# Patient Record
Sex: Male | Born: 1970 | Race: White | Hispanic: No | Marital: Married | Smoking: Never smoker
Health system: Southern US, Community
[De-identification: ages and names within clinical notes are randomized; demographics above are authoritative.]

## PROBLEM LIST (undated history)

## (undated) DIAGNOSIS — M5126 Other intervertebral disc displacement, lumbar region: Secondary | ICD-10-CM

## (undated) DIAGNOSIS — H9190 Unspecified hearing loss, unspecified ear: Secondary | ICD-10-CM

## (undated) DIAGNOSIS — I10 Essential (primary) hypertension: Secondary | ICD-10-CM

## (undated) DIAGNOSIS — I509 Heart failure, unspecified: Secondary | ICD-10-CM

## (undated) DIAGNOSIS — K529 Noninfective gastroenteritis and colitis, unspecified: Secondary | ICD-10-CM

## (undated) DIAGNOSIS — I2510 Atherosclerotic heart disease of native coronary artery without angina pectoris: Secondary | ICD-10-CM

## (undated) DIAGNOSIS — E119 Type 2 diabetes mellitus without complications: Secondary | ICD-10-CM

## (undated) DIAGNOSIS — I219 Acute myocardial infarction, unspecified: Secondary | ICD-10-CM

## (undated) DIAGNOSIS — Z8781 Personal history of (healed) traumatic fracture: Secondary | ICD-10-CM

## (undated) DIAGNOSIS — E785 Hyperlipidemia, unspecified: Secondary | ICD-10-CM

## (undated) DIAGNOSIS — Z860100 Personal history of colon polyps, unspecified: Secondary | ICD-10-CM

## (undated) HISTORY — DX: Hyperlipidemia, unspecified: E78.5

## (undated) HISTORY — PX: CARDIAC CATHETERIZATION: SHX172

## (undated) HISTORY — DX: Personal history of (healed) traumatic fracture: Z87.81

## (undated) HISTORY — PX: NO PAST SURGERIES: SHX2092

## (undated) HISTORY — DX: Personal history of colonic polyps: Z86.010

## (undated) HISTORY — DX: Acute myocardial infarction, unspecified: I21.9

## (undated) HISTORY — DX: Personal history of colon polyps, unspecified: Z86.0100

## (undated) HISTORY — PX: COLONOSCOPY: SHX174

## (undated) HISTORY — DX: Unspecified hearing loss, unspecified ear: H91.90

## (undated) HISTORY — DX: Other intervertebral disc displacement, lumbar region: M51.26

## (undated) HISTORY — DX: Noninfective gastroenteritis and colitis, unspecified: K52.9

---

## 2013-08-04 ENCOUNTER — Other Ambulatory Visit: Payer: Self-pay | Admitting: Family Medicine

## 2013-08-04 ENCOUNTER — Encounter: Payer: Self-pay | Admitting: Family Medicine

## 2013-08-04 ENCOUNTER — Ambulatory Visit (INDEPENDENT_AMBULATORY_CARE_PROVIDER_SITE_OTHER): Payer: BC Managed Care – PPO | Admitting: Family Medicine

## 2013-08-04 VITALS — BP 122/78 | HR 78 | Temp 97.8°F | Resp 18 | Ht 72.0 in | Wt 240.0 lb

## 2013-08-04 DIAGNOSIS — L989 Disorder of the skin and subcutaneous tissue, unspecified: Secondary | ICD-10-CM

## 2013-08-04 DIAGNOSIS — E669 Obesity, unspecified: Secondary | ICD-10-CM

## 2013-08-04 DIAGNOSIS — E785 Hyperlipidemia, unspecified: Secondary | ICD-10-CM

## 2013-08-04 LAB — LIPID PANEL
Cholesterol: 160 mg/dL (ref 0–200)
Total CHOL/HDL Ratio: 4 Ratio
Triglycerides: 155 mg/dL — ABNORMAL HIGH (ref <150)

## 2013-08-04 LAB — COMPREHENSIVE METABOLIC PANEL
AST: 30 U/L (ref 0–37)
Albumin: 4.4 g/dL (ref 3.5–5.2)
Alkaline Phosphatase: 52 U/L (ref 39–117)
BUN: 16 mg/dL (ref 6–23)
CO2: 28 mEq/L (ref 19–32)
Calcium: 9.6 mg/dL (ref 8.4–10.5)
Chloride: 106 mEq/L (ref 96–112)
Creat: 1.25 mg/dL (ref 0.50–1.35)
Glucose, Bld: 103 mg/dL — ABNORMAL HIGH (ref 70–99)
Potassium: 4.5 mEq/L (ref 3.5–5.3)
Sodium: 142 mEq/L (ref 135–145)
Total Protein: 6.7 g/dL (ref 6.0–8.3)

## 2013-08-04 LAB — CBC WITH DIFFERENTIAL/PLATELET
Basophils Relative: 1 % (ref 0–1)
Eosinophils Absolute: 0.1 10*3/uL (ref 0.0–0.7)
Hemoglobin: 14.4 g/dL (ref 13.0–17.0)
Lymphs Abs: 3.2 10*3/uL (ref 0.7–4.0)
MCH: 27.7 pg (ref 26.0–34.0)
MCHC: 34.3 g/dL (ref 30.0–36.0)
MCV: 80.9 fL (ref 78.0–100.0)
Monocytes Relative: 7 % (ref 3–12)
Neutro Abs: 2.2 10*3/uL (ref 1.7–7.7)
Neutrophils Relative %: 37 % — ABNORMAL LOW (ref 43–77)
RBC: 5.19 MIL/uL (ref 4.22–5.81)
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
## 2013-08-04 NOTE — Assessment & Plan Note (Signed)
He has current work out routine , continue to encourage healthy living  Return for CPE, would obtain EKG with risk factors of family, cholesterol and weight

## 2013-08-04 NOTE — Patient Instructions (Addendum)
Release of records previous PCP  We will cal with lab results Continue current medications F/U 6 months for Physical

## 2013-08-04 NOTE — Assessment & Plan Note (Signed)
Benign appearing, scabbed lesion, will monitor has been present many years if any growth or change would biopsy

## 2013-08-04 NOTE — Assessment & Plan Note (Signed)
Continue current meds Check FLP and CMET today 90 day supply to be sent pending results

## 2013-08-05 ENCOUNTER — Encounter: Payer: Self-pay | Admitting: Family Medicine

## 2013-08-05 DIAGNOSIS — R7301 Impaired fasting glucose: Secondary | ICD-10-CM | POA: Insufficient documentation

## 2013-08-05 LAB — HEMOGLOBIN A1C
Hgb A1c MFr Bld: 6.5 % — ABNORMAL HIGH (ref <5.7)
Mean Plasma Glucose: 140 mg/dL — ABNORMAL HIGH (ref <117)

## 2013-08-06 MED ORDER — SIMVASTATIN 10 MG PO TABS: 10.0000 mg | ORAL_TABLET | Freq: Every day | ORAL | Status: DC

## 2013-08-06 MED ORDER — FENOFIBRATE 48 MG PO TABS: 48.0000 mg | ORAL_TABLET | Freq: Every day | ORAL | Status: DC

## 2013-08-06 NOTE — Addendum Note (Signed)
Addended by: Milinda Antis F on: 08/06/2013 01:55 PM   Modules accepted: Orders

## 2013-10-13 ENCOUNTER — Encounter: Payer: Self-pay | Admitting: Family Medicine

## 2013-10-13 ENCOUNTER — Ambulatory Visit (INDEPENDENT_AMBULATORY_CARE_PROVIDER_SITE_OTHER): Payer: BC Managed Care – PPO | Admitting: Family Medicine

## 2013-10-13 VITALS — BP 120/88 | HR 78 | Temp 98.1°F | Resp 18 | Ht 71.0 in | Wt 239.0 lb

## 2013-10-13 DIAGNOSIS — E119 Type 2 diabetes mellitus without complications: Secondary | ICD-10-CM | POA: Insufficient documentation

## 2013-10-13 DIAGNOSIS — R7309 Other abnormal glucose: Secondary | ICD-10-CM

## 2013-10-13 DIAGNOSIS — E785 Hyperlipidemia, unspecified: Secondary | ICD-10-CM

## 2013-10-13 DIAGNOSIS — R7303 Prediabetes: Secondary | ICD-10-CM

## 2013-10-13 LAB — HEMOGLOBIN A1C, FINGERSTICK: Hgb A1C (fingerstick): 5.9 % — ABNORMAL HIGH (ref <5.7)

## 2013-10-13 NOTE — Assessment & Plan Note (Signed)
Triglycerides are much improved. He did have minimal elevation in his ALT are recheck his liver function test today

## 2013-10-13 NOTE — Assessment & Plan Note (Signed)
Repeat A1c was down to 5.9%. He will continue to work on his diet. No medications are needed at this time.

## 2013-10-13 NOTE — Progress Notes (Signed)
   Subjective:    Patient ID: James Francis, male    DOB: 27-May-1971, 42 y.o.   MRN: [id number]  HPI  Patient here to followup blood sugar. He was found to have elevated fasting glucose at her last her last A1c was done which showed 6.5% value. He's been watching his diet and cutting back on carbs and sugar intake. He has no specific concerns today. His triglycerides are much improved at 155. He had minimal elevation in his ALT  Review of Systems - per above  GEN- denies fatigue, fever, weight loss,weakness, recent illness HEENT- denies eye drainage, change in vision, nasal discharge, CVS- denies chest pain, palpitations RESP- denies SOB, cough, wheezery Neuro- denies headache, dizziness, syncope, seizure activity       Objective:   Physical Exam GEN-NAD,alert and oriented x 3        Assessment & Plan:

## 2013-10-13 NOTE — Patient Instructions (Signed)
Continue to watch your diet, low sugar, low carb Your A1C today improved from 6.5% to 5.9% We will send a letter with the liver labs if normal F/U as previous

## 2013-10-14 LAB — COMPREHENSIVE METABOLIC PANEL
ALT: 54 U/L — ABNORMAL HIGH (ref 0–53)
Albumin: 4.4 g/dL (ref 3.5–5.2)
CO2: 27 mEq/L (ref 19–32)
Glucose, Bld: 67 mg/dL — ABNORMAL LOW (ref 70–99)
Potassium: 4.3 mEq/L (ref 3.5–5.3)
Sodium: 139 mEq/L (ref 135–145)
Total Bilirubin: 0.7 mg/dL (ref 0.3–1.2)
Total Protein: 6.5 g/dL (ref 6.0–8.3)

## 2013-10-16 ENCOUNTER — Encounter: Payer: Self-pay | Admitting: *Deleted

## 2014-02-02 ENCOUNTER — Ambulatory Visit (INDEPENDENT_AMBULATORY_CARE_PROVIDER_SITE_OTHER): Payer: 59 | Admitting: Family Medicine

## 2014-02-02 ENCOUNTER — Encounter: Payer: Self-pay | Admitting: Family Medicine

## 2014-02-02 VITALS — BP 136/72 | HR 78 | Temp 97.8°F | Resp 14 | Ht 71.0 in | Wt 239.0 lb

## 2014-02-02 DIAGNOSIS — E669 Obesity, unspecified: Secondary | ICD-10-CM

## 2014-02-02 DIAGNOSIS — Z0000 Encounter for general adult medical examination without abnormal findings: Secondary | ICD-10-CM

## 2014-02-02 DIAGNOSIS — M25562 Pain in left knee: Secondary | ICD-10-CM | POA: Insufficient documentation

## 2014-02-02 DIAGNOSIS — E785 Hyperlipidemia, unspecified: Secondary | ICD-10-CM

## 2014-02-02 DIAGNOSIS — M25569 Pain in unspecified knee: Secondary | ICD-10-CM

## 2014-02-02 DIAGNOSIS — Z23 Encounter for immunization: Secondary | ICD-10-CM

## 2014-02-02 MED ORDER — FENOFIBRATE 48 MG PO TABS: 48.0000 mg | ORAL_TABLET | Freq: Every day | ORAL | Status: DC

## 2014-02-02 MED ORDER — SIMVASTATIN 10 MG PO TABS: 10.0000 mg | ORAL_TABLET | Freq: Every day | ORAL | Status: DC

## 2014-02-02 NOTE — Patient Instructions (Signed)
I recommend eye visit once a year I recommend dental visit every 6 months Goal is to  Exercise 30 minutes 5 days a week We will send a letter with lab results  Fax me a copy of labs [phone]  TDAP given today  F/U 6 months

## 2014-02-02 NOTE — Assessment & Plan Note (Signed)
No red flags, have him take OTC NSAIDS, hold on imaging unless no improvement

## 2014-02-02 NOTE — Assessment & Plan Note (Signed)
Obtain labs from his biometric screening

## 2014-02-02 NOTE — Assessment & Plan Note (Signed)
Continue to work on diet and exercise
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
## 2014-08-05 ENCOUNTER — Ambulatory Visit: Payer: 59 | Admitting: Family Medicine

## 2014-08-12 ENCOUNTER — Ambulatory Visit (INDEPENDENT_AMBULATORY_CARE_PROVIDER_SITE_OTHER): Payer: 59 | Admitting: Family Medicine

## 2014-08-12 ENCOUNTER — Encounter: Payer: Self-pay | Admitting: Family Medicine

## 2014-08-12 VITALS — BP 126/72 | HR 64 | Temp 98.0°F | Resp 12 | Ht 72.0 in | Wt 238.0 lb

## 2014-08-12 DIAGNOSIS — E785 Hyperlipidemia, unspecified: Secondary | ICD-10-CM

## 2014-08-12 DIAGNOSIS — M5126 Other intervertebral disc displacement, lumbar region: Secondary | ICD-10-CM

## 2014-08-12 DIAGNOSIS — M5136 Other intervertebral disc degeneration, lumbar region: Secondary | ICD-10-CM | POA: Insufficient documentation

## 2014-08-12 DIAGNOSIS — B351 Tinea unguium: Secondary | ICD-10-CM | POA: Insufficient documentation

## 2014-08-12 DIAGNOSIS — R7303 Prediabetes: Secondary | ICD-10-CM

## 2014-08-12 DIAGNOSIS — R7309 Other abnormal glucose: Secondary | ICD-10-CM

## 2014-08-12 LAB — LIPID PANEL
CHOLESTEROL: 184 mg/dL (ref 0–200)
HDL: 47 mg/dL (ref >39)
LDL Cholesterol: 76 mg/dL (ref 0–99)
Total CHOL/HDL Ratio: 3.9 Ratio
Triglycerides: 303 mg/dL — ABNORMAL HIGH (ref <150)
VLDL: 61 mg/dL — ABNORMAL HIGH (ref 0–40)

## 2014-08-12 LAB — CBC WITH DIFFERENTIAL/PLATELET
BASOS ABS: 0 10*3/uL (ref 0.0–0.1)
Basophils Relative: 0 % (ref 0–1)
Eosinophils Absolute: 0.1 10*3/uL (ref 0.0–0.7)
Eosinophils Relative: 2 % (ref 0–5)
HEMATOCRIT: 42.6 % (ref 39.0–52.0)
Hemoglobin: 14.4 g/dL (ref 13.0–17.0)
LYMPHS PCT: 51 % — AB (ref 12–46)
Lymphs Abs: 3.5 10*3/uL (ref 0.7–4.0)
MCH: 28 pg (ref 26.0–34.0)
MCHC: 33.8 g/dL (ref 30.0–36.0)
MCV: 82.7 fL (ref 78.0–100.0)
MONO ABS: 0.5 10*3/uL (ref 0.1–1.0)
Monocytes Relative: 7 % (ref 3–12)
Neutro Abs: 2.7 10*3/uL (ref 1.7–7.7)
Neutrophils Relative %: 40 % — ABNORMAL LOW (ref 43–77)
PLATELETS: 202 10*3/uL (ref 150–400)
RBC: 5.15 MIL/uL (ref 4.22–5.81)
RDW: 14.3 % (ref 11.5–15.5)
WBC: 6.8 10*3/uL (ref 4.0–10.5)

## 2014-08-12 LAB — COMPREHENSIVE METABOLIC PANEL
ALT: 28 U/L (ref 0–53)
AST: 21 U/L (ref 0–37)
Albumin: 4.3 g/dL (ref 3.5–5.2)
Alkaline Phosphatase: 47 U/L (ref 39–117)
BUN: 18 mg/dL (ref 6–23)
CALCIUM: 9.8 mg/dL (ref 8.4–10.5)
CO2: 27 mEq/L (ref 19–32)
CREATININE: 1.24 mg/dL (ref 0.50–1.35)
Chloride: 103 mEq/L (ref 96–112)
Glucose, Bld: 98 mg/dL (ref 70–99)
POTASSIUM: 4.6 meq/L (ref 3.5–5.3)
Sodium: 140 mEq/L (ref 135–145)
Total Bilirubin: 0.7 mg/dL (ref 0.2–1.2)
Total Protein: 6.5 g/dL (ref 6.0–8.3)

## 2014-08-12 LAB — HEMOGLOBIN A1C
HEMOGLOBIN A1C: 6.4 % — AB (ref <5.7)
MEAN PLASMA GLUCOSE: 137 mg/dL — AB (ref <117)

## 2014-08-12 MED ORDER — TERBINAFINE HCL 250 MG PO TABS: 250.0000 mg | ORAL_TABLET | Freq: Every day | ORAL | Status: DC

## 2014-08-12 MED ORDER — HYDROCODONE-ACETAMINOPHEN 5-325 MG PO TABS: 1.0000 | ORAL_TABLET | Freq: Four times a day (QID) | ORAL | Status: DC | PRN

## 2014-08-12 NOTE — Assessment & Plan Note (Signed)
We'll treat with terbinafine 250 mg once a day he will followup in 6 weeks as he is also on a statin drug so this does not  Azole his liver needs to be followed very closely

## 2014-08-12 NOTE — Assessment & Plan Note (Signed)
Recheck A1c with fasting lab

## 2014-08-12 NOTE — Progress Notes (Signed)
Patient ID: James Francis, male   DOB: 04/10/1971, 43 y.o.   MRN: [id number]   Subjective:    Patient ID: James HailLeonard Muchow, male    DOB: 06/20/1971, 43 y.o.   MRN: [id number]  Patient presents for 6 Month F/U, Thickening Toenails and Back Injury  Patient here to follow chronic medical problems. He is concerned about thickening and pleural great toenails he's had this for a few years but never had it treated and would like to try treatment for her. He denies any pain in his toe nails or his feet.  Chronic back pain he is history of bulging disc at L3-L4 which is chronic. This is been [place] He's been taking ibuprofen as he tweaked his back 2 weeks ago he is also using heat and ice. He's tried muscle relaxants a past they do not help he does use pain medication on a rare occasion when his back flares up. He denies any change in bowel bladder and no paresthesia in  in the lower ext  Flu shot at his work Review Of Systems:  GEN- denies fatigue, fever, weight loss,weakness, recent illness HEENT- denies eye drainage, change in vision, nasal discharge, CVS- denies chest pain, palpitations RESP- denies SOB, cough, wheeze ABD- denies N/V, change in stools, abd pain GU- denies dysuria, hematuria, dribbling, incontinence MSK- + joint pain, muscle aches, injury Neuro- denies headache, dizziness, syncope, seizure activity       Objective:    BP 126/72  Pulse 64  Temp(Src) 98 F (36.7 C) (Oral)  Resp 12  Ht 6' (1.829 m)  Wt 238 lb (107.956 kg)  BMI 32.27 kg/m2 GEN- NAD, alert and oriented x3 HEENT- PERRL, EOMI, non injected sclera, pink conjunctiva, MMM, oropharynx clear CVS- RRR, no murmur RESP-CTAB EXT- No edema Nails- Bilat great toes- yellow brittle nails approx half of nail with creasing into nail, also thickened discolored 5th nail on right foot Pulses- Radial 2+        Assessment & Plan:      Problem List Items Addressed This Visit   Onychomycosis due to dermatophyte     We'll treat with terbinafine 250 mg once a day he will followup in 6 weeks as he is also on a statin drug so this does not  Azole his liver needs to be followed very closely    Relevant Medications      terbinafine (LAMISIL) 250 MG tablet   Hyperlipidemia     Continue simvastatin and TriCor we check his liver function as well as lipid panel    Relevant Orders      Lipid panel   Glucose intolerance (pre-diabetes) - Primary     Recheck A1c with fasting lab    Relevant Orders      Comprehensive metabolic panel      CBC with Differential      Hemoglobin A1c   Bulging lumbar disc     Chronic back pain, known injury Continue NSAIDS Add norco prn       Note: This dictation was prepared with Dragon dictation along with smaller phrase technology. Any transcriptional errors that result from this process are unintentional.

## 2014-08-12 NOTE — Assessment & Plan Note (Signed)
Chronic back pain, known injury Continue NSAIDS Add norco prn

## 2014-08-12 NOTE — Patient Instructions (Signed)
Pain medicine prescribed We will call with lab results Return in 6 weeks for labs for liver  F/U 6 months for PHYSICAL

## 2014-08-12 NOTE — Assessment & Plan Note (Signed)
Continue simvastatin and TriCor we check his liver function as well as lipid panel

## 2014-09-09 ENCOUNTER — Other Ambulatory Visit: Payer: Self-pay | Admitting: Family Medicine

## 2014-09-30 ENCOUNTER — Encounter: Payer: Self-pay | Admitting: Family Medicine

## 2014-11-03 ENCOUNTER — Other Ambulatory Visit: Payer: 59

## 2014-11-03 DIAGNOSIS — Z79899 Other long term (current) drug therapy: Secondary | ICD-10-CM

## 2014-11-03 DIAGNOSIS — E785 Hyperlipidemia, unspecified: Secondary | ICD-10-CM

## 2014-11-03 LAB — HEPATIC FUNCTION PANEL
ALT: 32 U/L (ref 0–53)
AST: 22 U/L (ref 0–37)
Albumin: 4.2 g/dL (ref 3.5–5.2)
Alkaline Phosphatase: 48 U/L (ref 39–117)
BILIRUBIN TOTAL: 0.5 mg/dL (ref 0.2–1.2)
Bilirubin, Direct: 0.1 mg/dL (ref 0.0–0.3)
Indirect Bilirubin: 0.4 mg/dL (ref 0.2–1.2)
TOTAL PROTEIN: 6.2 g/dL (ref 6.0–8.3)

## 2014-11-03 LAB — LIPID PANEL
Cholesterol: 161 mg/dL (ref 0–200)
HDL: 37 mg/dL — AB (ref >39)
LDL Cholesterol: 70 mg/dL (ref 0–99)
TRIGLYCERIDES: 271 mg/dL — AB (ref <150)
Total CHOL/HDL Ratio: 4.4 Ratio
VLDL: 54 mg/dL — AB (ref 0–40)

## 2014-11-04 ENCOUNTER — Other Ambulatory Visit: Payer: Self-pay | Admitting: *Deleted

## 2014-11-04 MED ORDER — FENOFIBRATE 145 MG PO TABS: 145.0000 mg | ORAL_TABLET | Freq: Every day | ORAL | Status: DC

## 2014-12-08 ENCOUNTER — Encounter: Payer: Self-pay | Admitting: Family Medicine

## 2015-01-02 ENCOUNTER — Other Ambulatory Visit: Payer: Self-pay | Admitting: Family Medicine

## 2015-01-04 NOTE — Telephone Encounter (Signed)
Refill appropriate and filled per protocol. 

## 2015-01-11 ENCOUNTER — Telehealth: Payer: Self-pay | Admitting: Family Medicine

## 2015-01-11 NOTE — Telephone Encounter (Signed)
Patient is calling to say that he believes that he is getting a bill for 173.00 in error, he says he paid this in December would like a call back about this  [phone]

## 2015-01-13 NOTE — Telephone Encounter (Signed)
Spoke with patient and advised him that he had a zero balance since he did make the payment in December.

## 2015-02-01 LAB — LIPID PANEL
CHOLESTEROL: 187 mg/dL (ref 0–200)
HDL: 43 mg/dL (ref 35–70)
LDL Cholesterol: 89 mg/dL
TRIGLYCERIDES: 278 mg/dL — AB (ref 40–160)

## 2015-02-01 LAB — BASIC METABOLIC PANEL: Glucose: 118 mg/dL

## 2015-02-02 ENCOUNTER — Telehealth: Payer: Self-pay | Admitting: Family Medicine

## 2015-02-02 NOTE — Telephone Encounter (Signed)
Patient is calling to say that he has bill of 173.14 that he paid in December, and wants to know why he is still getting a bill for this  [phone]

## 2015-02-09 NOTE — Telephone Encounter (Signed)
Patient hasn't been sent a statement from us since 10/04/14 and his guarantor snap shot shows he has no outstanding balance. I have left msg for patient to return my call so I can advise him of this.

## 2015-02-10 ENCOUNTER — Encounter: Payer: Self-pay | Admitting: Family Medicine

## 2015-02-10 ENCOUNTER — Ambulatory Visit (INDEPENDENT_AMBULATORY_CARE_PROVIDER_SITE_OTHER): Payer: 59 | Admitting: Family Medicine

## 2015-02-10 VITALS — BP 130/76 | HR 64 | Temp 98.2°F | Resp 14 | Ht 72.0 in | Wt 241.0 lb

## 2015-02-10 DIAGNOSIS — E669 Obesity, unspecified: Secondary | ICD-10-CM | POA: Diagnosis not present

## 2015-02-10 DIAGNOSIS — E785 Hyperlipidemia, unspecified: Secondary | ICD-10-CM

## 2015-02-10 DIAGNOSIS — R7303 Prediabetes: Secondary | ICD-10-CM

## 2015-02-10 DIAGNOSIS — R7309 Other abnormal glucose: Secondary | ICD-10-CM | POA: Diagnosis not present

## 2015-02-10 DIAGNOSIS — Z0000 Encounter for general adult medical examination without abnormal findings: Secondary | ICD-10-CM | POA: Diagnosis not present

## 2015-02-10 LAB — CBC WITH DIFFERENTIAL/PLATELET
Basophils Absolute: 0.1 10*3/uL (ref 0.0–0.1)
Basophils Relative: 1 % (ref 0–1)
EOS ABS: 0.2 10*3/uL (ref 0.0–0.7)
Eosinophils Relative: 3 % (ref 0–5)
HCT: 43.2 % (ref 39.0–52.0)
Hemoglobin: 14.5 g/dL (ref 13.0–17.0)
LYMPHS ABS: 3.1 10*3/uL (ref 0.7–4.0)
Lymphocytes Relative: 51 % — ABNORMAL HIGH (ref 12–46)
MCH: 27.6 pg (ref 26.0–34.0)
MCHC: 33.6 g/dL (ref 30.0–36.0)
MCV: 82.1 fL (ref 78.0–100.0)
MPV: 9.5 fL (ref 8.6–12.4)
Monocytes Absolute: 0.4 10*3/uL (ref 0.1–1.0)
Monocytes Relative: 7 % (ref 3–12)
Neutro Abs: 2.3 10*3/uL (ref 1.7–7.7)
Neutrophils Relative %: 38 % — ABNORMAL LOW (ref 43–77)
Platelets: 218 10*3/uL (ref 150–400)
RBC: 5.26 MIL/uL (ref 4.22–5.81)
RDW: 13.8 % (ref 11.5–15.5)
WBC: 6 10*3/uL (ref 4.0–10.5)

## 2015-02-10 LAB — COMPREHENSIVE METABOLIC PANEL
ALT: 54 U/L — ABNORMAL HIGH (ref 0–53)
AST: 33 U/L (ref 0–37)
Albumin: 4.4 g/dL (ref 3.5–5.2)
Alkaline Phosphatase: 46 U/L (ref 39–117)
BUN: 16 mg/dL (ref 6–23)
CALCIUM: 9.6 mg/dL (ref 8.4–10.5)
CHLORIDE: 102 meq/L (ref 96–112)
CO2: 25 mEq/L (ref 19–32)
CREATININE: 1.34 mg/dL (ref 0.50–1.35)
Glucose, Bld: 127 mg/dL — ABNORMAL HIGH (ref 70–99)
POTASSIUM: 4.5 meq/L (ref 3.5–5.3)
SODIUM: 140 meq/L (ref 135–145)
TOTAL PROTEIN: 6.8 g/dL (ref 6.0–8.3)
Total Bilirubin: 0.8 mg/dL (ref 0.2–1.2)

## 2015-02-10 LAB — HEMOGLOBIN A1C
HEMOGLOBIN A1C: 6.9 % — AB (ref <5.7)
Mean Plasma Glucose: 151 mg/dL — ABNORMAL HIGH (ref <117)

## 2015-02-10 MED ORDER — SIMVASTATIN 10 MG PO TABS: 10.0000 mg | ORAL_TABLET | Freq: Every day | ORAL | Status: DC

## 2015-02-10 MED ORDER — FENOFIBRATE 145 MG PO TABS: 145.0000 mg | ORAL_TABLET | Freq: Every day | ORAL | Status: DC

## 2015-02-10 NOTE — Patient Instructions (Addendum)
I recommend eye visit once a year I recommend dental visit every 6 months Goal is to  Exercise 30 minutes 5 days a week We will send a letter with lab results  F/U 6 months  

## 2015-02-10 NOTE — Assessment & Plan Note (Signed)
Recheck A1C, discussed dietary changes and weight loss

## 2015-02-10 NOTE — Assessment & Plan Note (Signed)
He is to continue on the TriCor and the simvastatin and his LDLs actually gone up some and his triglycerides also still quite elevated We were able to get his triglycerides down to almost normal at 155 one year ago at this time he was doing better with his diet

## 2015-02-10 NOTE — Progress Notes (Signed)
Patient ID: James Francis, male   DOB: 05/08/1971, 44 y.o.   MRN: [id number]   Subjective:    Patient ID: James HailLeonard Francis, male    DOB: 06/04/1971, 44 y.o.   MRN: [id number]  Patient presents for CPE  patient here for complete physical exam. He has no specific concerns. He did have a biometric screening done at work his fasting glucose was 118 his lipid panel was done total cholesterol 187 HDL 43 LDL 89 triglycerides 278.  He is taking meds as prescribed TDAP UTD    Review Of Systems:  GEN- denies fatigue, fever, weight loss,weakness, recent illness HEENT- denies eye drainage, change in vision, nasal discharge, CVS- denies chest pain, palpitations RESP- denies SOB, cough, wheeze ABD- denies N/V, change in stools, abd pain GU- denies dysuria, hematuria, dribbling, incontinence MSK- + occ joint pain, muscle aches, injury Neuro- denies headache, dizziness, syncope, seizure activity       Objective:    BP 130/76 mmHg  Pulse 64  Temp(Src) 98.2 F (36.8 C) (Oral)  Resp 14  Ht 6' (1.829 m)  Wt 241 lb (109.317 kg)  BMI 32.68 kg/m2 GEN- NAD, alert and oriented x3 HEENT- PERRL, EOMI, non injected sclera, pink conjunctiva, MMM, oropharynx clear, wears glasses, TM clear bilat Neck- Supple, no LAD CVS- RRR, no murmur RESP-CTAB ABD-NABS,soft,NT,ND EXT- No edema Pulses- Radial, DP- 2+        Assessment & Plan:      Problem List Items Addressed This Visit      Unprioritized   Obesity   Hyperlipidemia    He is to continue on the TriCor and the simvastatin and his LDLs actually gone up some and his triglycerides also still quite elevated We were able to get his triglycerides down to almost normal at 155 one year ago at this time he was doing better with his diet      Relevant Medications   simvastatin (ZOCOR) tablet   Glucose intolerance (pre-diabetes) - Primary    Recheck A1C, discussed dietary changes and weight loss      Relevant Orders   Hemoglobin A1c    Other Visit  Diagnoses    Routine general medical examination at a health care facility        CPE done, immunizations UTD, cholesterol done    Relevant Orders    CBC with Differential/Platelet    Comprehensive metabolic panel       Note: This dictation was prepared with Dragon dictation along with smaller phrase technology. Any transcriptional errors that result from this process are unintentional.

## 2015-02-12 ENCOUNTER — Other Ambulatory Visit: Payer: Self-pay | Admitting: *Deleted

## 2015-02-12 MED ORDER — BLOOD GLUCOSE MONITOR SYSTEM W/DEVICE KIT: PACK | Status: DC

## 2015-02-12 MED ORDER — METFORMIN HCL ER 500 MG PO TB24: 500.0000 mg | ORAL_TABLET | Freq: Every day | ORAL | Status: DC

## 2015-04-09 ENCOUNTER — Telehealth: Payer: Self-pay | Admitting: *Deleted

## 2015-04-09 NOTE — Telephone Encounter (Signed)
Received call from patient.   Reports that he has runny nose with clear drainage and nonproductive cough x1 week. States that he has been using OTC Tylenol Cold. Denies fever, chest congestion, HA, muscle aches, ear/throat pain, or sinus pressure.   Advised to use OTC Mucinex or Robitussin for cough and nasal saline for drainage. Advised that he could also use OTC allergy medication like Claritin or Allegra, or Flonase or Nasocort. Advised to increase rest and to increase fluid intake. Recommended that if symptoms worsen or persist throughout weekend to contact office for visit.

## 2015-04-13 NOTE — Telephone Encounter (Signed)
noted 

## 2015-08-13 ENCOUNTER — Encounter: Payer: Self-pay | Admitting: Family Medicine

## 2015-08-13 ENCOUNTER — Ambulatory Visit (INDEPENDENT_AMBULATORY_CARE_PROVIDER_SITE_OTHER): Payer: 59 | Admitting: Family Medicine

## 2015-08-13 VITALS — BP 128/68 | HR 66 | Temp 98.1°F | Resp 12 | Ht 72.0 in | Wt 234.0 lb

## 2015-08-13 DIAGNOSIS — E785 Hyperlipidemia, unspecified: Secondary | ICD-10-CM

## 2015-08-13 DIAGNOSIS — E669 Obesity, unspecified: Secondary | ICD-10-CM | POA: Diagnosis not present

## 2015-08-13 DIAGNOSIS — R7303 Prediabetes: Secondary | ICD-10-CM

## 2015-08-13 DIAGNOSIS — R059 Cough, unspecified: Secondary | ICD-10-CM

## 2015-08-13 DIAGNOSIS — R7309 Other abnormal glucose: Secondary | ICD-10-CM | POA: Diagnosis not present

## 2015-08-13 DIAGNOSIS — R05 Cough: Secondary | ICD-10-CM

## 2015-08-13 LAB — LIPID PANEL
CHOLESTEROL: 164 mg/dL (ref 125–200)
HDL: 43 mg/dL (ref >=40)
LDL Cholesterol: 85 mg/dL (ref <130)
Total CHOL/HDL Ratio: 3.8 Ratio (ref <=5.0)
Triglycerides: 181 mg/dL — ABNORMAL HIGH (ref <150)
VLDL: 36 mg/dL — AB (ref <30)

## 2015-08-13 LAB — CBC WITH DIFFERENTIAL/PLATELET
Basophils Absolute: 0 10*3/uL (ref 0.0–0.1)
Basophils Relative: 0 % (ref 0–1)
EOS PCT: 2 % (ref 0–5)
Eosinophils Absolute: 0.1 10*3/uL (ref 0.0–0.7)
HEMATOCRIT: 43.4 % (ref 39.0–52.0)
Hemoglobin: 14.7 g/dL (ref 13.0–17.0)
LYMPHS ABS: 3.6 10*3/uL (ref 0.7–4.0)
LYMPHS PCT: 52 % — AB (ref 12–46)
MCH: 28.1 pg (ref 26.0–34.0)
MCHC: 33.9 g/dL (ref 30.0–36.0)
MCV: 83 fL (ref 78.0–100.0)
MONO ABS: 0.4 10*3/uL (ref 0.1–1.0)
MPV: 9.6 fL (ref 8.6–12.4)
Monocytes Relative: 6 % (ref 3–12)
Neutro Abs: 2.8 10*3/uL (ref 1.7–7.7)
Neutrophils Relative %: 40 % — ABNORMAL LOW (ref 43–77)
Platelets: 225 10*3/uL (ref 150–400)
RBC: 5.23 MIL/uL (ref 4.22–5.81)
RDW: 14.2 % (ref 11.5–15.5)
WBC: 7 10*3/uL (ref 4.0–10.5)

## 2015-08-13 LAB — COMPREHENSIVE METABOLIC PANEL
ALK PHOS: 42 U/L (ref 40–115)
ALT: 34 U/L (ref 9–46)
AST: 24 U/L (ref 10–40)
Albumin: 4.6 g/dL (ref 3.6–5.1)
BUN: 18 mg/dL (ref 7–25)
CO2: 25 mmol/L (ref 20–31)
Calcium: 9.7 mg/dL (ref 8.6–10.3)
Chloride: 103 mmol/L (ref 98–110)
Creat: 1.36 mg/dL — ABNORMAL HIGH (ref 0.60–1.35)
GLUCOSE: 120 mg/dL — AB (ref 70–99)
POTASSIUM: 4.6 mmol/L (ref 3.5–5.3)
Sodium: 140 mmol/L (ref 135–146)
Total Bilirubin: 0.6 mg/dL (ref 0.2–1.2)
Total Protein: 6.7 g/dL (ref 6.1–8.1)

## 2015-08-13 NOTE — Assessment & Plan Note (Signed)
Recheck A1C, never started MTF but weight down 7lbs

## 2015-08-13 NOTE — Patient Instructions (Signed)
Continue current medications Trial of zantac or prilosec once a day for 2 weeks for the cough- silent reflux Next would be an anti-histamine- zyrtec, claritin F/U 6 months for Physical

## 2015-08-13 NOTE — Assessment & Plan Note (Signed)
Check labs, on statin and tricor

## 2015-08-13 NOTE — Progress Notes (Signed)
Patient ID: James Francis, male   DOB: [birth year], 44 y.o.   MRN: [id number]   Subjective:    Patient ID: James Francis, male    DOB: February 04, 1971, 44 y.o.   MRN: [id number]  Patient presents for 6 month F/U   patient here for follow-up and medications. He never started the metformin of apical, and her there continue to be some type of mixup with his pharmacy. His weight is down 7 pounds he does not remember changing anything in particular with his diet.   He does note that he has had a cough on and off for the past couple months it typically happens after he eats. He denies any feelings of reflux denies having any allergies. The cough is nonproductive and does not keep him up at night no shortness of breath or other symptoms associated. He is a nonsmoker. He has not tried anything for the cough.  Flu shot at work Review Of Systems:  GEN- denies fatigue, fever, weight loss,weakness, recent illness HEENT- denies eye drainage, change in vision, nasal discharge, CVS- denies chest pain, palpitations RESP- denies SOB, +cough, wheeze ABD- denies N/V, change in stools, abd pain GU- denies dysuria, hematuria, dribbling, incontinence MSK- denies joint pain, muscle aches, injury Neuro- denies headache, dizziness, syncope, seizure activity       Objective:    BP 128/68 mmHg  Pulse 66  Temp(Src) 98.1 F (36.7 C) (Oral)  Resp 12  Ht 6' (1.829 m)  Wt 234 lb (106.142 kg)  BMI 31.73 kg/m2 GEN- NAD, alert and oriented x3 HEENT- PERRL, EOMI, non injected sclera, pink conjunctiva, MMM, oropharynx clear, nares clear, TM clear bilat  Neck- Supple, no LAD CVS- RRR, no murmur RESP-CTAB EXT- No edema Pulses- Radial 2+        Assessment & Plan:      Problem List Items Addressed This Visit    Obesity   Hyperlipidemia - Primary    Check labs, on statin and tricor      Relevant Orders   Lipid panel   Glucose intolerance (pre-diabetes)    Recheck A1C, never started MTF but weight down 7lbs        Relevant Orders   CBC with Differential/Platelet   Comprehensive metabolic panel   Hemoglobin A1c    Other Visit Diagnoses    Cough        Differntials, silent reflux, or allergies. Non smoker, advised to give trial of OTC acid reflux medication first, if no improvement try anti-histamine       Note: This dictation was prepared with Dragon dictation along with smaller phrase technology. Any transcriptional errors that result from this process are unintentional.

## 2015-08-14 LAB — HEMOGLOBIN A1C
Hgb A1c MFr Bld: 6.6 % — ABNORMAL HIGH (ref <5.7)
Mean Plasma Glucose: 143 mg/dL — ABNORMAL HIGH (ref <117)

## 2015-08-20 ENCOUNTER — Encounter: Payer: Self-pay | Admitting: *Deleted

## 2016-02-14 ENCOUNTER — Ambulatory Visit (INDEPENDENT_AMBULATORY_CARE_PROVIDER_SITE_OTHER): Payer: Self-pay | Admitting: Family Medicine

## 2016-02-14 ENCOUNTER — Encounter: Payer: Self-pay | Admitting: Family Medicine

## 2016-02-14 VITALS — BP 128/64 | HR 70 | Temp 98.9°F | Resp 14 | Ht 72.0 in | Wt 241.0 lb

## 2016-02-14 DIAGNOSIS — Z0000 Encounter for general adult medical examination without abnormal findings: Secondary | ICD-10-CM

## 2016-02-14 DIAGNOSIS — R1310 Dysphagia, unspecified: Secondary | ICD-10-CM | POA: Diagnosis not present

## 2016-02-14 DIAGNOSIS — E785 Hyperlipidemia, unspecified: Secondary | ICD-10-CM

## 2016-02-14 DIAGNOSIS — R7303 Prediabetes: Secondary | ICD-10-CM

## 2016-02-14 DIAGNOSIS — E669 Obesity, unspecified: Secondary | ICD-10-CM

## 2016-02-14 LAB — COMPREHENSIVE METABOLIC PANEL
ALK PHOS: 42 U/L (ref 40–115)
ALT: 32 U/L (ref 9–46)
AST: 20 U/L (ref 10–40)
Albumin: 4.2 g/dL (ref 3.6–5.1)
BILIRUBIN TOTAL: 0.6 mg/dL (ref 0.2–1.2)
BUN: 21 mg/dL (ref 7–25)
CO2: 27 mmol/L (ref 20–31)
Calcium: 9 mg/dL (ref 8.6–10.3)
Chloride: 104 mmol/L (ref 98–110)
Creat: 1.21 mg/dL (ref 0.60–1.35)
Glucose, Bld: 126 mg/dL — ABNORMAL HIGH (ref 70–99)
Potassium: 4.5 mmol/L (ref 3.5–5.3)
SODIUM: 139 mmol/L (ref 135–146)
Total Protein: 6.1 g/dL (ref 6.1–8.1)

## 2016-02-14 LAB — CBC WITH DIFFERENTIAL/PLATELET
BASOS PCT: 1 %
Basophils Absolute: 64 cells/uL (ref 0–200)
EOS ABS: 128 {cells}/uL (ref 15–500)
EOS PCT: 2 %
HCT: 42.6 % (ref 38.5–50.0)
Hemoglobin: 14.2 g/dL (ref 13.0–17.0)
LYMPHS PCT: 51 %
Lymphs Abs: 3264 cells/uL (ref 850–3900)
MCH: 28.1 pg (ref 27.0–33.0)
MCHC: 33.3 g/dL (ref 32.0–36.0)
MCV: 84.2 fL (ref 80.0–100.0)
MONOS PCT: 7 %
MPV: 9.9 fL (ref 7.5–12.5)
Monocytes Absolute: 448 cells/uL (ref 200–950)
Neutro Abs: 2496 cells/uL (ref 1500–7800)
Neutrophils Relative %: 39 %
PLATELETS: 217 10*3/uL (ref 140–400)
RBC: 5.06 MIL/uL (ref 4.20–5.80)
RDW: 14 % (ref 11.0–15.0)
WBC: 6.4 10*3/uL (ref 3.8–10.8)

## 2016-02-14 LAB — HEMOGLOBIN A1C
HEMOGLOBIN A1C: 7.4 % — AB (ref <5.7)
MEAN PLASMA GLUCOSE: 166 mg/dL

## 2016-02-14 NOTE — Patient Instructions (Addendum)
I recommend eye visit once a year I recommend dental visit every 6 months Goal is to  Exercise 30 minutes 5 days a week We will send a letter with lab results  Referral to GI for swallowing  F/u 6 months

## 2016-02-14 NOTE — Progress Notes (Signed)
Patient ID: James Francis, male   DOB: 11/14/1970, 45 y.o.   MRN: [id number]    Subjective:    Patient ID: James HailLeonard Francis, male    DOB: 02/07/1971, 45 y.o.   MRN: [id number]  Patient presents for CPE Here for complete physical exam, biometric screening done at work. He is currently being treated for hyperlipidemia he also has glucose intolerance Family history reviewed Immunizations are up-to-date Weight at home ranges 230-235lbs , he is exercising and watching diet UTD Eye doctor and dentist  He continues to have cough after eating, he did 6 month trial of antacids with no improvement. No seasonal allergies, non smoker. No emesis    Biometric Screening TC 161, HDL 42, TG 176, LDL 84  Random glucose 122   Review Of Systems:  GEN- denies fatigue, fever, weight loss,weakness, recent illness HEENT- denies eye drainage, change in vision, nasal discharge, CVS- denies chest pain, palpitations RESP- denies SOB, cough, wheeze ABD- denies N/V, change in stools, abd pain GU- denies dysuria, hematuria, dribbling, incontinence MSK- denies joint pain, muscle aches, injury Neuro- denies headache, dizziness, syncope, seizure activity       Objective:    BP 128/64 mmHg  Pulse 70  Temp(Src) 98.9 F (37.2 C) (Oral)  Resp 14  Ht 6' (1.829 m)  Wt 241 lb (109.317 kg)  BMI 32.68 kg/m2 GEN- NAD, alert and oriented x3 HEENT- PERRL, EOMI, non injected sclera, pink conjunctiva, MMM, oropharynx clear Neck- Supple, no thyromegaly CVS- RRR, no murmur RESP-CTAB ABD-NABS,soft,NT,ND EXT- No edema Pulses- Radial, DP- 2+        Assessment & Plan:      Problem List Items Addressed This Visit    Prediabetes   Relevant Orders   Hemoglobin A1c   Obesity   Hyperlipidemia   Relevant Orders   Comprehensive metabolic panel    Other Visit Diagnoses    Routine general medical examination at a health care facility    -  Primary    CPE done, obtain A1C for glucose intolerance, Cholesterol at goal.,  Immunizations UTD, check LFT on fibrate and statin. COntinue with exercise     Relevant Orders    Comprehensive metabolic panel    CBC with Differential/Platelet    Dysphagia        Cough after eating, no change to GERD treatment, refer to GI for Endoscopy and recommendations        Note: This dictation was prepared with Dragon dictation along with smaller phrase technology. Any transcriptional errors that result from this process are unintentional.

## 2016-02-16 ENCOUNTER — Other Ambulatory Visit: Payer: Self-pay | Admitting: *Deleted

## 2016-02-16 ENCOUNTER — Encounter: Payer: Self-pay | Admitting: *Deleted

## 2016-02-16 MED ORDER — METFORMIN HCL 500 MG PO TABS: 500.0000 mg | ORAL_TABLET | Freq: Every day | ORAL | Status: DC

## 2016-02-16 MED ORDER — BLOOD GLUCOSE TEST VI STRP: ORAL_STRIP | Status: DC

## 2016-02-16 MED ORDER — LANCET DEVICES MISC
Status: DC
Start: 2016-02-16 — End: 2016-05-31

## 2016-02-16 MED ORDER — LANCETS MISC: Status: DC

## 2016-02-16 MED ORDER — BLOOD GLUCOSE SYSTEM PAK KIT: PACK | Status: DC

## 2016-04-16 ENCOUNTER — Other Ambulatory Visit: Payer: Self-pay | Admitting: Family Medicine

## 2016-05-16 ENCOUNTER — Other Ambulatory Visit: Payer: Self-pay | Admitting: Nurse Practitioner

## 2016-05-16 ENCOUNTER — Emergency Department (HOSPITAL_COMMUNITY)
Admission: EM | Admit: 2016-05-16 | Discharge: 2016-05-16 | Disposition: A | Discharge status: Home / Self Care | Payer: 59 | Attending: Emergency Medicine | Admitting: Emergency Medicine

## 2016-05-16 ENCOUNTER — Encounter (HOSPITAL_COMMUNITY): Payer: Self-pay | Admitting: Emergency Medicine

## 2016-05-16 DIAGNOSIS — R7989 Other specified abnormal findings of blood chemistry: Secondary | ICD-10-CM | POA: Diagnosis not present

## 2016-05-16 DIAGNOSIS — W1830XA Fall on same level, unspecified, initial encounter: Secondary | ICD-10-CM | POA: Insufficient documentation

## 2016-05-16 DIAGNOSIS — Z7984 Long term (current) use of oral hypoglycemic drugs: Secondary | ICD-10-CM | POA: Insufficient documentation

## 2016-05-16 DIAGNOSIS — Y929 Unspecified place or not applicable: Secondary | ICD-10-CM | POA: Diagnosis not present

## 2016-05-16 DIAGNOSIS — Y999 Unspecified external cause status: Secondary | ICD-10-CM | POA: Insufficient documentation

## 2016-05-16 DIAGNOSIS — R55 Syncope and collapse: Secondary | ICD-10-CM

## 2016-05-16 DIAGNOSIS — Y9355 Activity, bike riding: Secondary | ICD-10-CM | POA: Diagnosis not present

## 2016-05-16 DIAGNOSIS — S0181XA Laceration without foreign body of other part of head, initial encounter: Secondary | ICD-10-CM | POA: Insufficient documentation

## 2016-05-16 LAB — COMPREHENSIVE METABOLIC PANEL
ALBUMIN: 3.7 g/dL (ref 3.5–5.0)
ALK PHOS: 34 U/L — AB (ref 38–126)
ALT: 27 U/L (ref 17–63)
ANION GAP: 7 (ref 5–15)
AST: 25 U/L (ref 15–41)
BUN: 18 mg/dL (ref 6–20)
CALCIUM: 8.8 mg/dL — AB (ref 8.9–10.3)
CHLORIDE: 109 mmol/L (ref 101–111)
CO2: 24 mmol/L (ref 22–32)
Creatinine, Ser: 1.38 mg/dL — ABNORMAL HIGH (ref 0.61–1.24)
GFR calc Af Amer: >60 mL/min (ref >60)
GFR calc non Af Amer: >60 mL/min (ref >60)
GLUCOSE: 119 mg/dL — AB (ref 65–99)
Potassium: 4.3 mmol/L (ref 3.5–5.1)
SODIUM: 140 mmol/L (ref 135–145)
Total Bilirubin: 0.6 mg/dL (ref 0.3–1.2)
Total Protein: 5.9 g/dL — ABNORMAL LOW (ref 6.5–8.1)

## 2016-05-16 LAB — CBC WITH DIFFERENTIAL/PLATELET
BASOS PCT: 0 %
Basophils Absolute: 0 10*3/uL (ref 0.0–0.1)
EOS ABS: 0.1 10*3/uL (ref 0.0–0.7)
Eosinophils Relative: 1 %
HCT: 42 % (ref 39.0–52.0)
HEMOGLOBIN: 13.6 g/dL (ref 13.0–17.0)
Lymphocytes Relative: 48 %
Lymphs Abs: 3.2 10*3/uL (ref 0.7–4.0)
MCH: 28 pg (ref 26.0–34.0)
MCHC: 32.4 g/dL (ref 30.0–36.0)
MCV: 86.6 fL (ref 78.0–100.0)
Monocytes Absolute: 0.4 10*3/uL (ref 0.1–1.0)
Monocytes Relative: 6 %
NEUTROS PCT: 45 %
Neutro Abs: 3 10*3/uL (ref 1.7–7.7)
PLATELETS: 200 10*3/uL (ref 150–400)
RBC: 4.85 MIL/uL (ref 4.22–5.81)
RDW: 12.9 % (ref 11.5–15.5)
WBC: 6.7 10*3/uL (ref 4.0–10.5)

## 2016-05-16 LAB — I-STAT TROPONIN, ED
Troponin i, poc: 0 ng/mL (ref 0.00–0.08)
Troponin i, poc: 0 ng/mL (ref 0.00–0.08)

## 2016-05-16 LAB — CK: CK TOTAL: 145 U/L (ref 49–397)

## 2016-05-16 MED ORDER — SODIUM CHLORIDE 0.9 % IV BOLUS (SEPSIS)
1000.0000 mL | Freq: Once | INTRAVENOUS | Status: AC
  Administered 2016-05-16: 1000 mL via INTRAVENOUS

## 2016-05-16 MED ORDER — TETANUS-DIPHTH-ACELL PERTUSSIS 5-2.5-18.5 LF-MCG/0.5 IM SUSP
0.5000 mL | Freq: Once | INTRAMUSCULAR | Status: AC
  Administered 2016-05-16: 0.5 mL via INTRAMUSCULAR
  Filled 2016-05-16: qty 0.5

## 2016-05-16 MED ORDER — LIDOCAINE HCL 2 % IJ SOLN
10.0000 mL | Freq: Once | INTRAMUSCULAR | Status: AC
  Administered 2016-05-16: 200 mg via INTRADERMAL
  Filled 2016-05-16: qty 20

## 2016-05-16 MED ORDER — BACITRACIN ZINC 500 UNIT/GM EX OINT
TOPICAL_OINTMENT | Freq: Two times a day (BID) | CUTANEOUS | Status: DC
  Administered 2016-05-16: 1 via TOPICAL

## 2016-05-16 NOTE — Discharge Instructions (Signed)
Make sure to stay hydrated. No strenuous activity until cleared. Cardiology group will call you with an apt tomorrow. If do not hear from them by Thursday, call their office. Return if worsening symptoms.    Syncope Syncope is a medical term for fainting or passing out. This means you lose consciousness and drop to the ground. People are generally unconscious for less than 5 minutes. You may have some muscle twitches for up to 15 seconds before waking up and returning to normal. Syncope occurs more often in older adults, but it can happen to anyone. While most causes of syncope are not dangerous, syncope can be a sign of a serious medical problem. It is important to seek medical care.  CAUSES  Syncope is caused by a sudden drop in blood flow to the brain. The specific cause is often not determined. Factors that can bring on syncope include:  Taking medicines that lower blood pressure.  Sudden changes in posture, such as standing up quickly.  Taking more medicine than prescribed.  Standing in one place for too long.  Seizure disorders.  Dehydration and excessive exposure to heat.  Low blood sugar (hypoglycemia).  Straining to have a bowel movement.  Heart disease, irregular heartbeat, or other circulatory problems.  Fear, emotional distress, seeing blood, or severe pain. SYMPTOMS  Right before fainting, you may:  Feel dizzy or light-headed.  Feel nauseous.  See all white or all black in your field of vision.  Have cold, clammy skin. DIAGNOSIS  Your health care provider will ask about your symptoms, perform a physical exam, and perform an electrocardiogram (ECG) to record the electrical activity of your heart. Your health care provider may also perform other heart or blood tests to determine the cause of your syncope which may include:  Transthoracic echocardiogram (TTE). During echocardiography, sound waves are used to evaluate how blood flows through your  heart.  Transesophageal echocardiogram (TEE).  Cardiac monitoring. This allows your health care provider to monitor your heart rate and rhythm in real time.  Holter monitor. This is a portable device that records your heartbeat and can help diagnose heart arrhythmias. It allows your health care provider to track your heart activity for several days, if needed.  Stress tests by exercise or by giving medicine that makes the heart beat faster. TREATMENT  In most cases, no treatment is needed. Depending on the cause of your syncope, your health care provider may recommend changing or stopping some of your medicines. HOME CARE INSTRUCTIONS  Have someone stay with you until you feel stable.  Do not drive, use machinery, or play sports until your health care provider says it is okay.  Keep all follow-up appointments as directed by your health care provider.  Lie down right away if you start feeling like you might faint. Breathe deeply and steadily. Wait until all the symptoms have passed.  Drink enough fluids to keep your urine clear or pale yellow.  If you are taking blood pressure or heart medicine, get up slowly and take several minutes to sit and then stand. This can reduce dizziness. SEEK IMMEDIATE MEDICAL CARE IF:   You have a severe headache.  You have unusual pain in the chest, abdomen, or back.  You are bleeding from your mouth or rectum, or you have black or tarry stool.  You have an irregular or very fast heartbeat.  You have pain with breathing.  You have repeated fainting or seizure-like jerking during an episode.  You faint when  sitting or lying down.  You have confusion.  You have trouble walking.  You have severe weakness.  You have vision problems. If you fainted, call your local emergency services (911 in [place] Do not drive yourself to the hospital.    This information is not intended to replace advice given to you by your health care provider. Make sure  you discuss any questions you have with your health care provider.   Document Released: 10/30/2005 Document Revised: 03/16/2015 Document Reviewed: 12/29/2011 Elsevier Interactive Patient Education [employer]2016 Elsevier Inc.

## 2016-05-16 NOTE — ED Notes (Signed)
Pt reports feeling nauseated prior to syncopal episode. Denies dizziness. Denies chest pain. Denies shortness of breath.

## 2016-05-16 NOTE — ED Provider Notes (Signed)
CSN: [id number]     Arrival date & time 05/16/16  8466 History   First MD Initiated Contact with Patient 05/16/16 [phone]     Chief Complaint  Patient presents with  . Loss of Consciousness     (Consider location/radiation/quality/duration/timing/severity/associated sxs/prior Treatment) HPI James Francis is a 45 y.o. male with history of hyperlipidemia, presents to emergency department after syncopal episode. Patient states he was biking, which he normally does several times a week. He states he finished biking approximately 12 miles when he started not feeling well. He states "felt weak and was cramping all over." He stopped by fire station to rest and to use the bathroom when he states he walked just a few steps and then woke up on the floor. Patient denies feeling dizzy or lightheaded prior to syncope. He denies any chest pain or palpitations. He denies any prior syncopal episode. per EMS, patient was orthostatic upon their arrival. Patient received 1 L of lactated Ringer solution on the way to the hospital. Patient states he was feeling normal prior to his ride this morning. Patient is currently asymptomatic and is only complaining of a laceration to the chin.  Past Medical History  Diagnosis Date  . Hyperlipidemia   . Hearing loss     High frequency bilateral  . Colitis     Indeterminate cause  . Ruptured lumbar disc     L4-L5 as a child  . History of broken nose     Multiple- has deviated septum   History reviewed. No pertinent past surgical history. Family History  Problem Relation Age of Onset  . Cancer Mother     unknown type  . Kidney Stones Mother   . Heart disease Father     MI age 65, triple Bypass/ death age 70  . Cancer Maternal Grandmother     Ovarian  . Heart disease Paternal Grandfather     MI  . Diabetes Paternal Grandfather    Social History  Substance Use Topics  . Smoking status: Never Smoker   . Smokeless tobacco: Never Used  . Alcohol Use: 0.0 oz/week   0 Standard drinks or equivalent per week     Comment: occasionally     Review of Systems  Constitutional: Negative for fever and chills.  Respiratory: Negative for cough, chest tightness and shortness of breath.   Cardiovascular: Negative for chest pain, palpitations and leg swelling.  Gastrointestinal: Negative for nausea, vomiting, abdominal pain, diarrhea and abdominal distention.  Genitourinary: Negative for dysuria, urgency, frequency and hematuria.  Musculoskeletal: Negative for myalgias, arthralgias, neck pain and neck stiffness.  Skin: Positive for wound. Negative for rash.  Allergic/Immunologic: Negative for immunocompromised state.  Neurological: Positive for syncope. Negative for dizziness, weakness, light-headedness, numbness and headaches.  All other systems reviewed and are negative.     Allergies  Review of patient's allergies indicates no known allergies.  Home Medications   Prior to Admission medications   Medication Sig Start Date End Date Taking? Authorizing Provider  Blood Glucose Monitoring Suppl (BLOOD GLUCOSE SYSTEM PAK) KIT Please dispense based on patient and insurance preference. Use as directed to monitor FSBS 3x weekly. Dx: R73.09. 02/16/16   Alycia Rossetti, MD  fenofibrate (TRICOR) 145 MG tablet TAKE 1 TABLET DAILY 04/17/16   Alycia Rossetti, MD  Glucose Blood (BLOOD GLUCOSE TEST STRIPS) STRP Please dispense based on patient and insurance preference. Use as directed to monitor FSBS 3x weekly. Dx: R73.09. 02/16/16   Alycia Rossetti, MD  Lancet Devices MISC Please dispense based on patient and insurance preference. Use as directed to monitor FSBS 3x weekly. Dx: R73.09. 02/16/16   Alycia Rossetti, MD  Lancets MISC Please dispense based on patient and insurance preference. Use as directed to monitor FSBS 3x weekly. Dx: R73.09. 02/16/16   Alycia Rossetti, MD  metFORMIN (GLUCOPHAGE) 500 MG tablet Take 1 tablet (500 mg total) by mouth daily with breakfast. 02/16/16    Alycia Rossetti, MD  Multiple Vitamin (MULTIVITAMIN) capsule Take 1 capsule by mouth daily.    Historical Provider, MD  Omega-3 Fatty Acids (FISH OIL) 1000 MG CAPS Take 1,000 mg by mouth daily.    Historical Provider, MD  simvastatin (ZOCOR) 10 MG tablet Take 1 tablet (10 mg total) by mouth at bedtime. 02/10/15   Alycia Rossetti, MD   BP 116/72 mmHg  Pulse 66  Temp(Src) 98.2 F (36.8 C) (Oral)  Resp 16  SpO2 99% Physical Exam  Constitutional: He is oriented to person, place, and time. He appears well-developed and well-nourished. No distress.  HENT:  Head: Normocephalic and atraumatic.  3 cm laceration to the chin, gaping, hemostatic  Eyes: Conjunctivae and EOM are normal. Pupils are equal, round, and reactive to light.  Neck: Normal range of motion. Neck supple.  Cardiovascular: Normal rate, regular rhythm and normal heart sounds.   Pulmonary/Chest: Effort normal and breath sounds normal. No respiratory distress. He has no wheezes. He has no rales.  Abdominal: Soft. Bowel sounds are normal. He exhibits no distension. There is no tenderness. There is no rebound.  Musculoskeletal: He exhibits no edema.  Neurological: He is alert and oriented to person, place, and time.  Skin: Skin is warm and dry.  Nursing note and vitals reviewed.   ED Course  Procedures (including critical care time) Labs Review Labs Reviewed  CBC WITH DIFFERENTIAL/PLATELET  COMPREHENSIVE METABOLIC PANEL  CK  I-STAT TROPOININ, ED    Imaging Review No results found. I have personally reviewed and evaluated these images and lab results as part of my medical decision-making.   EKG Interpretation None      LACERATION REPAIR Performed by: Renold Genta Authorized by: Jeannett Senior A Consent: Verbal consent obtained. Risks and benefits: risks, benefits and alternatives were discussed Consent given by: patient Patient identity confirmed: provided demographic data Prepped and Draped in  normal sterile fashion Wound explored  Laceration Location: chin  Laceration Length: 4cm  No Foreign Bodies seen or palpated  Anesthesia: local infiltration  Local anesthetic: lidocaine 2% wo epinephrine  Anesthetic total: 3 ml  Irrigation method: syringe Amount of cleaning: standard  Skin closure: prolene 6.0  Number of sutures: 5  Technique: simple interrupted  Patient tolerance: Patient tolerated the procedure well with no immediate complications.  MDM   Final diagnoses:  Syncope, unspecified syncope type  Elevated serum creatinine   Patient in emergency department after syncopal episode with no prodrome. He did just finish riding his bicycle approximately 12 miles and states he stopped at the fire station to use bathroom because he was not feeling well and cramping. Patient is currently asymptomatic. Normal vital signs. Sinus rhythm on the monitor. Will get labs, EKG, will administer more fluids.  11:54 AM Creatinine is slightly elevated. Patient hydrated. He continues to be symptomatic. Laceration repaired. Discussed case with cardiology, who did not think that he necessarily needed to be admitted today. He will have patient follow-up in the office within a week for echo and recheck.  1:12 PM  Delta trop is 0.00. Pt still asymptomatic. Feeling well. Ambulated. VS normal. Plan to dc home with cardiology follow up. Return precuations discussed.   Filed Vitals:   05/16/16 1100 05/16/16 1130 05/16/16 1200 05/16/16 1313  BP: 114/71 124/81 123/66 116/72  Pulse: 68 65 61 58  Temp:    98.1 F (36.7 C)  TempSrc:    Oral  Resp: _0 SpO2: 99% 100% 98% 100%     Jeannett Senior, PA-C 05/16/16 [place], MD 05/16/16 [phone]

## 2016-05-16 NOTE — ED Notes (Signed)
Pt to ER BIB [facility] after patient experienced a witness syncopal episode at the fire department. Prior to episode patient had just finished biking 13 miles and was stopping to use the restroom. Fire witnessed syncopal episode, pt did hit his chin on the corner of a desk, small laceration noted, bleeding controlled. EMS reports positive orthostatic changes and peaked t-waves on EKG. Pt received 1 L LR in route. Pt reports drinking 16 oz of water prior to exercising this morning but did not eat. Reports he has felt his normal self today. Pt is alert and oriented x4. NAD.

## 2016-05-22 ENCOUNTER — Other Ambulatory Visit: Payer: Self-pay

## 2016-05-22 ENCOUNTER — Ambulatory Visit (HOSPITAL_COMMUNITY): Payer: 59 | Attending: Cardiology

## 2016-05-22 DIAGNOSIS — R55 Syncope and collapse: Secondary | ICD-10-CM | POA: Diagnosis present

## 2016-05-22 DIAGNOSIS — I351 Nonrheumatic aortic (valve) insufficiency: Secondary | ICD-10-CM | POA: Insufficient documentation

## 2016-05-22 DIAGNOSIS — I77810 Thoracic aortic ectasia: Secondary | ICD-10-CM | POA: Insufficient documentation

## 2016-05-22 DIAGNOSIS — I517 Cardiomegaly: Secondary | ICD-10-CM | POA: Diagnosis not present

## 2016-05-29 ENCOUNTER — Other Ambulatory Visit: Payer: Self-pay | Admitting: Family Medicine

## 2016-05-29 NOTE — Telephone Encounter (Signed)
Refill appropriate and filled per protocol. 

## 2016-05-31 ENCOUNTER — Encounter (INDEPENDENT_AMBULATORY_CARE_PROVIDER_SITE_OTHER): Payer: Self-pay

## 2016-05-31 ENCOUNTER — Encounter: Payer: Self-pay | Admitting: Cardiology

## 2016-05-31 ENCOUNTER — Ambulatory Visit (INDEPENDENT_AMBULATORY_CARE_PROVIDER_SITE_OTHER): Payer: 59 | Admitting: Cardiology

## 2016-05-31 VITALS — BP 122/78 | HR 68 | Ht 72.0 in | Wt 232.4 lb

## 2016-05-31 DIAGNOSIS — I951 Orthostatic hypotension: Secondary | ICD-10-CM

## 2016-05-31 NOTE — Patient Instructions (Signed)
Medication Instructions:  Your physician recommends that you continue on your current medications as directed. Please refer to the Current Medication list given to you today.  Labwork: None ordered.  Testing/Procedures: None ordered.  Follow-Up: Your physician recommends that you schedule a follow-up appointment as needed.   Any Other Special Instructions Will Be Listed Below (If Applicable).     If you need a refill on your cardiac medications before your next appointment, please call your pharmacy.   

## 2016-05-31 NOTE — Progress Notes (Signed)
Electrophysiology Office Note   Date:  05/31/2016   ID:  James Francis, DOB 1971-04-22, MRN [id number]  PCP:  James Antis, MD  Primary Electrophysiologist:  James Lemming, MD    Chief Complaint  Patient presents with  . Hospitalization Follow-up    syncope     History of Present Illness: James Francis is a 45 y.o. male who presents today for electrophysiology evaluation.   History of hyperlipidemia, presented to emergency department after syncopal episode. Patient was biking, which he normally does several times a week. He states he finished biking approximately 12 miles when he started not feeling well. He states "felt weak and was cramping all over." He stopped by fire station to rest and to use the bathroom when he states he walked just a few steps and then woke up on the floor. Patient denies feeling dizzy or lightheaded prior to syncope. He denies any chest pain or palpitations. He denies any prior syncopal episode. per EMS, patient was orthostatic upon their arrival. Patient states he was feeling normal prior to his ride this morning.  He says that prior to his episode of syncope, he got off his bike and sat down on the ground. He got up and walked approximately 100 feet and had an episode of syncope. He says that he was feeling clammy and with muscle cramps at that time. After the episode of syncope felt a little bit better, and certainly felt improved after receiving the fluids by EMS. He has no other history of syncope. No family history of syncope.   Today, he denies symptoms of palpitations, chest pain, shortness of breath, orthopnea, PND, lower extremity edema, claudication, dizziness, presyncope, syncope, bleeding, or neurologic sequela. The patient is tolerating medications without difficulties and is otherwise without complaint today.    Past Medical History  Diagnosis Date  . Hyperlipidemia   . Hearing loss     High frequency bilateral  . Colitis    Indeterminate cause  . Ruptured lumbar disc     L4-L5 as a child  . History of broken nose     Multiple- has deviated septum   Past Surgical History  Procedure Laterality Date  . No past surgeries       Current Outpatient Prescriptions  Medication Sig Dispense Refill  . fenofibrate (TRICOR) 145 MG tablet TAKE 1 TABLET DAILY 90 tablet 3  . metFORMIN (GLUCOPHAGE) 500 MG tablet TAKE 1 TABLET BY MOUTH DAILY WITH BREAKFAST 90 tablet 0  . Multiple Vitamin (MULTIVITAMIN) capsule Take 1 capsule by mouth daily.    . Omega-3 Fatty Acids (FISH OIL) 1000 MG CAPS Take 1,000 mg by mouth daily.     No current facility-administered medications for this visit.    Allergies:   Review of patient's allergies indicates no known allergies.   Social History:  The patient  reports that he has never smoked. He has never used smokeless tobacco. He reports that he drinks alcohol. He reports that he does not use illicit drugs.   Family History:  The patient's family history includes Cancer in his maternal grandmother and mother; Diabetes in his paternal grandfather; Heart disease in his father and paternal grandfather; Kidney Stones in his mother.    ROS:  Please see the history of present illness.   Otherwise, review of systems is positive for none.   All other systems are reviewed and negative.    PHYSICAL EXAM: VS:  BP 122/78 mmHg  Pulse 68  Ht 6' (1.829 m)  Wt 232 lb 6.4 oz (105.416 kg)  BMI 31.51 kg/m2 , BMI Body mass index is 31.51 kg/(m^2). GEN: Well nourished, well developed, in no acute distress HEENT: normal Neck: no JVD, carotid bruits, or masses Cardiac: RRR; no murmurs, rubs, or gallops,no edema  Respiratory:  clear to auscultation bilaterally, normal work of breathing GI: soft, nontender, nondistended, + BS MS: no deformity or atrophy Skin: warm and dry Neuro:  Strength and sensation are intact Psych: euthymic mood, full affect  EKG:  EKG is not ordered today.   Recent  Labs: 05/16/2016: ALT 27; BUN 18; Creatinine, Ser 1.38*; Hemoglobin 13.6; Platelets 200; Potassium 4.3; Sodium 140    Lipid Panel     Component Value Date/Time   CHOL 164 08/13/2015 0813   TRIG 181* 08/13/2015 0813   HDL 43 08/13/2015 0813   CHOLHDL 3.8 08/13/2015 0813   VLDL 36* 08/13/2015 0813   LDLCALC 85 08/13/2015 0813     Wt Readings from Last 3 Encounters:  05/31/16 232 lb 6.4 oz (105.416 kg)  02/14/16 241 lb (109.317 kg)  08/13/15 234 lb (106.142 kg)      Other studies Reviewed: Additional studies/ records that were reviewed today include: TTE 05/22/16  Review of the above records today demonstrates:  - Left ventricle: The cavity size was normal. There was moderate  concentric hypertrophy. Systolic function was normal. The  estimated ejection fraction was in the range of 55% to 60%. Wall  motion was normal; there were no regional wall motion  abnormalities. Features are consistent with a pseudonormal left  ventricular filling pattern, with concomitant abnormal relaxation  and increased filling pressure (grade 2 diastolic dysfunction). - Aortic valve: Trileaflet; normal thickness, mildly calcified  leaflets. There was trivial regurgitation. - Aorta: Aortic root dimension: 40 mm (ED). - Aortic root: The aortic root was mildly dilated. - Pulmonary arteries: PA peak pressure: 33 mm Hg (S).   ASSESSMENT AND PLAN:  1.  Syncope: Syncope appears to be due to likely orthostasis after exercise. I have told him to work on hydration when he exercises. It is unlikely, based on his echocardiogram and his history of not having any palpitations, that this is due to a cardiac cause. I have told him that if he continues to have more episodes of syncope, to call the office back.    Current medicines are reviewed at length with the patient today.   The patient does not have concerns regarding his medicines.  The following changes were made today:  none  Labs/ tests ordered  today include:  No orders of the defined types were placed in this encounter.     Disposition:   FU with James Francis PRN  Signed, James Teichert Jorja LoaMartin Laveah Gloster, MD  05/31/2016 11:33 AM     [facility] [address] [place] [address] [phone] (office) [phone] (fax)

## 2016-06-07 ENCOUNTER — Encounter: Payer: 59 | Admitting: Cardiology

## 2016-08-18 ENCOUNTER — Encounter: Payer: Self-pay | Admitting: Family Medicine

## 2016-08-18 ENCOUNTER — Ambulatory Visit (INDEPENDENT_AMBULATORY_CARE_PROVIDER_SITE_OTHER): Payer: 59 | Admitting: Family Medicine

## 2016-08-18 VITALS — BP 118/62 | HR 62 | Temp 98.0°F | Resp 12 | Ht 72.0 in | Wt 232.0 lb

## 2016-08-18 DIAGNOSIS — M25511 Pain in right shoulder: Secondary | ICD-10-CM

## 2016-08-18 DIAGNOSIS — M25512 Pain in left shoulder: Secondary | ICD-10-CM

## 2016-08-18 DIAGNOSIS — R7303 Prediabetes: Secondary | ICD-10-CM

## 2016-08-18 DIAGNOSIS — Z6831 Body mass index (BMI) 31.0-31.9, adult: Secondary | ICD-10-CM

## 2016-08-18 DIAGNOSIS — E782 Mixed hyperlipidemia: Secondary | ICD-10-CM | POA: Diagnosis not present

## 2016-08-18 DIAGNOSIS — G8929 Other chronic pain: Secondary | ICD-10-CM

## 2016-08-18 DIAGNOSIS — E6609 Other obesity due to excess calories: Secondary | ICD-10-CM

## 2016-08-18 DIAGNOSIS — R1310 Dysphagia, unspecified: Secondary | ICD-10-CM

## 2016-08-18 LAB — CBC WITH DIFFERENTIAL/PLATELET
BASOS ABS: 62 {cells}/uL (ref 0–200)
Basophils Relative: 1 %
Eosinophils Absolute: 124 cells/uL (ref 15–500)
Eosinophils Relative: 2 %
HEMATOCRIT: 43.7 % (ref 38.5–50.0)
HEMOGLOBIN: 14.8 g/dL (ref 13.0–17.0)
LYMPHS ABS: 3348 {cells}/uL (ref 850–3900)
LYMPHS PCT: 54 %
MCH: 28.5 pg (ref 27.0–33.0)
MCHC: 33.9 g/dL (ref 32.0–36.0)
MCV: 84.2 fL (ref 80.0–100.0)
MONO ABS: 434 {cells}/uL (ref 200–950)
MPV: 9.6 fL (ref 7.5–12.5)
Monocytes Relative: 7 %
NEUTROS PCT: 36 %
Neutro Abs: 2232 cells/uL (ref 1500–7800)
Platelets: 218 10*3/uL (ref 140–400)
RBC: 5.19 MIL/uL (ref 4.20–5.80)
RDW: 13.5 % (ref 11.0–15.0)
WBC: 6.2 10*3/uL (ref 3.8–10.8)

## 2016-08-18 LAB — LIPID PANEL
CHOL/HDL RATIO: 4.4 ratio (ref <=5.0)
CHOLESTEROL: 177 mg/dL (ref 125–200)
HDL: 40 mg/dL (ref >=40)
LDL Cholesterol: 93 mg/dL (ref <130)
TRIGLYCERIDES: 222 mg/dL — AB (ref <150)
VLDL: 44 mg/dL — ABNORMAL HIGH (ref <30)

## 2016-08-18 LAB — HEMOGLOBIN A1C
Hgb A1c MFr Bld: 6.1 % — ABNORMAL HIGH (ref <5.7)
MEAN PLASMA GLUCOSE: 128 mg/dL

## 2016-08-18 LAB — COMPREHENSIVE METABOLIC PANEL
ALBUMIN: 4.4 g/dL (ref 3.6–5.1)
ALT: 23 U/L (ref 9–46)
AST: 20 U/L (ref 10–40)
Alkaline Phosphatase: 38 U/L — ABNORMAL LOW (ref 40–115)
BILIRUBIN TOTAL: 0.7 mg/dL (ref 0.2–1.2)
BUN: 21 mg/dL (ref 7–25)
CALCIUM: 9.5 mg/dL (ref 8.6–10.3)
CHLORIDE: 103 mmol/L (ref 98–110)
CO2: 26 mmol/L (ref 20–31)
Creat: 1.42 mg/dL — ABNORMAL HIGH (ref 0.60–1.35)
Glucose, Bld: 108 mg/dL — ABNORMAL HIGH (ref 70–99)
POTASSIUM: 4.8 mmol/L (ref 3.5–5.3)
Sodium: 139 mmol/L (ref 135–146)
Total Protein: 6.6 g/dL (ref 6.1–8.1)

## 2016-08-18 NOTE — Progress Notes (Signed)
   Subjective:    Patient ID: James Francis, male    DOB: 03/02/1971, 45 y.o.   MRN: [id number]  Patient presents for 6 month F/U (is fasting) Patient here to follow-up chronic medical problems. He is currently being monitored for prediabetes he also has hyperlipidemia on treatment obesity. His weight is down 9 pounds since her physical in April  He is currently on metformin and TriCor Note he did have a syncopal episode back in July when he was exercising he was seen by cardiology nothing cardiac was found he's not had any further episodes.   He does continue to have bilateral shoulder pain the left is worse in the right. He does not remember particular injury. He does not have significant pain when he works out he states this seems very random  but he gets aching into his joints. He has not taken anything over-the-counter he tries to stretch through it but he is noticing increasing pain in the joints. He is pointing over his before meals  Review Of Systems:  GEN- denies fatigue, fever, weight loss,weakness, recent illness HEENT- denies eye drainage, change in vision, nasal discharge, CVS- denies chest pain, palpitations RESP- denies SOB, cough, wheeze ABD- denies N/V, change in stools, abd pain GU- denies dysuria, hematuria, dribbling, incontinence MSK- +joint pain,  Denies muscle aches, injury Neuro- denies headache, dizziness, syncope, seizure activity       Objective:    BP 118/62 (BP Location: Left Arm, Patient Position: Sitting, Cuff Size: Normal)   Pulse 62   Temp 98 F (36.7 C) (Oral)   Resp 12   Ht 6' (1.829 m)   Wt 232 lb (105.2 kg)   BMI 31.46 kg/m  GEN- NAD, alert and oriented x3 HEENT- PERRL, EOMI, non injected sclera, pink conjunctiva, MMM, oropharynx clear Neck- Supple, FROM CVS- RRR, no murmur RESP-CTAB MSK- TTP over bilat AC joints, good ROM shoulder, +empty can left side, biceps in tact, strength equal bilat, neg impingment signs EXT- No edema Pulses- Radial  - 2+        Assessment & Plan:      Problem List Items Addressed This Visit    Prediabetes - Primary    Weight down 10lbs, change in diet Recheck A1C on MTF, goal would be able to take off MTF in future Recheck cholesterol- TG on tricor and Omega 3  Flu shot at work      Relevant Orders   CBC with Differential/Platelet   Comprehensive metabolic panel   Hemoglobin A1c   Obesity   Hyperlipidemia   Relevant Orders   CBC with Differential/Platelet   Comprehensive metabolic panel   Lipid panel    Other Visit Diagnoses    Chronic pain of both shoulders       chronic pain, no specific injury, as worsening will obtain xray look at joint spaces, may need ortho   Relevant Orders   DG Shoulder Right   DG Shoulder Left   Dysphagia, unspecified type       Referred back in April, cough with meals some occ feeling foods getting stuck, has tried PPI, send referral again   Relevant Orders   Ambulatory referral to Gastroenterology      Note: This dictation was prepared with Dragon dictation along with smaller phrase technology. Any transcriptional errors that result from this process are unintentional.

## 2016-08-18 NOTE — Patient Instructions (Addendum)
We will call with lab results [facility] [address]    For xray of shoulders  Physical information to be sent to insurance Referral Gastroenterology  F/U 6 months for Physical

## 2016-08-18 NOTE — Assessment & Plan Note (Addendum)
Weight down 10lbs, change in diet Recheck A1C on MTF, goal would be able to take off MTF in future Recheck cholesterol- TG on tricor and Omega 3  Flu shot at work

## 2016-08-27 ENCOUNTER — Other Ambulatory Visit: Payer: Self-pay | Admitting: Family Medicine

## 2016-09-22 ENCOUNTER — Encounter: Payer: Self-pay | Admitting: Gastroenterology

## 2016-11-20 ENCOUNTER — Encounter: Payer: Self-pay | Admitting: Gastroenterology

## 2016-11-20 ENCOUNTER — Ambulatory Visit (INDEPENDENT_AMBULATORY_CARE_PROVIDER_SITE_OTHER): Payer: 59 | Admitting: Gastroenterology

## 2016-11-20 VITALS — BP 110/80 | HR 64 | Ht 72.0 in | Wt 238.2 lb

## 2016-11-20 DIAGNOSIS — R05 Cough: Secondary | ICD-10-CM | POA: Diagnosis not present

## 2016-11-20 DIAGNOSIS — K219 Gastro-esophageal reflux disease without esophagitis: Secondary | ICD-10-CM | POA: Diagnosis not present

## 2016-11-20 DIAGNOSIS — R059 Cough, unspecified: Secondary | ICD-10-CM

## 2016-11-20 MED ORDER — OMEPRAZOLE 40 MG PO CPDR: 40.0000 mg | DELAYED_RELEASE_CAPSULE | Freq: Every day | ORAL | 11 refills | Status: DC

## 2016-11-20 NOTE — Progress Notes (Signed)
History of Present Illness: This is a 46 year old male referred by Salley Scarleturham, Kawanta F, MD for the evaluation of cough. Patient relates a cough following meals that has occurred on a very regular basis for the past 2-3 years. He occasionally has regurgitation. He denies any nocturnal symptoms. He states that he has not tried an over-the-counter acid reducing medication or prescription medication. He relates a past history of IBS but no symptoms in the past several years. States [place] and that it was normal. Denies weight loss, abdominal pain, constipation, diarrhea, change in stool caliber, melena, hematochezia, nausea, vomiting, dysphagia, chest pain.   No Known Allergies Outpatient Medications Prior to Visit  Medication Sig Dispense Refill  . fenofibrate (TRICOR) 145 MG tablet TAKE 1 TABLET DAILY 90 tablet 3  . metFORMIN (GLUCOPHAGE) 500 MG tablet TAKE 1 TABLET BY MOUTH DAILY WITH BREAKFAST 90 tablet 0  . Multiple Vitamin (MULTIVITAMIN) capsule Take 1 capsule by mouth daily.    . Omega-3 Fatty Acids (FISH OIL) 1000 MG CAPS Take 1,000 mg by mouth daily.     No facility-administered medications prior to visit.    Past Medical History:  Diagnosis Date  . Colitis    Indeterminate cause  . Hearing loss    High frequency bilateral  . History of broken nose    Multiple- has deviated septum  . History of colon polyps   . Hyperlipidemia   . Ruptured lumbar disc    L4-L5 as a child   Past Surgical History:  Procedure Laterality Date  . NO PAST SURGERIES     Social History   Social History  . Marital status: Married    Spouse name: N/A  . Number of children: 2  . Years of education: N/A   Occupational History  . Engineer    Social History Main Topics  . Smoking status: Never Smoker  . Smokeless tobacco: Never Used  . Alcohol use 0.0 oz/week     Comment: occasionally   . Drug use: No  . Sexual activity: Not Currently   Other Topics Concern   . None   Social History Narrative  . None   Family History  Problem Relation Age of Onset  . Cancer Mother     unknown type  . Kidney Stones Mother   . Heart disease Father     MI age 46, triple Bypass/ death age 46  . Cancer Maternal Grandmother     Ovarian  . Heart disease Paternal Grandfather     MI  . Diabetes Paternal Grandfather   . Crohn's disease Paternal Aunt   . Colon cancer Neg Hx   . Stomach cancer Neg Hx   . Rectal cancer Neg Hx   . Esophageal cancer Neg Hx   . Liver cancer Neg Hx       Review of Systems: Pertinent positive and negative review of systems were noted in the above HPI section. All other review of systems were otherwise negative.   Physical Exam: General: Well developed, well nourished, no acute distress Head: Normocephalic and atraumatic Eyes:  sclerae anicteric, EOMI Ears: Normal auditory acuity Mouth: No deformity or lesions Neck: Supple, no masses or thyromegaly Lungs: Clear throughout to auscultation Heart: Regular rate and rhythm; no murmurs, rubs or bruits Abdomen: Soft, non tender and non distended. No masses, hepatosplenomegaly or hernias noted. Normal Bowel sounds Musculoskeletal: Symmetrical with no gross deformities  Skin: No lesions on visible extremities Pulses:  Normal pulses noted Extremities: No clubbing, cyanosis, edema or deformities noted Neurological: Alert oriented x 4, grossly nonfocal Cervical Nodes:  No significant cervical adenopathy Inguinal Nodes: No significant inguinal adenopathy Psychological:  Alert and cooperative. Normal mood and affect  Assessment and Recommendations:  1. Postprandial cough, suspected GERD. Begin omeprazole 40 mg daily and follow standard antireflux measures. He was offered the option of proceeding with endoscopy for this initial evaluation however he preferred a therapeutic trial. If his symptoms do not respond to the above regimen recommend endoscopy. REV 6-8 weeks.  2. CRC screening,  average risk. Colonoscopy at age 6. Attempt to obtain prior colonoscopy report.   cc: Salley Scarlet, MD [address] [place], [place] [address]

## 2016-11-20 NOTE — Patient Instructions (Signed)
We have sent the following medications to your pharmacy for you to pick up at your convenience: omeprazole.   Patient advised to avoid spicy, acidic, citrus, chocolate, mints, fruit and fruit juices.  Limit the intake of caffeine, alcohol and Soda.  Don't exercise too soon after eating.  Don't lie down within 3-4 hours of eating.  Elevate the head of your bed.  Normal BMI (Body Mass Index- based on height and weight) is between 19 and 25. Your BMI today is Body mass index is 32.31 kg/m. Marland Kitchen. Please consider follow up  regarding your BMI with your Primary Care Provider.  Thank you for choosing me and [facility].  Venita LickMalcolm T. Pleas KochStark, Jr., MD., Clementeen GrahamFACG

## 2016-11-29 ENCOUNTER — Other Ambulatory Visit: Payer: Self-pay | Admitting: Family Medicine

## 2016-12-01 NOTE — Telephone Encounter (Signed)
Rx refilled per protocol 

## 2017-01-09 ENCOUNTER — Ambulatory Visit: Payer: 59 | Admitting: Gastroenterology

## 2017-02-16 ENCOUNTER — Encounter: Payer: Self-pay | Admitting: Family Medicine

## 2017-02-19 ENCOUNTER — Ambulatory Visit (INDEPENDENT_AMBULATORY_CARE_PROVIDER_SITE_OTHER): Payer: 59 | Admitting: Family Medicine

## 2017-02-19 ENCOUNTER — Encounter: Payer: Self-pay | Admitting: Family Medicine

## 2017-02-19 VITALS — BP 116/62 | HR 80 | Temp 98.1°F | Resp 14 | Ht 72.0 in | Wt 238.0 lb

## 2017-02-19 DIAGNOSIS — E6609 Other obesity due to excess calories: Secondary | ICD-10-CM

## 2017-02-19 DIAGNOSIS — Z6831 Body mass index (BMI) 31.0-31.9, adult: Secondary | ICD-10-CM

## 2017-02-19 DIAGNOSIS — E782 Mixed hyperlipidemia: Secondary | ICD-10-CM | POA: Diagnosis not present

## 2017-02-19 DIAGNOSIS — Z0000 Encounter for general adult medical examination without abnormal findings: Secondary | ICD-10-CM | POA: Diagnosis not present

## 2017-02-19 DIAGNOSIS — R7303 Prediabetes: Secondary | ICD-10-CM

## 2017-02-19 MED ORDER — FENOFIBRATE 145 MG PO TABS: 145.0000 mg | ORAL_TABLET | Freq: Every day | ORAL | 3 refills | Status: DC

## 2017-02-19 MED ORDER — OMEPRAZOLE 40 MG PO CPDR: 40.0000 mg | DELAYED_RELEASE_CAPSULE | Freq: Every day | ORAL | 3 refills | Status: DC

## 2017-02-19 NOTE — Patient Instructions (Signed)
F/U 6 months

## 2017-02-19 NOTE — Progress Notes (Signed)
   Subjective:    Patient ID: James Francis, male    DOB: Apr 30, 1971, 46 y.o.   MRN: [id number]  Patient presents for CPE ( is fasting)   Pt here for CPE , he is currently being treated for pre-diabetes on metformin, also has chronic hyperlipidemia on Tricor and Omega 3 daily   GERD- on omeprazole, evaluated by GI in Jan for dysphagia like symptoms, on therapeutic trial of PPI before endoscopy   Immunizations- UTD  Weight up 6lbs  History reviewed and updated  Reviewed wellness paperwork, His TG were elevated at 273 whichis 50 points higher than before BP was also borderline high 130/82  Non fasting glucose 155  Review Of Systems:  GEN- denies fatigue, fever, weight loss,weakness, recent illness HEENT- denies eye drainage, change in vision, nasal discharge, CVS- denies chest pain, palpitations RESP- denies SOB, cough, wheeze ABD- denies N/V, change in stools, abd pain GU- denies dysuria, hematuria, dribbling, incontinence MSK- denies joint pain, muscle aches, injury Neuro- denies headache, dizziness, syncope, seizure activity       Objective:    BP 116/62   Pulse 80   Temp 98.1 F (36.7 C) (Oral)   Resp 14   Ht 6' (1.829 m)   Wt 238 lb (108 kg)   SpO2 98%   BMI 32.28 kg/m  GEN- NAD, alert and oriented x3 HEENT- PERRL, EOMI, non injected sclera, pink conjunctiva, MMM, oropharynx clear Neck- Supple, no thyromegaly CVS- RRR, no murmur RESP-CTAB ABD-NABS,soft,NT,ND EXT- No edema Pulses- Radial, DP- 2+        Assessment & Plan:      Problem List Items Addressed This Visit    Prediabetes   Relevant Orders   Hemoglobin A1c   Obesity   Hyperlipidemia   Relevant Medications   fenofibrate (TRICOR) 145 MG tablet    Other Visit Diagnoses    Routine general medical examination at a health care facility    -  Primary   CPE done, discussed diet and exercise which helps his TG. BP typically controlled, will send letter if needed to insurance showing this. Recheck  A1C may be able discontinue the metformin, last A1C was 6.1% Nasal  drainage- recent URI, can use OTC anti-histamine or nasal steroid   Relevant Orders   CBC with Differential/Platelet   Comprehensive metabolic panel      Note: This dictation was prepared with Dragon dictation along with smaller phrase technology. Any transcriptional errors that result from this process are unintentional.

## 2017-02-20 LAB — CBC WITH DIFFERENTIAL/PLATELET
BASOS ABS: 61 {cells}/uL (ref 0–200)
Basophils Relative: 1 %
EOS ABS: 183 {cells}/uL (ref 15–500)
EOS PCT: 3 %
HCT: 43.2 % (ref 38.5–50.0)
Hemoglobin: 14.4 g/dL (ref 13.0–17.0)
LYMPHS ABS: 3050 {cells}/uL (ref 850–3900)
Lymphocytes Relative: 50 %
MCH: 27.7 pg (ref 27.0–33.0)
MCHC: 33.3 g/dL (ref 32.0–36.0)
MCV: 83.2 fL (ref 80.0–100.0)
MONO ABS: 427 {cells}/uL (ref 200–950)
MPV: 9.7 fL (ref 7.5–12.5)
Monocytes Relative: 7 %
NEUTROS ABS: 2379 {cells}/uL (ref 1500–7800)
Neutrophils Relative %: 39 %
PLATELETS: 247 10*3/uL (ref 140–400)
RBC: 5.19 MIL/uL (ref 4.20–5.80)
RDW: 13.7 % (ref 11.0–15.0)
WBC: 6.1 10*3/uL (ref 3.8–10.8)

## 2017-02-20 LAB — COMPREHENSIVE METABOLIC PANEL
ALK PHOS: 41 U/L (ref 40–115)
ALT: 48 U/L — AB (ref 9–46)
AST: 30 U/L (ref 10–40)
Albumin: 4.2 g/dL (ref 3.6–5.1)
BUN: 15 mg/dL (ref 7–25)
CO2: 27 mmol/L (ref 20–31)
CREATININE: 1.2 mg/dL (ref 0.60–1.35)
Calcium: 9 mg/dL (ref 8.6–10.3)
Chloride: 104 mmol/L (ref 98–110)
Glucose, Bld: 119 mg/dL — ABNORMAL HIGH (ref 70–99)
POTASSIUM: 4.3 mmol/L (ref 3.5–5.3)
SODIUM: 139 mmol/L (ref 135–146)
TOTAL PROTEIN: 6.5 g/dL (ref 6.1–8.1)
Total Bilirubin: 0.6 mg/dL (ref 0.2–1.2)

## 2017-02-20 LAB — HEMOGLOBIN A1C
HEMOGLOBIN A1C: 6.8 % — AB (ref <5.7)
Mean Plasma Glucose: 148 mg/dL

## 2017-02-23 ENCOUNTER — Other Ambulatory Visit: Payer: Self-pay | Admitting: *Deleted

## 2017-02-23 MED ORDER — METFORMIN HCL 500 MG PO TABS: 500.0000 mg | ORAL_TABLET | Freq: Two times a day (BID) | ORAL | 1 refills | Status: DC

## 2017-03-24 ENCOUNTER — Other Ambulatory Visit: Payer: Self-pay | Admitting: Family Medicine

## 2017-05-10 ENCOUNTER — Other Ambulatory Visit: Payer: Self-pay | Admitting: Family Medicine

## 2017-08-21 ENCOUNTER — Encounter: Payer: Self-pay | Admitting: Family Medicine

## 2017-08-21 ENCOUNTER — Ambulatory Visit (INDEPENDENT_AMBULATORY_CARE_PROVIDER_SITE_OTHER): Payer: 59 | Admitting: Family Medicine

## 2017-08-21 VITALS — BP 118/80 | HR 56 | Temp 98.4°F | Resp 18 | Wt 236.2 lb

## 2017-08-21 DIAGNOSIS — E119 Type 2 diabetes mellitus without complications: Secondary | ICD-10-CM | POA: Diagnosis not present

## 2017-08-21 DIAGNOSIS — E782 Mixed hyperlipidemia: Secondary | ICD-10-CM

## 2017-08-21 DIAGNOSIS — Z23 Encounter for immunization: Secondary | ICD-10-CM

## 2017-08-21 DIAGNOSIS — Z6832 Body mass index (BMI) 32.0-32.9, adult: Secondary | ICD-10-CM | POA: Diagnosis not present

## 2017-08-21 DIAGNOSIS — E6609 Other obesity due to excess calories: Secondary | ICD-10-CM | POA: Diagnosis not present

## 2017-08-21 NOTE — Assessment & Plan Note (Signed)
Goals to get his A1c Below 6.5% at this point and not to start any statin drugs continue TriCor and we'll hold off an ACE inhibitor unless I see that he does have signs of some renal insufficiency. I think with his dietary changes and weight loss again correct the diabetes without further progression. Fasting labs to be done today. No change to his medications until they are reviewed. Pneumonia vaccine was given

## 2017-08-21 NOTE — Progress Notes (Signed)
   Subjective:    Patient ID: James Francis, male    DOB: 11-15-1970, 46 y.o.   MRN: [id number]  Patient presents for 6 mth check up (is fasting)  Patient here for follow-up.  diabetes mellitus currently on metformin 500 mg twice a day this last A1c was 6.8% fasting blood sugars- No hypoglycemia and no polyuria polydipsia   Hyperlipidemia with hypertriglyceridemia he has been on TriCor as well as omega-3 Fish oil due for recheck today  Review Of Systems:  GEN- denies fatigue, fever, weight loss,weakness, recent illness HEENT- denies eye drainage, change in vision, nasal discharge, CVS- denies chest pain, palpitations RESP- denies SOB, cough, wheeze ABD- denies N/V, change in stools, abd pain GU- denies dysuria, hematuria, dribbling, incontinence MSK- denies joint pain, muscle aches, injury Neuro- denies headache, dizziness, syncope, seizure activity       Objective:    BP 118/80 (BP Location: Right Arm, Patient Position: Sitting, Cuff Size: Large)   Pulse (!) 56   Temp 98.4 F (36.9 C) (Oral)   Resp 18   Wt 236 lb 3.2 oz (107.1 kg)   BMI 32.03 kg/m  GEN- NAD, alert and oriented x3 HEENT- PERRL, EOMI, non injected sclera, pink conjunctiva, MMM, oropharynx clear Neck- Supple, no thyromegaly CVS- RRR, no murmur RESP-CTAB ABD-NABS,soft,NT,ND EXT- No edema Pulses- Radial, DP- 2+        Assessment & Plan:      Problem List Items Addressed This Visit      Unprioritized   Obesity   Hyperlipidemia   Relevant Orders   Lipid panel   Diabetes mellitus without complication (HCC) - Primary    Goals to get his A1c Below 6.5% at this point and not to start any statin drugs continue TriCor and we'll hold off an ACE inhibitor unless I see that he does have signs of some renal insufficiency. I think with his dietary changes and weight loss again correct the diabetes without further progression. Fasting labs to be done today. No change to his medications until they are reviewed.  Pneumonia vaccine was given      Relevant Orders   Comprehensive metabolic panel   Hemoglobin A1c   HM DIABETES FOOT EXAM (Completed)   Microalbumin / creatinine urine ratio      Note: This dictation was prepared with Dragon dictation along with smaller phrase technology. Any transcriptional errors that result from this process are unintentional.

## 2017-08-21 NOTE — Addendum Note (Signed)
Addended by: Legrand Rams B on: 08/21/2017 10:48 AM   Modules accepted: Orders

## 2017-08-21 NOTE — Patient Instructions (Addendum)
Check CBG fasting  F/U 6 months for Physical

## 2017-08-22 LAB — MICROALBUMIN / CREATININE URINE RATIO
Creatinine, Urine: 132 mg/dL (ref 20–320)
Microalb Creat Ratio: 2 mcg/mg creat (ref <30)
Microalb, Ur: 0.2 mg/dL

## 2017-08-22 LAB — LIPID PANEL
Cholesterol: 197 mg/dL (ref <200)
HDL: 37 mg/dL — ABNORMAL LOW (ref >40)
LDL Cholesterol (Calc): 120 mg/dL (calc) — ABNORMAL HIGH
NON-HDL CHOLESTEROL (CALC): 160 mg/dL — AB (ref <130)
Total CHOL/HDL Ratio: 5.3 (calc) — ABNORMAL HIGH (ref <5.0)
Triglycerides: 258 mg/dL — ABNORMAL HIGH (ref <150)

## 2017-08-22 LAB — COMPREHENSIVE METABOLIC PANEL
AG RATIO: 2 (calc) (ref 1.0–2.5)
ALT: 39 U/L (ref 9–46)
AST: 27 U/L (ref 10–40)
Albumin: 4.3 g/dL (ref 3.6–5.1)
Alkaline phosphatase (APISO): 46 U/L (ref 40–115)
BUN: 16 mg/dL (ref 7–25)
CO2: 27 mmol/L (ref 20–32)
Calcium: 9.1 mg/dL (ref 8.6–10.3)
Chloride: 105 mmol/L (ref 98–110)
Creat: 1.25 mg/dL (ref 0.60–1.35)
GLOBULIN: 2.2 g/dL (ref 1.9–3.7)
Glucose, Bld: 114 mg/dL — ABNORMAL HIGH (ref 65–99)
Potassium: 4.5 mmol/L (ref 3.5–5.3)
SODIUM: 140 mmol/L (ref 135–146)
TOTAL PROTEIN: 6.5 g/dL (ref 6.1–8.1)
Total Bilirubin: 0.4 mg/dL (ref 0.2–1.2)

## 2017-08-22 LAB — HEMOGLOBIN A1C
EAG (MMOL/L): 7.7 (calc)
HEMOGLOBIN A1C: 6.5 %{Hb} — AB (ref <5.7)
Mean Plasma Glucose: 140 (calc)

## 2017-08-29 ENCOUNTER — Encounter: Payer: Self-pay | Admitting: Family Medicine

## 2017-12-06 ENCOUNTER — Encounter: Payer: Self-pay | Admitting: Family Medicine

## 2017-12-09 ENCOUNTER — Other Ambulatory Visit: Payer: Self-pay | Admitting: Family Medicine

## 2017-12-10 ENCOUNTER — Other Ambulatory Visit: Payer: Self-pay | Admitting: Family Medicine

## 2018-01-10 ENCOUNTER — Other Ambulatory Visit: Payer: Self-pay | Admitting: Family Medicine

## 2018-02-19 ENCOUNTER — Other Ambulatory Visit: Payer: Self-pay

## 2018-02-19 ENCOUNTER — Encounter: Payer: Self-pay | Admitting: Family Medicine

## 2018-02-19 ENCOUNTER — Ambulatory Visit: Payer: 59 | Admitting: Family Medicine

## 2018-02-19 VITALS — BP 124/78 | HR 54 | Temp 98.2°F | Resp 12 | Ht 72.0 in | Wt 241.0 lb

## 2018-02-19 DIAGNOSIS — E119 Type 2 diabetes mellitus without complications: Secondary | ICD-10-CM | POA: Diagnosis not present

## 2018-02-19 DIAGNOSIS — Z6832 Body mass index (BMI) 32.0-32.9, adult: Secondary | ICD-10-CM | POA: Diagnosis not present

## 2018-02-19 DIAGNOSIS — E782 Mixed hyperlipidemia: Secondary | ICD-10-CM

## 2018-02-19 DIAGNOSIS — Z0000 Encounter for general adult medical examination without abnormal findings: Secondary | ICD-10-CM | POA: Diagnosis not present

## 2018-02-19 DIAGNOSIS — E6609 Other obesity due to excess calories: Secondary | ICD-10-CM | POA: Diagnosis not present

## 2018-02-19 NOTE — Patient Instructions (Addendum)
Release of records- My eye doctor- [facility]  Mediterranean diet  F/U 6 months    Mediterranean Diet A Mediterranean diet refers to food and lifestyle choices that are based on the traditions of countries located on the [employer]. This way of eating has been shown to help prevent certain conditions and improve outcomes for people who have chronic diseases, like kidney disease and heart disease. What are tips for following this plan? Lifestyle  Cook and eat meals together with your family, when possible.  Drink enough fluid to keep your urine clear or pale yellow.  Be physically active every day. This includes: ? Aerobic exercise like running or swimming. ? Leisure activities like gardening, walking, or housework.  Get 7-8 hours of sleep each night.  If recommended by your health care provider, drink red wine in moderation. This means 1 glass a day for nonpregnant women and 2 glasses a day for men. A glass of wine equals 5 oz (150 mL). Reading food labels  Check the serving size of packaged foods. For foods such as rice and pasta, the serving size refers to the amount of cooked product, not dry.  Check the total fat in packaged foods. Avoid foods that have saturated fat or trans fats.  Check the ingredients list for added sugars, such as corn syrup. Shopping  At the grocery store, buy most of your food from the areas near the walls of the store. This includes: ? Fresh fruits and vegetables (produce). ? Grains, beans, nuts, and seeds. Some of these may be available in unpackaged forms or large amounts (in bulk). ? Fresh seafood. ? Poultry and eggs. ? Low-fat dairy products.  Buy whole ingredients instead of prepackaged foods.  Buy fresh fruits and vegetables in-season from local farmers markets.  Buy frozen fruits and vegetables in resealable bags.  If you do not have access to quality fresh seafood, buy precooked frozen shrimp or canned fish, such as tuna,  salmon, or sardines.  Buy small amounts of raw or cooked vegetables, salads, or olives from the deli or salad bar at your store.  Stock your pantry so you always have certain foods on hand, such as olive oil, canned tuna, canned tomatoes, rice, pasta, and beans. Cooking  Cook foods with extra-virgin olive oil instead of using butter or other vegetable oils.  Have meat as a side dish, and have vegetables or grains as your main dish. This means having meat in small portions or adding small amounts of meat to foods like pasta or stew.  Use beans or vegetables instead of meat in common dishes like chili or lasagna.  Experiment with different cooking methods. Try roasting or broiling vegetables instead of steaming or sauteing them.  Add frozen vegetables to soups, stews, pasta, or rice.  Add nuts or seeds for added healthy fat at each meal. You can add these to yogurt, salads, or vegetable dishes.  Marinate fish or vegetables using olive oil, lemon juice, garlic, and fresh herbs. Meal planning  Plan to eat 1 vegetarian meal one day each week. Try to work up to 2 vegetarian meals, if possible.  Eat seafood 2 or more times a week.  Have healthy snacks readily available, such as: ? Vegetable sticks with hummus. ? [place] yogurt. ? Fruit and nut trail mix.  Eat balanced meals throughout the week. This includes: ? Fruit: 2-3 servings a day ? Vegetables: 4-5 servings a day ? Low-fat dairy: 2 servings a day ? Fish, poultry, or  lean meat: 1 serving a day ? Beans and legumes: 2 or more servings a week ? Nuts and seeds: 1-2 servings a day ? Whole grains: 6-8 servings a day ? Extra-virgin olive oil: 3-4 servings a day  Limit red meat and sweets to only a few servings a month What are my food choices?  Mediterranean diet ? Recommended ? Grains: Whole-grain pasta. Brown rice. Bulgar wheat. Polenta. Couscous. Whole-wheat bread. Orpah Cobbatmeal. Quinoa. ? Vegetables: Artichokes. Beets. Broccoli.  Cabbage. Carrots. Eggplant. Green beans. Chard. Kale. Spinach. Onions. Leeks. Peas. Squash. Tomatoes. Peppers. Radishes. ? Fruits: Apples. Apricots. Avocado. Berries. Bananas. Cherries. Dates. Figs. Grapes. Lemons. Melon. Oranges. Peaches. Plums. Pomegranate. ? Meats and other protein foods: Beans. Almonds. Sunflower seeds. Pine nuts. Peanuts. Cod. Salmon. Scallops. Shrimp. Tuna. Tilapia. Clams. Oysters. Eggs. ? Dairy: Low-fat milk. Cheese. Greek yogurt. ? Beverages: Water. Red wine. Herbal tea. ? Fats and oils: Extra virgin olive oil. Avocado oil. Grape seed oil. ? Sweets and desserts: [place] yogurt with honey. Baked apples. Poached pears. Trail mix. ? Seasoning and other foods: Basil. Cilantro. Coriander. Cumin. Mint. Parsley. Sage. Rosemary. Tarragon. Garlic. Oregano. Thyme. Pepper. Balsalmic vinegar. Tahini. Hummus. Tomato sauce. Olives. Mushrooms. ? Limit these ? Grains: Prepackaged pasta or rice dishes. Prepackaged cereal with added sugar. ? Vegetables: Deep fried potatoes (french fries). ? Fruits: Fruit canned in syrup. ? Meats and other protein foods: Beef. Pork. Lamb. Poultry with skin. Hot dogs. Tomasa BlaseBacon. ? Dairy: Ice cream. Sour cream. Whole milk. ? Beverages: Juice. Sugar-sweetened soft drinks. Beer. Liquor and spirits. ? Fats and oils: Butter. Canola oil. Vegetable oil. Beef fat (tallow). Lard. ? Sweets and desserts: Cookies. Cakes. Pies. Candy. ? Seasoning and other foods: Mayonnaise. Premade sauces and marinades. ? The items listed may not be a complete list. Talk with your dietitian about what dietary choices are right for you. Summary  The Mediterranean diet includes both food and lifestyle choices.  Eat a variety of fresh fruits and vegetables, beans, nuts, seeds, and whole grains.  Limit the amount of red meat and sweets that you eat.  Talk with your health care provider about whether it is safe for you to drink red wine in moderation. This means 1 glass a day for  nonpregnant women and 2 glasses a day for men. A glass of wine equals 5 oz (150 mL). This information is not intended to replace advice given to you by your health care provider. Make sure you discuss any questions you have with your health care provider. Document Released: 06/22/2016 Document Revised: 07/25/2016 Document Reviewed: 06/22/2016 Elsevier Interactive Patient Education  [employer]2018 Elsevier Inc.

## 2018-02-19 NOTE — Progress Notes (Signed)
   Subjective:    Patient ID: James Francis, male    DOB: 09/16/1971, 47 y.o.   MRN: [id number]  Patient presents for Annual Exam (is fasting) Patient here for complete physical exam.  Medications reviewed Immunizations up-to-date Due for eye exam  Diabetes mellitus last A1c 6.5% he is currently on metformin, weight is up 5 pounds since his last visit in October  Hyperlipidemia he is taking TriCor as prescribed  Acid reflux taking Prilosec    Home weight is  234lbs   Review Of Systems:  GEN- denies fatigue, fever, weight loss,weakness, recent illness HEENT- denies eye drainage, change in vision, nasal discharge, CVS- denies chest pain, palpitations RESP- denies SOB, cough, wheeze ABD- denies N/V, change in stools, abd pain GU- denies dysuria, hematuria, dribbling, incontinence MSK- denies joint pain, muscle aches, injury Neuro- denies headache, dizziness, syncope, seizure activity       Objective:    BP 124/78   Pulse (!) 54   Temp 98.2 F (36.8 C) (Oral)   Resp 12   Ht 6' (1.829 m)   Wt 241 lb (109.3 kg)   SpO2 98%   BMI 32.69 kg/m  GEN- NAD, alert and oriented x3 HEENT- PERRL, EOMI, non injected sclera, pink conjunctiva, MMM, oropharynx clear Neck- Supple, no thyromegaly CVS- RRR, no murmur RESP-CTAB ABD-NABS,soft,NT,ND EXT- No edema Pulses- Radial, DP- 2+        Assessment & Plan:      Problem List Items Addressed This Visit      Unprioritized   Obesity   Hyperlipidemia    He was on both statin and tricor in the past Consider trying him on higher dose statin such as lipitor 40mg  instead       Relevant Orders   Lipid panel   Diabetes mellitus without complication (HCC)    diabetes has been well controlled Discussed diet, lower carbs,  Given handout on mediterranean diet and the plate method Discussed adding ACEI based on labs for renal protection, plan for lisinopril 2.5mg       Relevant Orders   CBC with Differential/Platelet   Comprehensive metabolic panel   Hemoglobin A1c    Other Visit Diagnoses    Routine general medical examination at a health care facility    -  Primary   obtain eye doctor records, Fasting labs, immunziations UTD      Note: This dictation was prepared with Dragon dictation along with smaller phrase technology. Any transcriptional errors that result from this process are unintentional.

## 2018-02-19 NOTE — Assessment & Plan Note (Signed)
He was on both statin and tricor in the past Consider trying him on higher dose statin such as lipitor 40mg  instead

## 2018-02-19 NOTE — Assessment & Plan Note (Signed)
diabetes has been well controlled Discussed diet, lower carbs,  Given handout on mediterranean diet and the plate method Discussed adding ACEI based on labs for renal protection, plan for lisinopril 2.5mg 

## 2018-02-20 LAB — HEMOGLOBIN A1C
Hgb A1c MFr Bld: 7.8 % of total Hgb — ABNORMAL HIGH (ref <5.7)
Mean Plasma Glucose: 177 (calc)
eAG (mmol/L): 9.8 (calc)

## 2018-02-20 LAB — CBC WITH DIFFERENTIAL/PLATELET
BASOS ABS: 38 {cells}/uL (ref 0–200)
Basophils Relative: 0.9 %
EOS PCT: 1.4 %
Eosinophils Absolute: 59 cells/uL (ref 15–500)
HEMATOCRIT: 41.8 % (ref 38.5–50.0)
Hemoglobin: 13.6 g/dL (ref 13.2–17.1)
LYMPHS ABS: 2360 {cells}/uL (ref 850–3900)
MCH: 25.9 pg — ABNORMAL LOW (ref 27.0–33.0)
MCHC: 32.5 g/dL (ref 32.0–36.0)
MCV: 79.5 fL — ABNORMAL LOW (ref 80.0–100.0)
MONOS PCT: 11.8 %
MPV: 10.9 fL (ref 7.5–12.5)
NEUTROS PCT: 29.7 %
Neutro Abs: 1247 cells/uL — ABNORMAL LOW (ref 1500–7800)
Platelets: 224 10*3/uL (ref 140–400)
RBC: 5.26 10*6/uL (ref 4.20–5.80)
RDW: 14.1 % (ref 11.0–15.0)
TOTAL LYMPHOCYTE: 56.2 %
WBC mixed population: 496 cells/uL (ref 200–950)
WBC: 4.2 10*3/uL (ref 3.8–10.8)

## 2018-02-20 LAB — COMPREHENSIVE METABOLIC PANEL
AG Ratio: 2.4 (calc) (ref 1.0–2.5)
ALT: 64 U/L — AB (ref 9–46)
AST: 41 U/L — ABNORMAL HIGH (ref 10–40)
Albumin: 4.6 g/dL (ref 3.6–5.1)
Alkaline phosphatase (APISO): 43 U/L (ref 40–115)
BILIRUBIN TOTAL: 0.6 mg/dL (ref 0.2–1.2)
BUN/Creatinine Ratio: 13 (calc) (ref 6–22)
BUN: 17 mg/dL (ref 7–25)
CALCIUM: 9 mg/dL (ref 8.6–10.3)
CO2: 27 mmol/L (ref 20–32)
Chloride: 104 mmol/L (ref 98–110)
Creat: 1.36 mg/dL — ABNORMAL HIGH (ref 0.60–1.35)
Globulin: 1.9 g/dL (calc) (ref 1.9–3.7)
Glucose, Bld: 146 mg/dL — ABNORMAL HIGH (ref 65–99)
Potassium: 4.6 mmol/L (ref 3.5–5.3)
SODIUM: 139 mmol/L (ref 135–146)
TOTAL PROTEIN: 6.5 g/dL (ref 6.1–8.1)

## 2018-02-20 LAB — LIPID PANEL
CHOLESTEROL: 173 mg/dL (ref <200)
HDL: 36 mg/dL — ABNORMAL LOW (ref >40)
LDL CHOLESTEROL (CALC): 108 mg/dL — AB
Non-HDL Cholesterol (Calc): 137 mg/dL (calc) — ABNORMAL HIGH (ref <130)
TRIGLYCERIDES: 170 mg/dL — AB (ref <150)
Total CHOL/HDL Ratio: 4.8 (calc) (ref <5.0)

## 2018-03-04 ENCOUNTER — Other Ambulatory Visit: Payer: Self-pay | Admitting: *Deleted

## 2018-03-04 MED ORDER — METFORMIN HCL 500 MG PO TABS: 1000.0000 mg | ORAL_TABLET | Freq: Two times a day (BID) | ORAL | 3 refills | Status: DC

## 2018-03-04 MED ORDER — ATORVASTATIN CALCIUM 40 MG PO TABS: 40.0000 mg | ORAL_TABLET | Freq: Every day | ORAL | 3 refills | Status: DC

## 2018-03-04 MED ORDER — LISINOPRIL 5 MG PO TABS: 5.0000 mg | ORAL_TABLET | Freq: Every day | ORAL | 3 refills | Status: DC

## 2018-03-12 MED ORDER — LISINOPRIL 5 MG PO TABS: 5.0000 mg | ORAL_TABLET | Freq: Every day | ORAL | 3 refills | Status: DC

## 2018-03-12 MED ORDER — METFORMIN HCL 500 MG PO TABS: 1000.0000 mg | ORAL_TABLET | Freq: Two times a day (BID) | ORAL | 3 refills | Status: DC

## 2018-03-12 MED ORDER — ATORVASTATIN CALCIUM 40 MG PO TABS: 40.0000 mg | ORAL_TABLET | Freq: Every day | ORAL | 3 refills | Status: DC

## 2018-03-12 NOTE — Addendum Note (Signed)
Addended by: Phillips Odor on: 03/12/2018 02:35 PM   Modules accepted: Orders

## 2018-03-17 ENCOUNTER — Other Ambulatory Visit: Payer: Self-pay | Admitting: Family Medicine

## 2018-04-01 ENCOUNTER — Other Ambulatory Visit: Payer: Self-pay

## 2018-06-15 DIAGNOSIS — T679XXA Effect of heat and light, unspecified, initial encounter: Secondary | ICD-10-CM | POA: Diagnosis not present

## 2018-06-24 ENCOUNTER — Other Ambulatory Visit: Payer: 59

## 2018-06-24 DIAGNOSIS — E119 Type 2 diabetes mellitus without complications: Secondary | ICD-10-CM

## 2018-06-24 DIAGNOSIS — E782 Mixed hyperlipidemia: Secondary | ICD-10-CM | POA: Diagnosis not present

## 2018-06-25 LAB — LIPID PANEL
CHOLESTEROL: 105 mg/dL (ref <200)
HDL: 40 mg/dL — ABNORMAL LOW (ref >40)
LDL Cholesterol (Calc): 46 mg/dL (calc)
Non-HDL Cholesterol (Calc): 65 mg/dL (calc) (ref <130)
Total CHOL/HDL Ratio: 2.6 (calc) (ref <5.0)
Triglycerides: 107 mg/dL (ref <150)

## 2018-06-25 LAB — HEMOGLOBIN A1C
EAG (MMOL/L): 7.9 (calc)
HEMOGLOBIN A1C: 6.6 %{Hb} — AB (ref <5.7)
MEAN PLASMA GLUCOSE: 143 (calc)

## 2018-07-08 ENCOUNTER — Other Ambulatory Visit: Payer: Self-pay

## 2018-07-08 ENCOUNTER — Ambulatory Visit: Payer: 59 | Admitting: Family Medicine

## 2018-07-08 ENCOUNTER — Encounter: Payer: Self-pay | Admitting: Family Medicine

## 2018-07-08 VITALS — BP 120/62 | HR 62 | Temp 98.4°F | Resp 12 | Ht 72.0 in | Wt 223.0 lb

## 2018-07-08 DIAGNOSIS — E6609 Other obesity due to excess calories: Secondary | ICD-10-CM | POA: Diagnosis not present

## 2018-07-08 DIAGNOSIS — Z6830 Body mass index (BMI) 30.0-30.9, adult: Secondary | ICD-10-CM

## 2018-07-08 DIAGNOSIS — E119 Type 2 diabetes mellitus without complications: Secondary | ICD-10-CM | POA: Diagnosis not present

## 2018-07-08 DIAGNOSIS — E782 Mixed hyperlipidemia: Secondary | ICD-10-CM | POA: Diagnosis not present

## 2018-07-08 DIAGNOSIS — E66811 Obesity, class 1: Secondary | ICD-10-CM

## 2018-07-08 LAB — COMPREHENSIVE METABOLIC PANEL
AG Ratio: 2 (calc) (ref 1.0–2.5)
ALBUMIN MSPROF: 4.5 g/dL (ref 3.6–5.1)
ALT: 21 U/L (ref 9–46)
AST: 19 U/L (ref 10–40)
Alkaline phosphatase (APISO): 40 U/L (ref 40–115)
BILIRUBIN TOTAL: 0.7 mg/dL (ref 0.2–1.2)
BUN: 18 mg/dL (ref 7–25)
CO2: 25 mmol/L (ref 20–32)
CREATININE: 1.28 mg/dL (ref 0.60–1.35)
Calcium: 9.6 mg/dL (ref 8.6–10.3)
Chloride: 105 mmol/L (ref 98–110)
Globulin: 2.2 g/dL (calc) (ref 1.9–3.7)
Glucose, Bld: 98 mg/dL (ref 65–99)
POTASSIUM: 4.3 mmol/L (ref 3.5–5.3)
Sodium: 140 mmol/L (ref 135–146)
Total Protein: 6.7 g/dL (ref 6.1–8.1)

## 2018-07-08 MED ORDER — ATORVASTATIN CALCIUM 40 MG PO TABS: 20.0000 mg | ORAL_TABLET | Freq: Every day | ORAL | 3 refills | Status: DC

## 2018-07-08 MED ORDER — METFORMIN HCL 500 MG PO TABS: 500.0000 mg | ORAL_TABLET | Freq: Two times a day (BID) | ORAL | 3 refills | Status: DC

## 2018-07-08 NOTE — Progress Notes (Signed)
   Subjective:    Patient ID: James Francis, male    DOB: 03/31/1971, 47 y.o.   MRN: [id number]  Patient presents for Follow-up Patient here to follow-up chronic medical problems.  Her last visit his A1c was elevated to 7.8% he also was not following a good diet.  His metformin was increased to thousand milligrams twice a day.  He was also started on lisinopril for renal protection.  He had elevated LFTs in the setting of his hyperlipidemia with elevated triglycerides.  TriCor was discontinued and he was started on Lipitor 40 mg at bedtime.  He is been following weight watchers with his wife.  He is down 20 pounds.  He is exercising regularly.  Not been checking his blood sugars but he did have his labs done 2 weeks ago his A1c is now down to 6.6% and his cholesterol panel is completely normal.  He would like to decrease the metformin he is noted that he has more GI upset since he has lost weight.  [facility] exam no retinopathy found.  No new concerns   Review Of Systems:  GEN- denies fatigue, fever, weight loss,weakness, recent illness HEENT- denies eye drainage, change in vision, nasal discharge, CVS- denies chest pain, palpitations RESP- denies SOB, cough, wheeze ABD- denies N/V, change in stools, abd pain GU- denies dysuria, hematuria, dribbling, incontinence MSK- denies joint pain, muscle aches, injury Neuro- denies headache, dizziness, syncope, seizure activity       Objective:    BP 120/62   Pulse 62   Temp 98.4 F (36.9 C) (Oral)   Resp 12   Ht 6' (1.829 m)   Wt 223 lb (101.2 kg)   SpO2 98%   BMI 30.24 kg/m  GEN- NAD, alert and oriented x3 HEENT- PERRL, EOMI, non injected sclera, pink conjunctiva, MMM, oropharynx clear Neck- Supple, no thyromegaly CVS- RRR, no murmur RESP-CTAB ABD-NABS,soft,NT,ND EXT- No edema Pulses- Radial, DP- 2+        Assessment & Plan:      Problem List Items Addressed This Visit      Unprioritized    Diabetes mellitus without complication (HCC) - Primary    We will decrease his metformin to 500 mg twice a day again.  Repeat his labs in a few months he is able to maintain his weight loss and exercise will likely be able to come off of the metformin.  Continue the ACE inhibitor for renal protection      Relevant Medications   metFORMIN (GLUCOPHAGE) 500 MG tablet   atorvastatin (LIPITOR) 40 MG tablet   Other Relevant Orders   Comprehensive metabolic panel   Hyperlipidemia    Decrease in his statin drug down to 20 mg once a day.  We will recheck his liver function test as well.      Relevant Medications   atorvastatin (LIPITOR) 40 MG tablet   Obesity    Continue to work on dietary changes and weight loss.      Relevant Medications   metFORMIN (GLUCOPHAGE) 500 MG tablet      Note: This dictation was prepared with Dragon dictation along with smaller phrase technology. Any transcriptional errors that result from this process are unintentional.

## 2018-07-08 NOTE — Patient Instructions (Signed)
F/U 4 months  

## 2018-07-08 NOTE — Assessment & Plan Note (Signed)
Decrease in his statin drug down to 20 mg once a day.  We will recheck his liver function test as well.

## 2018-07-08 NOTE — Assessment & Plan Note (Signed)
Continue to work on dietary changes and weight loss 

## 2018-07-08 NOTE — Assessment & Plan Note (Signed)
We will decrease his metformin to 500 mg twice a day again.  Repeat his labs in a few months he is able to maintain his weight loss and exercise will likely be able to come off of the metformin.  Continue the ACE inhibitor for renal protection

## 2018-11-18 ENCOUNTER — Ambulatory Visit: Payer: 59 | Admitting: Family Medicine

## 2018-11-18 ENCOUNTER — Other Ambulatory Visit: Payer: Self-pay

## 2018-11-18 ENCOUNTER — Other Ambulatory Visit: Payer: Self-pay | Admitting: *Deleted

## 2018-11-18 ENCOUNTER — Encounter: Payer: Self-pay | Admitting: Family Medicine

## 2018-11-18 VITALS — BP 126/72 | HR 60 | Temp 97.9°F | Resp 12 | Ht 72.0 in | Wt 233.0 lb

## 2018-11-18 DIAGNOSIS — E782 Mixed hyperlipidemia: Secondary | ICD-10-CM

## 2018-11-18 DIAGNOSIS — Z6831 Body mass index (BMI) 31.0-31.9, adult: Secondary | ICD-10-CM | POA: Diagnosis not present

## 2018-11-18 DIAGNOSIS — E119 Type 2 diabetes mellitus without complications: Secondary | ICD-10-CM

## 2018-11-18 DIAGNOSIS — E6609 Other obesity due to excess calories: Secondary | ICD-10-CM

## 2018-11-18 MED ORDER — BLOOD GLUCOSE TEST VI STRP: ORAL_STRIP | 1 refills | Status: DC

## 2018-11-18 NOTE — Patient Instructions (Addendum)
F/U End of April for Physical Schedule eye exam  Stop the fenofibrate

## 2018-11-18 NOTE — Assessment & Plan Note (Signed)
D/C tricor starting today  Continue lipitor 20mg  Check liver function again He continues to work on dietary changes and exercise

## 2018-11-18 NOTE — Progress Notes (Signed)
   Subjective:    Patient ID: James Francis, male    DOB: [birth year], 48 y.o.   MRN: [id number]  Patient presents for Follow-up (is fasting)  Pt here to follow-up chronic medical problems.  Medications reviewed. Diabetes mellitus last A1c 6.6% in August LDL was 46 Currently on Lipitor 20mg  once a day as well as metformin 500 mg twice a day and lisinopril 5 mg daily.  Has not checked his CBG  Also he continued Tricor, though this was suppose to be discontinued in April when he started the lipitor - states it kept coming in the mail, so he took it   At home weight has been up  4-5 pounds , continues to exercise, per our scales he is up 10lbs in past 4 months   GERD continued on omeprazole    Review Of Systems:  GEN- denies fatigue, fever, weight loss,weakness, recent illness HEENT- denies eye drainage, change in vision, nasal discharge, CVS- denies chest pain, palpitations RESP- denies SOB, cough, wheeze ABD- denies N/V, change in stools, abd pain GU- denies dysuria, hematuria, dribbling, incontinence MSK- denies joint pain, muscle aches, injury Neuro- denies headache, dizziness, syncope, seizure activity       Objective:    BP 126/72   Pulse 60   Temp 97.9 F (36.6 C) (Oral)   Resp 12   Ht 6' (1.829 m)   Wt 233 lb (105.7 kg)   SpO2 97%   BMI 31.60 kg/m  GEN- NAD, alert and oriented x3 HEENT- PERRL, EOMI, non injected sclera, pink conjunctiva, MMM, oropharynx clear Neck- Supple, no thyromegaly CVS- RRR, no murmur RESP-CTAB EXT- No edema Pulses- Radial, DP- 2+        Assessment & Plan:      Problem List Items Addressed This Visit      Unprioritized   Diabetes mellitus without complication (HCC) - Primary    Well controlled, recheck A1C if below 6.5% will reduce metformin further Continue ACEI and statin Recommend he check CBG a few times a week fasting Recommend he schedule eye exam, he does wear glasses      Relevant Orders   HM DIABETES FOOT EXAM  (Completed)   Microalbumin / creatinine urine ratio   Comprehensive metabolic panel   Hemoglobin A1c   Lipid panel   Hyperlipidemia    D/C tricor starting today  Continue lipitor 20mg  Check liver function again He continues to work on dietary changes and exercise       Obesity      Note: This dictation was prepared with Nurse, children's dictation along with smaller Lobbyist. Any transcriptional errors that result from this process are unintentional.

## 2018-11-18 NOTE — Assessment & Plan Note (Addendum)
Well controlled, recheck A1C if below 6.5% will reduce metformin further Continue ACEI and statin Recommend he check CBG a few times a week fasting Recommend he schedule eye exam, he does wear glasses

## 2018-11-19 ENCOUNTER — Other Ambulatory Visit: Payer: Self-pay | Admitting: Family Medicine

## 2018-11-19 ENCOUNTER — Other Ambulatory Visit: Payer: Self-pay | Admitting: *Deleted

## 2018-11-19 LAB — COMPREHENSIVE METABOLIC PANEL
AG RATIO: 2.3 (calc) (ref 1.0–2.5)
ALKALINE PHOSPHATASE (APISO): 43 U/L (ref 40–115)
ALT: 27 U/L (ref 9–46)
AST: 19 U/L (ref 10–40)
Albumin: 4.5 g/dL (ref 3.6–5.1)
BILIRUBIN TOTAL: 0.6 mg/dL (ref 0.2–1.2)
BUN: 20 mg/dL (ref 7–25)
CALCIUM: 9.7 mg/dL (ref 8.6–10.3)
CO2: 27 mmol/L (ref 20–32)
Chloride: 104 mmol/L (ref 98–110)
Creat: 1.24 mg/dL (ref 0.60–1.35)
Globulin: 2 g/dL (calc) (ref 1.9–3.7)
Glucose, Bld: 112 mg/dL — ABNORMAL HIGH (ref 65–99)
POTASSIUM: 4.5 mmol/L (ref 3.5–5.3)
Sodium: 139 mmol/L (ref 135–146)
Total Protein: 6.5 g/dL (ref 6.1–8.1)

## 2018-11-19 LAB — LIPID PANEL
Cholesterol: 161 mg/dL (ref <200)
HDL: 49 mg/dL (ref >40)
LDL Cholesterol (Calc): 91 mg/dL (calc)
Non-HDL Cholesterol (Calc): 112 mg/dL (calc) (ref <130)
Total CHOL/HDL Ratio: 3.3 (calc) (ref <5.0)
Triglycerides: 111 mg/dL (ref <150)

## 2018-11-19 LAB — MICROALBUMIN / CREATININE URINE RATIO
CREATININE, URINE: 75 mg/dL (ref 20–320)
MICROALB UR: 0.2 mg/dL
Microalb Creat Ratio: 3 mcg/mg creat (ref <30)

## 2018-11-19 LAB — HEMOGLOBIN A1C
EAG (MMOL/L): 7.4 (calc)
HEMOGLOBIN A1C: 6.3 %{Hb} — AB (ref <5.7)
Mean Plasma Glucose: 134 (calc)

## 2018-11-19 MED ORDER — METFORMIN HCL 500 MG PO TABS: 500.0000 mg | ORAL_TABLET | Freq: Every day | ORAL | 3 refills | Status: DC

## 2019-01-18 ENCOUNTER — Other Ambulatory Visit: Payer: Self-pay | Admitting: Family Medicine

## 2019-01-21 ENCOUNTER — Other Ambulatory Visit: Payer: Self-pay | Admitting: Family Medicine

## 2019-03-10 ENCOUNTER — Encounter: Payer: 59 | Admitting: Family Medicine

## 2019-05-10 ENCOUNTER — Other Ambulatory Visit: Payer: Self-pay | Admitting: Family Medicine

## 2019-10-22 ENCOUNTER — Other Ambulatory Visit: Payer: Self-pay | Admitting: Family Medicine

## 2019-11-16 ENCOUNTER — Other Ambulatory Visit: Payer: Self-pay | Admitting: Family Medicine

## 2019-12-20 ENCOUNTER — Other Ambulatory Visit: Payer: Self-pay | Admitting: Family Medicine

## 2020-01-18 ENCOUNTER — Ambulatory Visit: Payer: 59 | Attending: Internal Medicine

## 2020-01-18 DIAGNOSIS — Z23 Encounter for immunization: Secondary | ICD-10-CM

## 2020-01-18 NOTE — Progress Notes (Signed)
   Covid-19 Vaccination Clinic  Name:  James Francis    MRN: [id number] DOB: [birth year]  01/18/2020  James Francis was observed post Covid-19 immunization for 15 minutes without incident. He was provided with Vaccine Information Sheet and instruction to access the V-Safe system.   James Francis was instructed to call 911 with any severe reactions post vaccine: Marland Kitchen Difficulty breathing  . Swelling of face and throat  . A fast heartbeat  . A bad rash all over body  . Dizziness and weakness   Immunizations Administered    Name Date Dose VIS Date Route   Pfizer COVID-19 Vaccine 01/18/2020 10:35 AM 0.3 mL 10/24/2019 Intramuscular   Manufacturer: [employer], [employer]   Lot: [id number]   NDC: [address]-6

## 2020-02-10 ENCOUNTER — Ambulatory Visit: Payer: 59 | Attending: Internal Medicine

## 2020-02-10 DIAGNOSIS — Z23 Encounter for immunization: Secondary | ICD-10-CM

## 2020-02-10 NOTE — Progress Notes (Signed)
   Covid-19 Vaccination Clinic  Name:  Kitt Ledet    MRN: [id number] DOB: 17-Sep-1971  02/10/2020  Mr. Poteete was observed post Covid-19 immunization for 15 minutes without incident. He was provided with Vaccine Information Sheet and instruction to access the V-Safe system.   Mr. Tangeman was instructed to call 911 with any severe reactions post vaccine: Marland Kitchen Difficulty breathing  . Swelling of face and throat  . A fast heartbeat  . A bad rash all over body  . Dizziness and weakness   Immunizations Administered    Name Date Dose VIS Date Route   Pfizer COVID-19 Vaccine 02/10/2020  8:28 AM 0.3 mL 10/24/2019 Intramuscular   Manufacturer: [employer], [employer]   Lot: [id number]   NDC: [address]-4

## 2020-03-03 ENCOUNTER — Other Ambulatory Visit: Payer: Self-pay | Admitting: Family Medicine

## 2020-03-10 ENCOUNTER — Other Ambulatory Visit: Payer: Self-pay | Admitting: Family Medicine

## 2020-03-11 MED ORDER — ATORVASTATIN CALCIUM 40 MG PO TABS: 40.0000 mg | ORAL_TABLET | Freq: Every day | ORAL | 0 refills | Status: DC

## 2020-03-11 MED ORDER — LISINOPRIL 5 MG PO TABS: 5.0000 mg | ORAL_TABLET | Freq: Every day | ORAL | 0 refills | Status: DC

## 2020-03-31 ENCOUNTER — Encounter: Payer: Self-pay | Admitting: Family Medicine

## 2020-03-31 ENCOUNTER — Ambulatory Visit (INDEPENDENT_AMBULATORY_CARE_PROVIDER_SITE_OTHER): Payer: 59 | Admitting: Family Medicine

## 2020-03-31 ENCOUNTER — Other Ambulatory Visit: Payer: Self-pay

## 2020-03-31 VITALS — BP 110/68 | HR 62 | Temp 97.9°F | Resp 12 | Ht 72.0 in | Wt 228.0 lb

## 2020-03-31 DIAGNOSIS — E119 Type 2 diabetes mellitus without complications: Secondary | ICD-10-CM

## 2020-03-31 DIAGNOSIS — G8929 Other chronic pain: Secondary | ICD-10-CM

## 2020-03-31 DIAGNOSIS — E6609 Other obesity due to excess calories: Secondary | ICD-10-CM

## 2020-03-31 DIAGNOSIS — Z6830 Body mass index (BMI) 30.0-30.9, adult: Secondary | ICD-10-CM

## 2020-03-31 DIAGNOSIS — M79674 Pain in right toe(s): Secondary | ICD-10-CM

## 2020-03-31 DIAGNOSIS — Z0000 Encounter for general adult medical examination without abnormal findings: Secondary | ICD-10-CM

## 2020-03-31 DIAGNOSIS — E782 Mixed hyperlipidemia: Secondary | ICD-10-CM

## 2020-03-31 DIAGNOSIS — Z0001 Encounter for general adult medical examination with abnormal findings: Secondary | ICD-10-CM | POA: Diagnosis not present

## 2020-03-31 DIAGNOSIS — M25511 Pain in right shoulder: Secondary | ICD-10-CM

## 2020-03-31 MED ORDER — METFORMIN HCL 500 MG PO TABS: 500.0000 mg | ORAL_TABLET | Freq: Every day | ORAL | 0 refills | Status: DC

## 2020-03-31 NOTE — Patient Instructions (Addendum)
F/U 6 months  Schedule an eye exam  [address] [place] [address] - [facility]  Xray of right foot and shoulder

## 2020-03-31 NOTE — Progress Notes (Signed)
Subjective:    Patient ID: James Francis, male    DOB: [birth year], 49 y.o.   MRN: [id number]  Patient presents for Annual Exam (is fasting)  Pt here for CPE  Medications and history reviewed  Missed F/U due to COVID-19 Pandemic    DM- last A1C 6.3% in Jan 2020   , taking metformin 500mg  once a day  , Has not changed     Taking lisinopril 5mg  once a day    Weight down 5lbs, since Jan 2020   Hyperlipidemia- Lipitor 20mg  once a day , he has not been on omega 3    Last LDL 91    GERD- he is no longer on reflux meds, has not needed in quite some time   Due for eye exam    dentist UTD    No bowel or urinary issues    Immunizations- COVID-19 UTD, TDAP/FLU/PNA UTD   Right toe pain for past 4-5 months, feels like it needs to crack, no redness. Occ has pain with walking.   Right shoulder pain for the past year, no injury, pain is post shoulder into the blade, occ cracking sound. He intially had neck pain that went away  No changes in activity  does not need daily meds  No tingling or numbness in right hand           Review Of Systems:  GEN- denies fatigue, fever, weight loss,weakness, recent illness HEENT- denies eye drainage, change in vision, nasal discharge, CVS- denies chest pain, palpitations RESP- denies SOB, cough, wheeze ABD- denies N/V, change in stools, abd pain GU- denies dysuria, hematuria, dribbling, incontinence MSK- + joint pain, muscle aches, injury Neuro- denies headache, dizziness, syncope, seizure activity       Objective:    BP 110/68   Pulse 62   Temp 97.9 F (36.6 C) (Temporal)   Resp 12   Ht 6' (1.829 m)   Wt 228 lb (103.4 kg)   SpO2 98%   BMI 30.92 kg/m  GEN- NAD, alert and oriented x3 HEENT- PERRL, EOMI, non injected sclera, pink conjunctiva, MMM, oropharynx clear Neck- Supple, no thyromegaly CVS- RRR, no murmur RESP-CTAB ABD-NABS,soft,NT,ND MSK- FROM upper ext, rotator cuff in tact, shoulder NT, neg impingment signs, biceps in  tact Right great toe, mild TTPat basel of toe, small bunion right foot, FROM ankles EXT- No edema Pulses- Radial, DP- 2+  FALL/DEPRESSION/CAGE screen negative       Assessment & Plan:      Problem List Items Addressed This Visit      Unprioritized   Diabetes mellitus without complication ([place])    He is taking Metformin 500 mg once a day.  His weight is down 5 pounds.  He can start back by Friday for exercise.  If his A1c is still below 6.5% we will try him off of Metformin and have him continue to maintain with his diet.      Relevant Medications   metFORMIN (GLUCOPHAGE) 500 MG tablet   Other Relevant Orders   Microalbumin / creatinine urine ratio   HM DIABETES FOOT EXAM (Completed)   CBC with Differential/Platelet   Comprehensive metabolic panel   Hemoglobin A1c   Hyperlipidemia    Recheck lipid panel he is on Lipitor 20 mg once a day.      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 Any transcriptional errors that result from this process are unintentional.

## 2020-03-31 NOTE — Assessment & Plan Note (Signed)
Recheck lipid panel he is on Lipitor 20 mg once a day.

## 2020-03-31 NOTE — Assessment & Plan Note (Signed)
He is taking Metformin 500 mg once a day.  His weight is down 5 pounds.  He can start back by Friday for exercise.  If his A1c is still below 6.5% we will try him off of Metformin and have him continue to maintain with his diet.

## 2020-04-01 LAB — CBC WITH DIFFERENTIAL/PLATELET
Absolute Monocytes: 467 cells/uL (ref 200–950)
Basophils Absolute: 58 cells/uL (ref 0–200)
Basophils Relative: 0.8 %
Eosinophils Absolute: 153 cells/uL (ref 15–500)
Eosinophils Relative: 2.1 %
HCT: 44.3 % (ref 38.5–50.0)
Hemoglobin: 14.6 g/dL (ref 13.2–17.1)
Lymphs Abs: 3884 cells/uL (ref 850–3900)
MCH: 28 pg (ref 27.0–33.0)
MCHC: 33 g/dL (ref 32.0–36.0)
MCV: 84.9 fL (ref 80.0–100.0)
MPV: 10.5 fL (ref 7.5–12.5)
Monocytes Relative: 6.4 %
Neutro Abs: 2738 cells/uL (ref 1500–7800)
Neutrophils Relative %: 37.5 %
Platelets: 230 10*3/uL (ref 140–400)
RBC: 5.22 10*6/uL (ref 4.20–5.80)
RDW: 13.3 % (ref 11.0–15.0)
Total Lymphocyte: 53.2 %
WBC: 7.3 10*3/uL (ref 3.8–10.8)

## 2020-04-01 LAB — HEMOGLOBIN A1C
Hgb A1c MFr Bld: 7 % of total Hgb — ABNORMAL HIGH (ref <5.7)
Mean Plasma Glucose: 154 (calc)
eAG (mmol/L): 8.5 (calc)

## 2020-04-01 LAB — LIPID PANEL
Cholesterol: 170 mg/dL (ref <200)
HDL: 42 mg/dL (ref >=40)
LDL Cholesterol (Calc): 91 mg/dL (calc)
Non-HDL Cholesterol (Calc): 128 mg/dL (calc) (ref <130)
Total CHOL/HDL Ratio: 4 (calc) (ref <5.0)
Triglycerides: 279 mg/dL — ABNORMAL HIGH (ref <150)

## 2020-04-01 LAB — COMPREHENSIVE METABOLIC PANEL
AG Ratio: 2.3 (calc) (ref 1.0–2.5)
ALT: 18 U/L (ref 9–46)
AST: 15 U/L (ref 10–40)
Albumin: 4.5 g/dL (ref 3.6–5.1)
Alkaline phosphatase (APISO): 57 U/L (ref 36–130)
BUN: 14 mg/dL (ref 7–25)
CO2: 25 mmol/L (ref 20–32)
Calcium: 9.5 mg/dL (ref 8.6–10.3)
Chloride: 104 mmol/L (ref 98–110)
Creat: 1.24 mg/dL (ref 0.60–1.35)
Globulin: 2 g/dL (calc) (ref 1.9–3.7)
Glucose, Bld: 108 mg/dL — ABNORMAL HIGH (ref 65–99)
Potassium: 4.3 mmol/L (ref 3.5–5.3)
Sodium: 139 mmol/L (ref 135–146)
Total Bilirubin: 1.2 mg/dL (ref 0.2–1.2)
Total Protein: 6.5 g/dL (ref 6.1–8.1)

## 2020-04-01 LAB — URIC ACID: Uric Acid, Serum: 5.4 mg/dL (ref 4.0–8.0)

## 2020-04-01 LAB — MICROALBUMIN / CREATININE URINE RATIO
Creatinine, Urine: 57 mg/dL (ref 20–320)
Microalb Creat Ratio: 4 mcg/mg creat (ref <30)
Microalb, Ur: 0.2 mg/dL

## 2020-04-07 ENCOUNTER — Ambulatory Visit
Admission: RE | Admit: 2020-04-07 | Discharge: 2020-04-07 | Disposition: A | Discharge status: Home / Self Care | Payer: 59 | Source: Ambulatory Visit | Attending: Family Medicine | Admitting: Family Medicine

## 2020-04-07 DIAGNOSIS — M79674 Pain in right toe(s): Secondary | ICD-10-CM

## 2020-04-07 DIAGNOSIS — G8929 Other chronic pain: Secondary | ICD-10-CM

## 2020-04-09 ENCOUNTER — Other Ambulatory Visit: Payer: Self-pay | Admitting: *Deleted

## 2020-04-09 DIAGNOSIS — G8929 Other chronic pain: Secondary | ICD-10-CM

## 2020-04-09 DIAGNOSIS — M79674 Pain in right toe(s): Secondary | ICD-10-CM

## 2020-04-09 MED ORDER — METFORMIN HCL 500 MG PO TABS: 500.0000 mg | ORAL_TABLET | Freq: Two times a day (BID) | ORAL | 0 refills | Status: DC

## 2020-04-27 ENCOUNTER — Ambulatory Visit: Payer: 59 | Admitting: Orthopaedic Surgery

## 2020-04-27 ENCOUNTER — Ambulatory Visit: Payer: Self-pay

## 2020-04-27 ENCOUNTER — Other Ambulatory Visit: Payer: Self-pay

## 2020-04-27 ENCOUNTER — Encounter: Payer: Self-pay | Admitting: Orthopaedic Surgery

## 2020-04-27 VITALS — Ht 72.0 in | Wt 222.0 lb

## 2020-04-27 DIAGNOSIS — M542 Cervicalgia: Secondary | ICD-10-CM

## 2020-04-27 NOTE — Progress Notes (Signed)
Office Visit Note   Patient: James Francis           Date of Birth: September 02, 1971           MRN: [id number] Visit Date: 04/27/2020              Requested by: Salley Scarlet, MD [address] [place],  [place] [address] PCP: Salley Scarlet, MD   Assessment & Plan: Visit Diagnoses:  1. Neck pain     Plan: Impression is right scapular trigger points.  I demonstrated exercises to him in the office.  I have also recommended using heat.  Have recommended home exercise program through physical therapy.  He may also want to consider acupuncture or dry needling.  We will see him back as needed  Follow-Up Instructions: Return if symptoms worsen or fail to improve.   Orders:  Orders Placed This Encounter  Procedures  . XR Cervical Spine 2 or 3 views   No orders of the defined types were placed in this encounter.     Procedures: No procedures performed   Clinical Data: No additional findings.   Subjective: Chief Complaint  Patient presents with  . 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  Denies any injuries.  Denies any numbness and tingling or radicular symptoms.  Not taking any pain medications.  He states that is usually bothersome in the morning and gets better with stretching.  He has not taken any NSAIDs for this.   Review of Systems  Constitutional: Negative.   All other systems reviewed and are negative.    Objective: Vital Signs: Ht 6' (1.829 m)   Wt 222 lb (100.7 kg)   BMI 30.11 kg/m   Physical Exam Vitals and nursing note reviewed.  Constitutional:      Appearance: He is well-developed.  HENT:     Head: Normocephalic and atraumatic.  Eyes:     Pupils: Pupils are equal, round, and reactive to light.  Pulmonary:     Effort: Pulmonary effort is normal.  Abdominal:     Palpations: Abdomen  is soft.  Musculoskeletal:        General: Normal range of motion.     Cervical back: Neck supple.  Skin:    General: Skin is warm.  Neurological:     Mental Status: He is alert and oriented to person, place, and time.  Psychiatric:        Behavior: Behavior normal.        Thought Content: Thought content normal.        Judgment: Judgment normal.     Ortho Exam Cervical spine exam is unremarkable.  Shoulder exam is unremarkable.  He does not have significant tenderness in the rhomboids although this is where he states the symptoms are mainly localized. Specialty Comments:  No specialty comments available.  Imaging: XR Cervical Spine 2 or 3 views  Result Date: 04/27/2020 No acute or structural abnormalities    PMFS History: Patient Active Problem List   Diagnosis Date Noted  . Bulging lumbar disc 08/12/2014  . Left knee pain 02/02/2014  . Diabetes mellitus without complication (HCC) 10/13/2013  . Hyperlipidemia 08/04/2013  . Obesity 08/04/2013   Past Medical History:  Diagnosis Date  . Colitis    Indeterminate cause  .  Hearing loss    High frequency bilateral  . History of broken nose    Multiple- has deviated septum  . History of colon polyps   . Hyperlipidemia   . Ruptured lumbar disc    L4-L5 as a child    Family History  Problem Relation Age of Onset  . Cancer Mother        unknown type  . Kidney Stones Mother   . Heart disease Father        MI age 61, triple Bypass/ death age 31  . Cancer Maternal Grandmother        Ovarian  . Heart disease Paternal Grandfather        MI  . Diabetes Paternal Grandfather   . Crohn's disease Paternal Aunt   . Colon cancer Neg Hx   . Stomach cancer Neg Hx   . Rectal cancer Neg Hx   . Esophageal cancer Neg Hx   . Liver cancer Neg Hx     Past Surgical History:  Procedure Laterality Date  . NO PAST SURGERIES     Social History   Occupational History  . Occupation: Chief Financial Officer  Tobacco Use  . Smoking status:  Never Smoker  . Smokeless tobacco: Never Used  Substance and Sexual Activity  . Alcohol use: Yes    Alcohol/week: 0.0 standard drinks    Comment: occasionally   . Drug use: No  . Sexual activity: Not Currently

## 2020-05-19 ENCOUNTER — Other Ambulatory Visit: Payer: Self-pay

## 2020-05-19 ENCOUNTER — Ambulatory Visit (INDEPENDENT_AMBULATORY_CARE_PROVIDER_SITE_OTHER): Payer: 59

## 2020-05-19 ENCOUNTER — Ambulatory Visit: Payer: 59 | Admitting: Podiatry

## 2020-05-19 DIAGNOSIS — M2041 Other hammer toe(s) (acquired), right foot: Secondary | ICD-10-CM

## 2020-05-19 DIAGNOSIS — M7751 Other enthesopathy of right foot: Secondary | ICD-10-CM

## 2020-05-19 DIAGNOSIS — M79674 Pain in right toe(s): Secondary | ICD-10-CM

## 2020-05-20 ENCOUNTER — Other Ambulatory Visit: Payer: Self-pay | Admitting: Podiatry

## 2020-05-20 ENCOUNTER — Encounter: Payer: Self-pay | Admitting: Podiatry

## 2020-05-20 DIAGNOSIS — M2041 Other hammer toe(s) (acquired), right foot: Secondary | ICD-10-CM

## 2020-05-20 NOTE — Progress Notes (Signed)
Subjective:  Patient ID: James Francis, male    DOB: 1971-03-01,  MRN: [id number]  Chief Complaint  Patient presents with  . Toe Pain    pt is here for pain in the right great toe    49 y.o. male presents with the above complaint.  Patient presents with a complaint of right hallux pain that has been going on for 9 months has progressively gotten worse.  Patient states it is came on slowly and never got better.  Is painful to walk on pain is on and off there is shooting tingling associated with it.  Patient states that he has not seen anyone else prior to seeing me.  He has not tried different treatment options.  He states that it hurts when ambulating.  His pain scale is 8 out of 10.  Is sharp shooting in nature.   Review of Systems: Negative except as noted in the HPI. Denies N/V/F/Ch.  Past Medical History:  Diagnosis Date  . Colitis    Indeterminate cause  . Hearing loss    High frequency bilateral  . History of broken nose    Multiple- has deviated septum  . History of colon polyps   . Hyperlipidemia   . Ruptured lumbar disc    L4-L5 as a child    Current Outpatient Medications:  .  atorvastatin (LIPITOR) 40 MG tablet, Take 1 tablet (40 mg total) by mouth daily., Disp: 90 tablet, Rfl: 0 .  Glucose Blood (BLOOD GLUCOSE TEST STRIPS) STRP, Please dispense based on patient and insurance preference. 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  lisinopril (ZESTRIL) 5 MG tablet, Take 1 tablet (5 mg total) by mouth daily., Disp: 90 tablet, Rfl: 0 .  metFORMIN (GLUCOPHAGE) 500 MG tablet, Take 1 tablet (500 mg total) by mouth 2 (two) times daily with a meal., Disp: 180 tablet, Rfl: 0  Social History   Tobacco Use  Smoking Status Never Smoker  Smokeless Tobacco Never Used    No Known Allergies Objective:  There were no vitals filed for this visit. There is no height or weight on file to calculate BMI. Constitutional Well developed. Well  nourished.  Vascular Dorsalis pedis pulses palpable bilaterally. Posterior tibial pulses palpable bilaterally. Capillary refill normal to all digits.  No cyanosis or clubbing noted. Pedal hair growth normal.  Neurologic Normal speech. Oriented to person, place, and time. Epicritic sensation to light touch grossly present bilaterally.  Dermatologic Nails well groomed and normal in appearance. No open wounds. No skin lesions.  Orthopedic:  Pain on palpation to the plantar tuft of the distal phalanx of the hallux right side.  Mild pain with range of motion of the interphalangeal joint of the hallux.  No pain at the metatarsophalangeal joint active and passive range of motion.  Pain with resisted plantarflexion of the digits.  No pain with resisted dorsiflexion of the digits.   Radiographs: 3 views of skeletally mature adult right foot: There appears to be a history of fracture that may have well-healed noted at the distal phalanx.  Mild arthrosis noted at the interphalangeal joint of the distal phalanx.  No other bony abnormalities identified.  No sesamoid noted.  No other acute fractures noted. Assessment:   1. Pain of toe of right foot    Plan:  Patient was evaluated and treated and all questions answered.  Right hallux IPJ capsulitis -I explained patient the etiology of capsulitis and various treatment options  were discussed.  I believe patient will benefit from a steroid injection to help decrease inflammatory component associated with pain.  Patient does not recall if he injured it or any history of trauma to the area however he may have stubbed it in the past that could have resulted in mild arthritic changes to the interphalangeal joint.  Patient agrees with the plan would like to proceed with a steroid injection -A steroid injection was performed at right hallux IPJ using 1% plain Lidocaine and 10 mg of Kenalog. This was well tolerated.

## 2020-05-30 ENCOUNTER — Other Ambulatory Visit: Payer: Self-pay | Admitting: Family Medicine

## 2020-06-19 ENCOUNTER — Other Ambulatory Visit: Payer: Self-pay | Admitting: Family Medicine

## 2020-06-25 ENCOUNTER — Ambulatory Visit: Payer: 59 | Admitting: Podiatry

## 2020-09-15 ENCOUNTER — Other Ambulatory Visit: Payer: Self-pay | Admitting: Family Medicine

## 2020-10-04 ENCOUNTER — Encounter: Payer: Self-pay | Admitting: Family Medicine

## 2020-10-04 ENCOUNTER — Ambulatory Visit: Payer: 59 | Admitting: Family Medicine

## 2020-10-04 ENCOUNTER — Other Ambulatory Visit: Payer: Self-pay

## 2020-10-04 VITALS — BP 124/72 | HR 60 | Temp 98.1°F | Resp 14 | Ht 72.0 in | Wt 224.0 lb

## 2020-10-04 DIAGNOSIS — E782 Mixed hyperlipidemia: Secondary | ICD-10-CM | POA: Diagnosis not present

## 2020-10-04 DIAGNOSIS — Z23 Encounter for immunization: Secondary | ICD-10-CM

## 2020-10-04 DIAGNOSIS — B351 Tinea unguium: Secondary | ICD-10-CM | POA: Diagnosis not present

## 2020-10-04 DIAGNOSIS — E119 Type 2 diabetes mellitus without complications: Secondary | ICD-10-CM | POA: Diagnosis not present

## 2020-10-04 DIAGNOSIS — E6609 Other obesity due to excess calories: Secondary | ICD-10-CM

## 2020-10-04 DIAGNOSIS — Z6830 Body mass index (BMI) 30.0-30.9, adult: Secondary | ICD-10-CM

## 2020-10-04 MED ORDER — TERBINAFINE HCL 250 MG PO TABS: 250.0000 mg | ORAL_TABLET | Freq: Every day | ORAL | 0 refills | Status: DC

## 2020-10-04 NOTE — Assessment & Plan Note (Signed)
Goal less than 7%  but prefer closer to  6.5% or below in setting of Hyperlipidemia, increased risk for Heart disease Continue metformin, ACEI Pt to schedule eye exam  Flu shot given

## 2020-10-04 NOTE — Patient Instructions (Addendum)
Hold lipitor while taking lamisil for 6 weeks We will call with lab results Schedule and eye exam F/U 6 months for Physical

## 2020-10-04 NOTE — Progress Notes (Signed)
   Subjective:    Patient ID: James Francis, male    DOB: 1971/05/09, 49 y.o.   MRN: [id number]  Patient presents for Follow-up (is fasting) Here follow-up chronic medical problems.  Medications reviewed.  Diabetes mellitus last A1c 7%, currently on Metformin 500 mg twice a day CBGs at home range - hasnt  Renal protection he is on lisinopril 5 mg daily  Hyperlipidemia currently on Lipitor 40 mg once a day triglycerides were elevated significantly back in May 279  Due for eye exam  Immunizations - COVID-19 UTD / Flu shot?   His only other concern is toe discomfort.  He was seen by podiatry months ago.  Uric acid was negative his x-rays done and were negative.  He was told that he had capsulitis and was getting steroid injections help.  He would also like to resume Lamisil for fungus on the end of the month  Review Of Systems:  GEN- denies fatigue, fever, weight loss,weakness, recent illness HEENT- denies eye drainage, change in vision, nasal discharge, CVS- denies chest pain, palpitations RESP- denies SOB, cough, wheeze ABD- denies N/V, change in stools, abd pain GU- denies dysuria, hematuria, dribbling, incontinence MSK- denies joint pain, muscle aches, injury Neuro- denies headache, dizziness, syncope, seizure activity       Objective:    BP 124/72   Pulse 60   Temp 98.1 F (36.7 C) (Temporal)   Resp 14   Ht 6' (1.829 m)   Wt 224 lb (101.6 kg)   SpO2 98%   BMI 30.38 kg/m  GEN- NAD, alert and oriented x3 HEENT- PERRL, EOMI, non injected sclera, pink conjunctiva, MMM, oropharynx clear Neck- Supple, no thyromegaly CVS- RRR, no murmur RESP-CTAB ABD-NABS,soft,NT,ND EXT- No edema Skin Right great toe, mild TTP base of tone, no swelling FROM, yellowing thickened nail distal 1/2 nail  Pulses- Radial, DP- 2+        Assessment & Plan:   Toe pain aDVISED can return to podiatry  or try inserts in shoes    Problem List Items Addressed This Visit      Unprioritized    Diabetes mellitus without complication (HCC) - Primary    Goal less than 7%  but prefer closer to  6.5% or below in setting of Hyperlipidemia, increased risk for Heart disease Continue metformin, ACEI Pt to schedule eye exam  Flu shot given       Relevant Orders   CBC with Differential/Platelet   Comprehensive metabolic panel   Hemoglobin A1c   Hyperlipidemia    Recheck lipids on statin drug lipitor 40mg  daily       Relevant Orders   Lipid panel   Obesity    Other Visit Diagnoses    Onychomycosis of right great toe        Given lamisil 250mg  once a day for 6 weeks, hold statin while on medication    Relevant Medications   terbinafine (LAMISIL) 250 MG tablet   Need for immunization against influenza       Relevant Orders   Flu Vaccine QUAD 36+ mos IM (Completed)      Note: This dictation was prepared with Dragon dictation along with smaller phrase technology. Any transcriptional errors that result from this process are unintentional.

## 2020-10-04 NOTE — Assessment & Plan Note (Signed)
Recheck lipids on statin drug lipitor 40mg  daily

## 2020-10-05 LAB — CBC WITH DIFFERENTIAL/PLATELET
Absolute Monocytes: 391 cells/uL (ref 200–950)
Basophils Absolute: 50 cells/uL (ref 0–200)
Basophils Relative: 0.8 %
Eosinophils Absolute: 183 cells/uL (ref 15–500)
Eosinophils Relative: 2.9 %
HCT: 42.6 % (ref 38.5–50.0)
Hemoglobin: 14.2 g/dL (ref 13.2–17.1)
Lymphs Abs: 3068 cells/uL (ref 850–3900)
MCH: 28.3 pg (ref 27.0–33.0)
MCHC: 33.3 g/dL (ref 32.0–36.0)
MCV: 85 fL (ref 80.0–100.0)
MPV: 10.1 fL (ref 7.5–12.5)
Monocytes Relative: 6.2 %
Neutro Abs: 2608 cells/uL (ref 1500–7800)
Neutrophils Relative %: 41.4 %
Platelets: 203 10*3/uL (ref 140–400)
RBC: 5.01 10*6/uL (ref 4.20–5.80)
RDW: 12.7 % (ref 11.0–15.0)
Total Lymphocyte: 48.7 %
WBC: 6.3 10*3/uL (ref 3.8–10.8)

## 2020-10-05 LAB — COMPREHENSIVE METABOLIC PANEL
AG Ratio: 2.2 (calc) (ref 1.0–2.5)
ALT: 18 U/L (ref 9–46)
AST: 13 U/L (ref 10–40)
Albumin: 4.4 g/dL (ref 3.6–5.1)
Alkaline phosphatase (APISO): 55 U/L (ref 36–130)
BUN: 13 mg/dL (ref 7–25)
CO2: 24 mmol/L (ref 20–32)
Calcium: 9.1 mg/dL (ref 8.6–10.3)
Chloride: 104 mmol/L (ref 98–110)
Creat: 1.04 mg/dL (ref 0.60–1.35)
Globulin: 2 g/dL (calc) (ref 1.9–3.7)
Glucose, Bld: 170 mg/dL — ABNORMAL HIGH (ref 65–99)
Potassium: 4.1 mmol/L (ref 3.5–5.3)
Sodium: 138 mmol/L (ref 135–146)
Total Bilirubin: 0.8 mg/dL (ref 0.2–1.2)
Total Protein: 6.4 g/dL (ref 6.1–8.1)

## 2020-10-05 LAB — LIPID PANEL
Cholesterol: 133 mg/dL (ref <200)
HDL: 41 mg/dL (ref >=40)
LDL Cholesterol (Calc): 66 mg/dL (calc)
Non-HDL Cholesterol (Calc): 92 mg/dL (calc) (ref <130)
Total CHOL/HDL Ratio: 3.2 (calc) (ref <5.0)
Triglycerides: 193 mg/dL — ABNORMAL HIGH (ref <150)

## 2020-10-05 LAB — HEMOGLOBIN A1C
Hgb A1c MFr Bld: 8.4 % of total Hgb — ABNORMAL HIGH (ref <5.7)
Mean Plasma Glucose: 194 (calc)
eAG (mmol/L): 10.8 (calc)

## 2020-10-15 ENCOUNTER — Other Ambulatory Visit: Payer: Self-pay | Admitting: *Deleted

## 2020-10-15 MED ORDER — METFORMIN HCL 1000 MG PO TABS
500.0000 mg | ORAL_TABLET | Freq: Two times a day (BID) | ORAL | 1 refills | Status: DC
Start: 2020-10-15 — End: 2020-10-15

## 2020-10-15 MED ORDER — METFORMIN HCL 1000 MG PO TABS
1000.0000 mg | ORAL_TABLET | Freq: Two times a day (BID) | ORAL | 3 refills | Status: DC
Start: 2020-10-15 — End: 2020-11-01

## 2020-10-15 NOTE — Addendum Note (Signed)
Addended by: Phillips Odor on: 10/15/2020 02:30 PM   Modules accepted: Orders

## 2020-11-01 ENCOUNTER — Telehealth: Payer: Self-pay | Admitting: *Deleted

## 2020-11-01 MED ORDER — METFORMIN HCL 1000 MG PO TABS
500.0000 mg | ORAL_TABLET | Freq: Two times a day (BID) | ORAL | 3 refills | Status: DC
Start: 2020-11-01 — End: 2023-11-26

## 2020-11-01 NOTE — Telephone Encounter (Signed)
Received call from patient.   Reports that he has concerns about MTF. States that he was taking MTF 500mg  PO QD and now is supposed to take 1000mg  PO BID. Inquired as to if he should increase medication that much.   Per PCP, patient was supposed to be on MTF 500mg  PO BID. Advised that since he was not taking as much as directed, increase dose to 500mg  PO BID. Repeat A1C in 3 months.   Medication list updated.

## 2020-12-13 ENCOUNTER — Inpatient Hospital Stay (HOSPITAL_COMMUNITY)
Admission: EM | Admit: 2020-12-13 | Discharge: 2020-12-15 | DRG: 247 | Disposition: A | Discharge status: Home / Self Care | Payer: 59 | Attending: Cardiovascular Disease | Admitting: Cardiovascular Disease

## 2020-12-13 ENCOUNTER — Emergency Department (HOSPITAL_COMMUNITY): Payer: 59

## 2020-12-13 ENCOUNTER — Other Ambulatory Visit: Payer: Self-pay

## 2020-12-13 DIAGNOSIS — I071 Rheumatic tricuspid insufficiency: Secondary | ICD-10-CM | POA: Diagnosis present

## 2020-12-13 DIAGNOSIS — I255 Ischemic cardiomyopathy: Secondary | ICD-10-CM

## 2020-12-13 DIAGNOSIS — I10 Essential (primary) hypertension: Secondary | ICD-10-CM | POA: Diagnosis present

## 2020-12-13 DIAGNOSIS — E119 Type 2 diabetes mellitus without complications: Secondary | ICD-10-CM

## 2020-12-13 DIAGNOSIS — Z79899 Other long term (current) drug therapy: Secondary | ICD-10-CM

## 2020-12-13 DIAGNOSIS — Z809 Family history of malignant neoplasm, unspecified: Secondary | ICD-10-CM

## 2020-12-13 DIAGNOSIS — I214 Non-ST elevation (NSTEMI) myocardial infarction: Secondary | ICD-10-CM | POA: Diagnosis present

## 2020-12-13 DIAGNOSIS — I2109 ST elevation (STEMI) myocardial infarction involving other coronary artery of anterior wall: Principal | ICD-10-CM | POA: Diagnosis present

## 2020-12-13 DIAGNOSIS — I351 Nonrheumatic aortic (valve) insufficiency: Secondary | ICD-10-CM | POA: Diagnosis present

## 2020-12-13 DIAGNOSIS — Z8719 Personal history of other diseases of the digestive system: Secondary | ICD-10-CM

## 2020-12-13 DIAGNOSIS — H9190 Unspecified hearing loss, unspecified ear: Secondary | ICD-10-CM | POA: Diagnosis present

## 2020-12-13 DIAGNOSIS — I77810 Thoracic aortic ectasia: Secondary | ICD-10-CM | POA: Diagnosis present

## 2020-12-13 DIAGNOSIS — R079 Chest pain, unspecified: Secondary | ICD-10-CM | POA: Diagnosis not present

## 2020-12-13 DIAGNOSIS — Z955 Presence of coronary angioplasty implant and graft: Secondary | ICD-10-CM

## 2020-12-13 DIAGNOSIS — Z20822 Contact with and (suspected) exposure to covid-19: Secondary | ICD-10-CM | POA: Diagnosis present

## 2020-12-13 DIAGNOSIS — E782 Mixed hyperlipidemia: Secondary | ICD-10-CM | POA: Diagnosis present

## 2020-12-13 DIAGNOSIS — E785 Hyperlipidemia, unspecified: Secondary | ICD-10-CM | POA: Diagnosis present

## 2020-12-13 DIAGNOSIS — Z833 Family history of diabetes mellitus: Secondary | ICD-10-CM

## 2020-12-13 DIAGNOSIS — Z7984 Long term (current) use of oral hypoglycemic drugs: Secondary | ICD-10-CM

## 2020-12-13 DIAGNOSIS — Z8249 Family history of ischemic heart disease and other diseases of the circulatory system: Secondary | ICD-10-CM

## 2020-12-13 HISTORY — DX: Type 2 diabetes mellitus without complications: E11.9

## 2020-12-13 HISTORY — DX: Atherosclerotic heart disease of native coronary artery without angina pectoris: I25.10

## 2020-12-13 HISTORY — DX: Essential (primary) hypertension: I10

## 2020-12-13 LAB — BASIC METABOLIC PANEL
Anion gap: 11 (ref 5–15)
BUN: 13 mg/dL (ref 6–20)
CO2: 26 mmol/L (ref 22–32)
Calcium: 9.7 mg/dL (ref 8.9–10.3)
Chloride: 101 mmol/L (ref 98–111)
Creatinine, Ser: 0.99 mg/dL (ref 0.61–1.24)
GFR, Estimated: >60 mL/min (ref >60)
Glucose, Bld: 183 mg/dL — ABNORMAL HIGH (ref 70–99)
Potassium: 4.3 mmol/L (ref 3.5–5.1)
Sodium: 138 mmol/L (ref 135–145)

## 2020-12-13 LAB — CBC
HCT: 46.2 % (ref 39.0–52.0)
Hemoglobin: 15.7 g/dL (ref 13.0–17.0)
MCH: 29 pg (ref 26.0–34.0)
MCHC: 34 g/dL (ref 30.0–36.0)
MCV: 85.2 fL (ref 80.0–100.0)
Platelets: 271 10*3/uL (ref 150–400)
RBC: 5.42 MIL/uL (ref 4.22–5.81)
RDW: 12.9 % (ref 11.5–15.5)
WBC: 9.7 10*3/uL (ref 4.0–10.5)
nRBC: 0 % (ref 0.0–0.2)

## 2020-12-13 LAB — TROPONIN I (HIGH SENSITIVITY)
Troponin I (High Sensitivity): 110 ng/L (ref <18)
Troponin I (High Sensitivity): 103 ng/L (ref <18)

## 2020-12-13 NOTE — ED Notes (Signed)
Dr Clayborne Dana notified of Troponin 110.

## 2020-12-13 NOTE — ED Triage Notes (Signed)
Pt reports every twenty mins since Saturday he has central chest and bilateral arm pain. Pain increases, lasts a few mins, then goes away. Pain worsens with movement.

## 2020-12-14 ENCOUNTER — Encounter (HOSPITAL_COMMUNITY)
Admission: EM | Disposition: A | Discharge status: Home / Self Care | Payer: Self-pay | Source: Home / Self Care | Attending: Internal Medicine

## 2020-12-14 ENCOUNTER — Inpatient Hospital Stay (HOSPITAL_COMMUNITY): Payer: 59

## 2020-12-14 ENCOUNTER — Encounter (HOSPITAL_COMMUNITY): Payer: Self-pay | Admitting: Interventional Cardiology

## 2020-12-14 DIAGNOSIS — R079 Chest pain, unspecified: Secondary | ICD-10-CM | POA: Diagnosis present

## 2020-12-14 DIAGNOSIS — I248 Other forms of acute ischemic heart disease: Secondary | ICD-10-CM | POA: Diagnosis not present

## 2020-12-14 DIAGNOSIS — I071 Rheumatic tricuspid insufficiency: Secondary | ICD-10-CM | POA: Diagnosis present

## 2020-12-14 DIAGNOSIS — I2109 ST elevation (STEMI) myocardial infarction involving other coronary artery of anterior wall: Secondary | ICD-10-CM | POA: Diagnosis present

## 2020-12-14 DIAGNOSIS — I2510 Atherosclerotic heart disease of native coronary artery without angina pectoris: Secondary | ICD-10-CM

## 2020-12-14 DIAGNOSIS — Z809 Family history of malignant neoplasm, unspecified: Secondary | ICD-10-CM | POA: Diagnosis not present

## 2020-12-14 DIAGNOSIS — Z7984 Long term (current) use of oral hypoglycemic drugs: Secondary | ICD-10-CM | POA: Diagnosis not present

## 2020-12-14 DIAGNOSIS — I77810 Thoracic aortic ectasia: Secondary | ICD-10-CM | POA: Diagnosis present

## 2020-12-14 DIAGNOSIS — Z8719 Personal history of other diseases of the digestive system: Secondary | ICD-10-CM | POA: Diagnosis not present

## 2020-12-14 DIAGNOSIS — H9190 Unspecified hearing loss, unspecified ear: Secondary | ICD-10-CM | POA: Diagnosis present

## 2020-12-14 DIAGNOSIS — Z79899 Other long term (current) drug therapy: Secondary | ICD-10-CM | POA: Diagnosis not present

## 2020-12-14 DIAGNOSIS — I10 Essential (primary) hypertension: Secondary | ICD-10-CM | POA: Diagnosis present

## 2020-12-14 DIAGNOSIS — I214 Non-ST elevation (NSTEMI) myocardial infarction: Secondary | ICD-10-CM | POA: Diagnosis not present

## 2020-12-14 DIAGNOSIS — I255 Ischemic cardiomyopathy: Secondary | ICD-10-CM | POA: Diagnosis present

## 2020-12-14 DIAGNOSIS — E785 Hyperlipidemia, unspecified: Secondary | ICD-10-CM | POA: Diagnosis not present

## 2020-12-14 DIAGNOSIS — Z833 Family history of diabetes mellitus: Secondary | ICD-10-CM | POA: Diagnosis not present

## 2020-12-14 DIAGNOSIS — E119 Type 2 diabetes mellitus without complications: Secondary | ICD-10-CM | POA: Diagnosis present

## 2020-12-14 DIAGNOSIS — Z8249 Family history of ischemic heart disease and other diseases of the circulatory system: Secondary | ICD-10-CM | POA: Diagnosis not present

## 2020-12-14 DIAGNOSIS — I351 Nonrheumatic aortic (valve) insufficiency: Secondary | ICD-10-CM | POA: Diagnosis present

## 2020-12-14 DIAGNOSIS — E782 Mixed hyperlipidemia: Secondary | ICD-10-CM | POA: Diagnosis present

## 2020-12-14 DIAGNOSIS — Z20822 Contact with and (suspected) exposure to covid-19: Secondary | ICD-10-CM | POA: Diagnosis present

## 2020-12-14 HISTORY — PX: CORONARY ULTRASOUND/IVUS: CATH118244

## 2020-12-14 HISTORY — PX: LEFT HEART CATH AND CORONARY ANGIOGRAPHY: CATH118249

## 2020-12-14 HISTORY — PX: CORONARY STENT INTERVENTION: CATH118234

## 2020-12-14 LAB — ECHOCARDIOGRAM COMPLETE
AR max vel: 3.41 cm2
AV Area VTI: 3.12 cm2
AV Area mean vel: 3.04 cm2
AV Mean grad: 2 mmHg
AV Peak grad: 3.3 mmHg
Ao pk vel: 0.92 m/s
Area-P 1/2: 2.22 cm2
Height: 72 in
MV VTI: 2.17 cm2
P 1/2 time: 736 msec
S' Lateral: 2.8 cm
Weight: 3760 oz

## 2020-12-14 LAB — TSH: TSH: 1.219 u[IU]/mL (ref 0.350–4.500)

## 2020-12-14 LAB — CBG MONITORING, ED: Glucose-Capillary: 184 mg/dL — ABNORMAL HIGH (ref 70–99)

## 2020-12-14 LAB — POCT ACTIVATED CLOTTING TIME
Activated Clotting Time: 398 seconds
Activated Clotting Time: 713 seconds

## 2020-12-14 LAB — COMPREHENSIVE METABOLIC PANEL
ALT: 24 U/L (ref 0–44)
AST: 22 U/L (ref 15–41)
Albumin: 4 g/dL (ref 3.5–5.0)
Alkaline Phosphatase: 49 U/L (ref 38–126)
Anion gap: 13 (ref 5–15)
BUN: 13 mg/dL (ref 6–20)
CO2: 23 mmol/L (ref 22–32)
Calcium: 9.1 mg/dL (ref 8.9–10.3)
Chloride: 101 mmol/L (ref 98–111)
Creatinine, Ser: 1.02 mg/dL (ref 0.61–1.24)
GFR, Estimated: >60 mL/min (ref >60)
Glucose, Bld: 201 mg/dL — ABNORMAL HIGH (ref 70–99)
Potassium: 3.9 mmol/L (ref 3.5–5.1)
Sodium: 137 mmol/L (ref 135–145)
Total Bilirubin: 1.1 mg/dL (ref 0.3–1.2)
Total Protein: 6.4 g/dL — ABNORMAL LOW (ref 6.5–8.1)

## 2020-12-14 LAB — MAGNESIUM: Magnesium: 1.8 mg/dL (ref 1.7–2.4)

## 2020-12-14 LAB — HIV ANTIBODY (ROUTINE TESTING W REFLEX): HIV Screen 4th Generation wRfx: NONREACTIVE

## 2020-12-14 LAB — LIPID PANEL
Cholesterol: 179 mg/dL (ref 0–200)
HDL: 44 mg/dL (ref >40)
LDL Cholesterol: 66 mg/dL (ref 0–99)
Total CHOL/HDL Ratio: 4.1 RATIO
Triglycerides: 345 mg/dL — ABNORMAL HIGH (ref <150)
VLDL: 69 mg/dL — ABNORMAL HIGH (ref 0–40)

## 2020-12-14 LAB — HEMOGLOBIN A1C
Hgb A1c MFr Bld: 8.1 % — ABNORMAL HIGH (ref 4.8–5.6)
Mean Plasma Glucose: 185.77 mg/dL

## 2020-12-14 LAB — GLUCOSE, CAPILLARY
Glucose-Capillary: 265 mg/dL — ABNORMAL HIGH (ref 70–99)
Glucose-Capillary: 174 mg/dL — ABNORMAL HIGH (ref 70–99)
Glucose-Capillary: 180 mg/dL — ABNORMAL HIGH (ref 70–99)

## 2020-12-14 LAB — BRAIN NATRIURETIC PEPTIDE: B Natriuretic Peptide: 67.6 pg/mL (ref 0.0–100.0)

## 2020-12-14 LAB — SARS CORONAVIRUS 2 BY RT PCR (HOSPITAL ORDER, PERFORMED IN ~~LOC~~ HOSPITAL LAB): SARS Coronavirus 2: NEGATIVE

## 2020-12-14 SURGERY — LEFT HEART CATH AND CORONARY ANGIOGRAPHY
Anesthesia: LOCAL

## 2020-12-14 MED ORDER — NITROGLYCERIN 1 MG/10 ML FOR IR/CATH LAB
INTRA_ARTERIAL | Status: AC
  Filled 2020-12-14: qty 10

## 2020-12-14 MED ORDER — VERAPAMIL HCL 2.5 MG/ML IV SOLN
INTRAVENOUS | Status: AC
  Filled 2020-12-14: qty 2

## 2020-12-14 MED ORDER — SODIUM CHLORIDE 0.9% FLUSH
3.0000 mL | Freq: Two times a day (BID) | INTRAVENOUS | Status: DC
  Administered 2020-12-14: 3 mL via INTRAVENOUS

## 2020-12-14 MED ORDER — SODIUM CHLORIDE 0.9% FLUSH: 3.0000 mL | Freq: Two times a day (BID) | INTRAVENOUS | Status: DC

## 2020-12-14 MED ORDER — MIDAZOLAM HCL 2 MG/2ML IJ SOLN
INTRAMUSCULAR | Status: AC
  Filled 2020-12-14: qty 2

## 2020-12-14 MED ORDER — LIDOCAINE HCL (PF) 1 % IJ SOLN
INTRAMUSCULAR | Status: DC | PRN
  Administered 2020-12-14: 2 mL

## 2020-12-14 MED ORDER — METOPROLOL TARTRATE 12.5 MG HALF TABLET
12.5000 mg | ORAL_TABLET | Freq: Two times a day (BID) | ORAL | Status: DC
  Administered 2020-12-14 (×2): 12.5 mg via ORAL
  Filled 2020-12-14 (×3): qty 1

## 2020-12-14 MED ORDER — TIROFIBAN HCL IN NACL 5-0.9 MG/100ML-% IV SOLN
INTRAVENOUS | Status: AC
  Filled 2020-12-14: qty 100

## 2020-12-14 MED ORDER — SODIUM CHLORIDE 0.9 % IV SOLN: 250.0000 mL | INTRAVENOUS | Status: DC | PRN

## 2020-12-14 MED ORDER — FENTANYL CITRATE (PF) 100 MCG/2ML IJ SOLN
INTRAMUSCULAR | Status: DC | PRN
  Administered 2020-12-14 (×3): 25 ug via INTRAVENOUS

## 2020-12-14 MED ORDER — ONDANSETRON HCL 4 MG/2ML IJ SOLN: 4.0000 mg | Freq: Four times a day (QID) | INTRAMUSCULAR | Status: DC | PRN

## 2020-12-14 MED ORDER — IOHEXOL 350 MG/ML SOLN
INTRAVENOUS | Status: DC | PRN
  Administered 2020-12-14: 140 mL

## 2020-12-14 MED ORDER — SODIUM CHLORIDE 0.9 % IV SOLN: INTRAVENOUS | Status: AC

## 2020-12-14 MED ORDER — SODIUM CHLORIDE 0.9 % WEIGHT BASED INFUSION: 1.0000 mL/kg/h | INTRAVENOUS | Status: DC

## 2020-12-14 MED ORDER — TICAGRELOR 90 MG PO TABS
ORAL_TABLET | ORAL | Status: DC | PRN
  Administered 2020-12-14: 180 mg via ORAL

## 2020-12-14 MED ORDER — HEPARIN (PORCINE) 25000 UT/250ML-% IV SOLN
1300.0000 [IU]/h | INTRAVENOUS | Status: DC
  Administered 2020-12-14: 1300 [IU]/h via INTRAVENOUS
  Filled 2020-12-14: qty 250

## 2020-12-14 MED ORDER — ATORVASTATIN CALCIUM 80 MG PO TABS
80.0000 mg | ORAL_TABLET | Freq: Every day | ORAL | Status: DC
  Administered 2020-12-14 – 2020-12-15 (×2): 80 mg via ORAL
  Filled 2020-12-14 (×2): qty 1

## 2020-12-14 MED ORDER — NITROGLYCERIN 1 MG/10 ML FOR IR/CATH LAB
INTRA_ARTERIAL | Status: DC | PRN
  Administered 2020-12-14: 200 ug via INTRA_ARTERIAL
  Administered 2020-12-14: 200 ug

## 2020-12-14 MED ORDER — ASPIRIN 81 MG PO CHEW
324.0000 mg | CHEWABLE_TABLET | Freq: Once | ORAL | Status: AC
  Administered 2020-12-14: 324 mg via ORAL
  Filled 2020-12-14: qty 4

## 2020-12-14 MED ORDER — OMEGA-3-ACID ETHYL ESTERS 1 G PO CAPS
2.0000 g | ORAL_CAPSULE | Freq: Two times a day (BID) | ORAL | Status: DC
  Administered 2020-12-14 – 2020-12-15 (×2): 2 g via ORAL
  Filled 2020-12-14 (×3): qty 2

## 2020-12-14 MED ORDER — VERAPAMIL HCL 2.5 MG/ML IV SOLN
INTRAVENOUS | Status: DC | PRN
  Administered 2020-12-14: 10 mL via INTRA_ARTERIAL

## 2020-12-14 MED ORDER — ACETAMINOPHEN 325 MG PO TABS
650.0000 mg | ORAL_TABLET | ORAL | Status: DC | PRN
  Administered 2020-12-14: 650 mg via ORAL
  Filled 2020-12-14: qty 2

## 2020-12-14 MED ORDER — ACETAMINOPHEN 325 MG PO TABS: 650.0000 mg | ORAL_TABLET | ORAL | Status: DC | PRN

## 2020-12-14 MED ORDER — SODIUM CHLORIDE 0.9% FLUSH: 3.0000 mL | INTRAVENOUS | Status: DC | PRN

## 2020-12-14 MED ORDER — HEPARIN (PORCINE) IN NACL 1000-0.9 UT/500ML-% IV SOLN
INTRAVENOUS | Status: DC | PRN
  Administered 2020-12-14 (×2): 500 mL

## 2020-12-14 MED ORDER — TIROFIBAN HCL IN NACL 5-0.9 MG/100ML-% IV SOLN
INTRAVENOUS | Status: DC | PRN
  Administered 2020-12-14: 0.15 ug/kg/min via INTRAVENOUS

## 2020-12-14 MED ORDER — HYDRALAZINE HCL 20 MG/ML IJ SOLN: 10.0000 mg | INTRAMUSCULAR | Status: AC | PRN

## 2020-12-14 MED ORDER — NITROGLYCERIN 0.4 MG SL SUBL
0.4000 mg | SUBLINGUAL_TABLET | SUBLINGUAL | Status: DC | PRN
  Administered 2020-12-14: 0.4 mg via SUBLINGUAL
  Filled 2020-12-14: qty 1

## 2020-12-14 MED ORDER — TIROFIBAN (AGGRASTAT) BOLUS VIA INFUSION
INTRAVENOUS | Status: DC | PRN
  Administered 2020-12-14: 2665 ug via INTRAVENOUS

## 2020-12-14 MED ORDER — ASPIRIN 81 MG PO CHEW
CHEWABLE_TABLET | ORAL | Status: AC
  Filled 2020-12-14: qty 1

## 2020-12-14 MED ORDER — HEPARIN (PORCINE) IN NACL 1000-0.9 UT/500ML-% IV SOLN
INTRAVENOUS | Status: AC
  Filled 2020-12-14: qty 500

## 2020-12-14 MED ORDER — TICAGRELOR 90 MG PO TABS
90.0000 mg | ORAL_TABLET | Freq: Two times a day (BID) | ORAL | Status: DC
  Administered 2020-12-14 – 2020-12-15 (×2): 90 mg via ORAL
  Filled 2020-12-14 (×2): qty 1

## 2020-12-14 MED ORDER — LISINOPRIL 10 MG PO TABS: 5.0000 mg | ORAL_TABLET | Freq: Every day | ORAL | Status: DC

## 2020-12-14 MED ORDER — ASPIRIN 81 MG PO CHEW
81.0000 mg | CHEWABLE_TABLET | Freq: Every day | ORAL | Status: DC
  Administered 2020-12-14 – 2020-12-15 (×2): 81 mg via ORAL
  Filled 2020-12-14: qty 1

## 2020-12-14 MED ORDER — FENTANYL CITRATE (PF) 100 MCG/2ML IJ SOLN
INTRAMUSCULAR | Status: AC
  Filled 2020-12-14: qty 2

## 2020-12-14 MED ORDER — MIDAZOLAM HCL 2 MG/2ML IJ SOLN
INTRAMUSCULAR | Status: DC | PRN
  Administered 2020-12-14 (×2): 1 mg via INTRAVENOUS
  Administered 2020-12-14: 2 mg via INTRAVENOUS

## 2020-12-14 MED ORDER — SODIUM CHLORIDE 0.9 % WEIGHT BASED INFUSION
3.0000 mL/kg/h | INTRAVENOUS | Status: DC
  Administered 2020-12-14: 3 mL/kg/h via INTRAVENOUS

## 2020-12-14 MED ORDER — VERAPAMIL HCL 2.5 MG/ML IV SOLN
INTRAVENOUS | Status: DC | PRN
Start: 2020-12-14 — End: 2020-12-14
  Administered 2020-12-14: 200 ug via INTRACORONARY
  Administered 2020-12-14: 600 ug via INTRA_ARTERIAL

## 2020-12-14 MED ORDER — HEPARIN BOLUS VIA INFUSION
4000.0000 [IU] | Freq: Once | INTRAVENOUS | Status: AC
  Administered 2020-12-14: 4000 [IU] via INTRAVENOUS
  Filled 2020-12-14: qty 4000

## 2020-12-14 MED ORDER — LIDOCAINE HCL (PF) 1 % IJ SOLN
INTRAMUSCULAR | Status: AC
  Filled 2020-12-14: qty 30

## 2020-12-14 MED ORDER — HEPARIN SODIUM (PORCINE) 1000 UNIT/ML IJ SOLN
INTRAMUSCULAR | Status: AC
  Filled 2020-12-14: qty 1

## 2020-12-14 MED ORDER — ASPIRIN EC 81 MG PO TBEC: 81.0000 mg | DELAYED_RELEASE_TABLET | Freq: Every day | ORAL | Status: DC

## 2020-12-14 MED ORDER — TICAGRELOR 90 MG PO TABS
ORAL_TABLET | ORAL | Status: AC
  Filled 2020-12-14: qty 2

## 2020-12-14 MED ORDER — LISINOPRIL 5 MG PO TABS
5.0000 mg | ORAL_TABLET | Freq: Every day | ORAL | Status: DC
  Administered 2020-12-15: 5 mg via ORAL
  Filled 2020-12-14: qty 1

## 2020-12-14 MED ORDER — LABETALOL HCL 5 MG/ML IV SOLN: 10.0000 mg | INTRAVENOUS | Status: AC | PRN

## 2020-12-14 MED ORDER — NITROGLYCERIN IN D5W 200-5 MCG/ML-% IV SOLN: 0.0000 ug/min | INTRAVENOUS | Status: DC

## 2020-12-14 MED ORDER — INSULIN ASPART 100 UNIT/ML ~~LOC~~ SOLN
0.0000 [IU] | Freq: Three times a day (TID) | SUBCUTANEOUS | Status: DC
  Administered 2020-12-15: 2 [IU] via SUBCUTANEOUS

## 2020-12-14 MED ORDER — INSULIN ASPART 100 UNIT/ML ~~LOC~~ SOLN: 0.0000 [IU] | Freq: Every day | SUBCUTANEOUS | Status: DC

## 2020-12-14 MED ORDER — HEPARIN SODIUM (PORCINE) 1000 UNIT/ML IJ SOLN
INTRAMUSCULAR | Status: DC | PRN
  Administered 2020-12-14: 5000 [IU] via INTRAVENOUS
  Administered 2020-12-14: 7000 [IU] via INTRAVENOUS

## 2020-12-14 SURGICAL SUPPLY — 21 items
BALLN SAPPHIRE 2.5X15 (BALLOONS) ×2
BALLN SAPPHIRE ~~LOC~~ 4.0X12 (BALLOONS) ×2 IMPLANT
BALLOON SAPPHIRE 2.5X15 (BALLOONS) ×1 IMPLANT
CATH 5FR JL3.5 JR4 ANG PIG MP (CATHETERS) ×2 IMPLANT
CATH INFINITI 5FR JL4 (CATHETERS) ×2 IMPLANT
CATH LAUNCHER 6FR EBU3.5 (CATHETERS) ×2 IMPLANT
CATH OPTICROSS HD (CATHETERS) ×2 IMPLANT
DEVICE RAD COMP TR BAND LRG (VASCULAR PRODUCTS) ×2 IMPLANT
GLIDESHEATH SLEND SS 6F .021 (SHEATH) ×2 IMPLANT
GUIDEWIRE INQWIRE 1.5J.035X260 (WIRE) ×1 IMPLANT
INQWIRE 1.5J .035X260CM (WIRE) ×2
KIT ENCORE 26 ADVANTAGE (KITS) ×2 IMPLANT
KIT HEART LEFT (KITS) ×2 IMPLANT
KIT HEMO VALVE WATCHDOG (MISCELLANEOUS) ×2 IMPLANT
PACK CARDIAC CATHETERIZATION (CUSTOM PROCEDURE TRAY) ×2 IMPLANT
SLED PULL BACK IVUS (MISCELLANEOUS) ×2 IMPLANT
STENT RESOLUTE ONYX 3.5X30 (Permanent Stent) ×2 IMPLANT
TRANSDUCER W/STOPCOCK (MISCELLANEOUS) ×2 IMPLANT
TUBING CIL FLEX 10 FLL-RA (TUBING) ×2 IMPLANT
WIRE ASAHI PROWATER 180CM (WIRE) ×2 IMPLANT
WIRE HI TORQ BMW 190CM (WIRE) ×2 IMPLANT

## 2020-12-14 NOTE — ED Provider Notes (Signed)
[facility] EMERGENCY DEPARTMENT Provider Note   CSN: [id number] Arrival date & time: 12/13/20  1815     History Chief Complaint  Patient presents with  . Chest Pain    James Francis is a 50 y.o. male w PMHx HTN, HLD,T2DM, presenting to the ED with complaint of chest pain.  Patient states symptoms began on Saturday.  Symptoms occur with exertion such as walking to the neighborhood.  He has a burning central chest pain that radiates to both arms.  His pain improves after few minutes of rest.  His symptoms have been becoming more frequent over the last few days, occurring with exertion and improved with rest.  He denies associated shortness of breath, nausea, diaphoresis, abdominal pain, lower extremity edema.  No known cardiac history.  He had echocardiogram done a few years ago by Dr. Elberta Fortis for a syncopal episode however it was normal per review of the medical record.  The history is provided by the patient.       Past Medical History:  Diagnosis Date  . Colitis    Indeterminate cause  . Hearing loss    High frequency bilateral  . History of broken nose    Multiple- has deviated septum  . History of colon polyps   . Hyperlipidemia   . Ruptured lumbar disc    L4-L5 as a child    Patient Active Problem List   Diagnosis Date Noted  . NSTEMI (non-ST elevated myocardial infarction) (HCC) 12/14/2020  . Bulging lumbar disc 08/12/2014  . Left knee pain 02/02/2014  . Diabetes mellitus without complication (HCC) 10/13/2013  . Hyperlipidemia 08/04/2013  . Obesity 08/04/2013    Past Surgical History:  Procedure Laterality Date  . NO PAST SURGERIES         Family History  Problem Relation Age of Onset  . Cancer Mother        unknown type  . Kidney Stones Mother   . Heart disease Father        MI age 49, triple Bypass/ death age 78  . Cancer Maternal Grandmother        Ovarian  . Heart disease Paternal Grandfather        MI  . Diabetes Paternal  Grandfather   . Crohn's disease Paternal Aunt   . Colon cancer Neg Hx   . Stomach cancer Neg Hx   . Rectal cancer Neg Hx   . Esophageal cancer Neg Hx   . Liver cancer Neg Hx     Social History   Tobacco Use  . Smoking status: Never Smoker  . Smokeless tobacco: Never Used  Substance Use Topics  . Alcohol use: Yes    Alcohol/week: 0.0 standard drinks    Comment: occasionally   . Drug use: No    Home Medications Prior to Admission medications   Medication Sig Start Date End Date Taking? Authorizing Provider  atorvastatin (LIPITOR) 40 MG tablet TAKE 1 TABLET BY MOUTH  DAILY 05/31/20  Yes [place], Velna Hatchet, MD  Glucose Blood (BLOOD GLUCOSE TEST STRIPS) STRP Please dispense based on patient and insurance preference. Use as directed to monitor FSBS 1x daily. Dx: E11.9. 11/18/18  Yes [place], Velna Hatchet, MD  lisinopril (ZESTRIL) 5 MG tablet TAKE 1 TABLET BY MOUTH  DAILY 05/31/20  Yes [place], Velna Hatchet, MD  metFORMIN (GLUCOPHAGE) 1000 MG tablet Take 0.5 tablets (500 mg total) by mouth 2 (two) times daily with a meal. 11/01/20  Yes [place], Velna Hatchet, MD  terbinafine (LAMISIL) 250 MG tablet Take 1 tablet (250 mg total) by mouth daily. For 6 weeks 10/04/20   Salley Scarlet, MD    Allergies    Patient has no known allergies.  Review of Systems   Review of Systems  Cardiovascular: Positive for chest pain.  All other systems reviewed and are negative.   Physical Exam Updated Vital Signs BP 117/76   Pulse 94   Temp 98.4 F (36.9 C) (Oral)   Resp 13   Ht 6' (1.829 m)   Wt 106.6 kg   SpO2 100%   BMI 31.87 kg/m   Physical Exam Vitals and nursing note reviewed.  Constitutional:      General: He is not in acute distress.    Appearance: He is well-developed and well-nourished. He is not ill-appearing.  HENT:     Head: Normocephalic and atraumatic.  Eyes:     Conjunctiva/sclera: Conjunctivae normal.  Cardiovascular:     Rate and Rhythm: Normal rate and regular rhythm.      Pulses: Normal pulses.     Heart sounds: Normal heart sounds.  Pulmonary:     Effort: Pulmonary effort is normal. No respiratory distress.     Breath sounds: Normal breath sounds.  Abdominal:     General: Bowel sounds are normal.     Palpations: Abdomen is soft.     Tenderness: There is no abdominal tenderness.  Musculoskeletal:     Cervical back: Normal range of motion.     Right lower leg: No edema.     Left lower leg: No edema.  Skin:    General: Skin is warm.  Neurological:     Mental Status: He is alert.  Psychiatric:        Mood and Affect: Mood and affect normal.        Behavior: Behavior normal.     ED Results / Procedures / Treatments   Labs (all labs ordered are listed, but only abnormal results are displayed) Labs Reviewed  BASIC METABOLIC PANEL - Abnormal; Notable for the following components:      Result Value   Glucose, Bld 183 (*)    All other components within normal limits  TROPONIN I (HIGH SENSITIVITY) - Abnormal; Notable for the following components:   Troponin I (High Sensitivity) 110 (*)    All other components within normal limits  TROPONIN I (HIGH SENSITIVITY) - Abnormal; Notable for the following components:   Troponin I (High Sensitivity) 103 (*)    All other components within normal limits  SARS CORONAVIRUS 2 BY RT PCR (HOSPITAL ORDER, PERFORMED IN [facility])  CBC  HIV ANTIBODY (ROUTINE TESTING W REFLEX)  HEMOGLOBIN A1C  MAGNESIUM  TSH  COMPREHENSIVE METABOLIC PANEL  HEMOGLOBIN A1C  BRAIN NATRIURETIC PEPTIDE  BASIC METABOLIC PANEL  LIPID PANEL    EKG EKG Interpretation  Date/Time:  Monday December 13 2020 18:22:21 EST Ventricular Rate:  65 PR Interval:  188 QRS Duration: 78 QT Interval:  384 QTC Calculation: 399 R Axis:   32 Text Interpretation: Normal sinus rhythm Normal ECG No significant change since last tracing Confirmed by Marily Memos [phone]) on 12/13/2020 8:30:30 PM   Radiology DG Chest 2 View  Result  Date: 12/13/2020 CLINICAL DATA:  Chest pain for several days EXAM: CHEST - 2 VIEW COMPARISON:  None. FINDINGS: The heart size and mediastinal contours are within normal limits. Both lungs are clear. The visualized skeletal structures are unremarkable. IMPRESSION: No active cardiopulmonary disease. Electronically Signed  By: Alcide Clever M.D.   On: 12/13/2020 19:03    Procedures Procedures   Medications Ordered in ED Medications  atorvastatin (LIPITOR) tablet 80 mg (has no administration in time range)  lisinopril (ZESTRIL) tablet 5 mg (has no administration in time range)  aspirin EC tablet 81 mg (has no administration in time range)  nitroGLYCERIN (NITROSTAT) SL tablet 0.4 mg (has no administration in time range)  acetaminophen (TYLENOL) tablet 650 mg (has no administration in time range)  ondansetron (ZOFRAN) injection 4 mg (has no administration in time range)  nitroGLYCERIN 50 mg in dextrose 5 % 250 mL (0.2 mg/mL) infusion (has no administration in time range)  metoprolol tartrate (LOPRESSOR) tablet 12.5 mg (has no administration in time range)  insulin aspart (novoLOG) injection 0-9 Units (has no administration in time range)  insulin aspart (novoLOG) injection 0-5 Units (has no administration in time range)  aspirin chewable tablet 324 mg (324 mg Oral Given 12/14/20 0109)    ED Course  I have reviewed the triage vital signs and the nursing notes.  Pertinent labs & imaging results that were available during my care of the patient were reviewed by me and considered in my medical decision making (see chart for details).  Clinical Course as of 12/14/20 0225  Tue Dec 14, 2020  0040 Discussed with cardiology.  Will evaluate patient at bedside for recommendations. [JR]    Clinical Course User Index [JR] Chritopher Coster, [place] N, PA-C   MDM Rules/Calculators/A&P                          Patient presenting with symptoms concerning for angina.  He is having chest pain with exertion, improved  with rest, becoming more frequent since Saturday.  History of hypertension, hyperlipidemia and type 2 diabetes.  No known history of CAD or previous stress test.  He was seen by Dr. Elberta Fortis a few years ago for echo for evaluation of syncopal episode.   On examination he is chest pain-free.  He is hypertensive 140s over 90s.  His initial troponin is elevated to 110 and repeat is 103.  Chest x-ray is clear.  EKG without acute findings of ischemia. He is given full dose of aspirin.  Recommend admission for NSTEMI. cardiology consulted, will evaluate patient at bedside for recommendations and disposition.  2:25 AM Cardiology to admit for NSTEMI.  Final Clinical Impression(s) / ED Diagnoses Final diagnoses:  NSTEMI (non-ST elevated myocardial infarction) [facility])    Rx / DC Orders ED Discharge Orders    None       Elaysha Bevard, [place] N, PA-C 12/14/20 0225    Sabas Sous, MD 12/14/20 [phone]

## 2020-12-14 NOTE — Progress Notes (Signed)
ANTICOAGULATION CONSULT NOTE - Initial Consult  Pharmacy Consult for Heparin Indication: chest pain/ACS  No Known Allergies  Patient Measurements: Height: 6' (182.9 cm) Weight: 106.6 kg (235 lb) IBW/kg (Calculated) : 77.6 Heparin Dosing Weight: 100 kg  Vital Signs: Temp: 98.4 F (36.9 C) (02/01 0145) Temp Source: Oral (02/01 0145) BP: 117/76 (02/01 0145) Pulse Rate: 94 (02/01 0145)  Labs: Recent Labs    12/13/20 1826 12/13/20 2127  HGB 15.7  --   HCT 46.2  --   PLT 271  --   CREATININE 0.99  --   TROPONINIHS 110* 103*    Estimated Creatinine Clearance: 113.9 mL/min (by C-G formula based on SCr of 0.99 mg/dL).   Medical History: Past Medical History:  Diagnosis Date  . Colitis    Indeterminate cause  . Hearing loss    High frequency bilateral  . History of broken nose    Multiple- has deviated septum  . History of colon polyps   . Hyperlipidemia   . Ruptured lumbar disc    L4-L5 as a child    Medications:  No current facility-administered medications on file prior to encounter.   Current Outpatient Medications on File Prior to Encounter  Medication Sig Dispense Refill  . atorvastatin (LIPITOR) 40 MG tablet TAKE 1 TABLET BY MOUTH  DAILY 90 tablet 3  . Glucose Blood (BLOOD GLUCOSE TEST STRIPS) STRP Please dispense based on patient and insurance preference. Use as directed to monitor FSBS 1x daily. Dx: E11.9. 100 each 1  . lisinopril (ZESTRIL) 5 MG tablet TAKE 1 TABLET BY MOUTH  DAILY 90 tablet 3  . metFORMIN (GLUCOPHAGE) 1000 MG tablet Take 0.5 tablets (500 mg total) by mouth 2 (two) times daily with a meal. 90 tablet 3  . terbinafine (LAMISIL) 250 MG tablet Take 1 tablet (250 mg total) by mouth daily. For 6 weeks 45 tablet 0     Assessment: 50 y.o. male with chest pain for heparin  Goal of Therapy:  Heparin level 0.3-0.7 units/ml Monitor platelets by anticoagulation protocol: Yes   Plan:  Heparin 4000 units IV bolus, then start heparin 1300  units/hr Check heparin level in 6 hours.   Eddie Candle 12/14/2020,2:23 AM

## 2020-12-14 NOTE — Progress Notes (Signed)
  Echocardiogram 2D Echocardiogram has been performed.  James Francis 12/14/2020, 3:07 PM

## 2020-12-14 NOTE — Interval H&P Note (Signed)
Cath Lab Visit (complete for each Cath Lab visit)  Clinical Evaluation Leading to the Procedure:   ACS: Yes.    Non-ACS:    Anginal Classification: CCS IV  Anti-ischemic medical therapy: Minimal Therapy (1 class of medications)  Non-Invasive Test Results: No non-invasive testing performed  Prior CABG: No previous CABG      History and Physical Interval Note:  12/14/2020 9:10 AM  James Francis  has presented today for surgery, with the diagnosis of unstable angina.  The various methods of treatment have been discussed with the patient and family. After consideration of risks, benefits and other options for treatment, the patient has consented to  Procedure(s): LEFT HEART CATH AND CORONARY ANGIOGRAPHY (N/A) as a surgical intervention.  The patient's history has been reviewed, patient examined, no change in status, stable for surgery.  I have reviewed the patient's chart and labs.  Questions were answered to the patient's satisfaction.     Lance Muss

## 2020-12-14 NOTE — Care Management (Addendum)
1503 12-14-20 Benefits check submitted for Brilinta. Case Manager will follow for cost. Graves-Bigelow, Lamar Laundry, RN,BSN Case Manager   Brilinta  tier 2 No Deductible for 90 day Supply co pay is 30% of RX Cost

## 2020-12-14 NOTE — Progress Notes (Addendum)
Progress Note  Patient Name: James Francis Date of Encounter: 12/14/2020  [facility] Cardiologist: No primary care provider on file.  Subjective   Still with mild chest pressure this morning. Details explained regarding cath.   Inpatient Medications    Scheduled Meds: . [START ON 12/15/2020] aspirin EC  81 mg Oral Daily  . atorvastatin  80 mg Oral Daily  . insulin aspart  0-5 Units Subcutaneous QHS  . insulin aspart  0-9 Units Subcutaneous TID WC  . lisinopril  5 mg Oral Daily  . metoprolol tartrate  12.5 mg Oral BID   Continuous Infusions: . heparin 1,300 Units/hr (12/14/20 0347)  . nitroGLYCERIN     PRN Meds: acetaminophen, nitroGLYCERIN, ondansetron (ZOFRAN) IV   Vital Signs    Vitals:   12/14/20 0515 12/14/20 0600 12/14/20 0645 12/14/20 0700  BP: 138/88 130/81 (!) 134/92 133/90  Pulse:      Resp: 16 17 19 11   Temp:      TempSrc:      SpO2: 98% 97% 97% 98%  Weight:      Height:       No intake or output data in the 24 hours ending 12/14/20 0703 Last 3 Weights 12/13/2020 10/04/2020 04/27/2020  Weight (lbs) 235 lb 224 lb 222 lb  Weight (kg) 106.595 kg 101.606 kg 100.699 kg      Telemetry    SR - Personally Reviewed  ECG    SR with nonspecific changes - Personally Reviewed  Physical Exam   GEN: No acute distress.   Neck: No JVD Cardiac: RRR, no murmurs, rubs, or gallops.  Respiratory: Clear to auscultation bilaterally. GI: Soft, nontender, non-distended  MS: No edema; No deformity. Neuro:  Nonfocal  Psych: Normal affect   Labs    High Sensitivity Troponin:   Recent Labs  Lab 12/13/20 1826 12/13/20 2127  TROPONINIHS 110* 103*      Chemistry Recent Labs  Lab 12/13/20 1826 12/14/20 0317  NA 138 137  K 4.3 3.9  CL 101 101  CO2 26 23  GLUCOSE 183* 201*  BUN 13 13  CREATININE 0.99 1.02  CALCIUM 9.7 9.1  PROT  --  6.4*  ALBUMIN  --  4.0  AST  --  22  ALT  --  24  ALKPHOS  --  49  BILITOT  --  1.1  GFRNONAA >60 >60  ANIONGAP 11  13     Hematology Recent Labs  Lab 12/13/20 1826  WBC 9.7  RBC 5.42  HGB 15.7  HCT 46.2  MCV 85.2  MCH 29.0  MCHC 34.0  RDW 12.9  PLT 271    BNP Recent Labs  Lab 12/14/20 0343  BNP 67.6     DDimer No results for input(s): DDIMER in the last 168 hours.   Radiology    DG Chest 2 View  Result Date: 12/13/2020 CLINICAL DATA:  Chest pain for several days EXAM: CHEST - 2 VIEW COMPARISON:  None. FINDINGS: The heart size and mediastinal contours are within normal limits. Both lungs are clear. The visualized skeletal structures are unremarkable. IMPRESSION: No active cardiopulmonary disease. Electronically Signed   By: 12/15/2020 M.D.   On: 12/13/2020 19:03    Cardiac Studies   Echo: pending  Patient Profile     50 y.o. male with PMH of HTN, HLD, and DM who presented with chest pain and found to have NSTEMI.  Assessment & Plan    1. NSTEMI: hsTn 110 on admission. EKG showed  SR with nonspecific changes. Currently on IV heparin.  -- plan for cardiac cath today -- echo pending -- The patient understands that risks included but are not limited to stroke (1 in 1000), death (1 in 1000), kidney failure [usually temporary] (1 in 500), bleeding (1 in 200), allergic reaction [possibly serious] (1 in 200).   2. HTN: started on metoprolol 12.5mg  BID on admission. Will home lisinopril with plans for cardiac cath today  3. Mixed HLD: LDL 66, Trig 345 -- home statin was increased from atorvastatin 40mg  to 80mg  daily on admission -- add Lovaza 2g BID  4. DM: on metformin PTA which is held with plans for cath -- Hgb A1c 8.1 -- SSI -- will need to consider SGLP1 prior to discharge pending cath results  For questions or updates, please contact [facility] Please consult [email or web address] for contact info under        Signed, , NP  12/14/2020, 7:03 AM    Patient seen, examined. Available data reviewed. Agree with findings, assessment, and plan as outlined by  Laverda Page, NP. The patient is independently interviewed and examined. His wife is at the bedside. He has very typical symptoms of crescendo angina, now with mildly elevated troponin, diagnostic of non-STEMI. Agree with plans as outlined above. I gave a detailed explanation of the cardiac catheterization/PCI procedure and reviewed all potential risks with the patient and his wife. They agree to proceed. He is posted for next case in the cath lab. Dr 02/11/2021 to perform.   Laverda Page, M.D. 12/14/2020 8:54 AM

## 2020-12-14 NOTE — ED Notes (Addendum)
Pt denies chest pain but reports pain bilaterally to upper extremities, post nitro pt denies headache, but brady noted to cardiac monitor 58bpm

## 2020-12-14 NOTE — H&P (View-Only) (Signed)
Progress Note  Patient Name: James Francis Date of Encounter: 12/14/2020  [facility] Cardiologist: No primary care provider on file.  Subjective   Still with mild chest pressure this morning. Details explained regarding cath.   Inpatient Medications    Scheduled Meds: . [START ON 12/15/2020] aspirin EC  81 mg Oral Daily  . atorvastatin  80 mg Oral Daily  . insulin aspart  0-5 Units Subcutaneous QHS  . insulin aspart  0-9 Units Subcutaneous TID WC  . lisinopril  5 mg Oral Daily  . metoprolol tartrate  12.5 mg Oral BID   Continuous Infusions: . heparin 1,300 Units/hr (12/14/20 0347)  . nitroGLYCERIN     PRN Meds: acetaminophen, nitroGLYCERIN, ondansetron (ZOFRAN) IV   Vital Signs    Vitals:   12/14/20 0515 12/14/20 0600 12/14/20 0645 12/14/20 0700  BP: 138/88 130/81 (!) 134/92 133/90  Pulse:      Resp: 16 17 19 11   Temp:      TempSrc:      SpO2: 98% 97% 97% 98%  Weight:      Height:       No intake or output data in the 24 hours ending 12/14/20 0703 Last 3 Weights 12/13/2020 10/04/2020 04/27/2020  Weight (lbs) 235 lb 224 lb 222 lb  Weight (kg) 106.595 kg 101.606 kg 100.699 kg      Telemetry    SR - Personally Reviewed  ECG    SR with nonspecific changes - Personally Reviewed  Physical Exam   GEN: No acute distress.   Neck: No JVD Cardiac: RRR, no murmurs, rubs, or gallops.  Respiratory: Clear to auscultation bilaterally. GI: Soft, nontender, non-distended  MS: No edema; No deformity. Neuro:  Nonfocal  Psych: Normal affect   Labs    High Sensitivity Troponin:   Recent Labs  Lab 12/13/20 1826 12/13/20 2127  TROPONINIHS 110* 103*      Chemistry Recent Labs  Lab 12/13/20 1826 12/14/20 0317  NA 138 137  K 4.3 3.9  CL 101 101  CO2 26 23  GLUCOSE 183* 201*  BUN 13 13  CREATININE 0.99 1.02  CALCIUM 9.7 9.1  PROT  --  6.4*  ALBUMIN  --  4.0  AST  --  22  ALT  --  24  ALKPHOS  --  49  BILITOT  --  1.1  GFRNONAA >60 >60  ANIONGAP 11  13     Hematology Recent Labs  Lab 12/13/20 1826  WBC 9.7  RBC 5.42  HGB 15.7  HCT 46.2  MCV 85.2  MCH 29.0  MCHC 34.0  RDW 12.9  PLT 271    BNP Recent Labs  Lab 12/14/20 0343  BNP 67.6     DDimer No results for input(s): DDIMER in the last 168 hours.   Radiology    DG Chest 2 View  Result Date: 12/13/2020 CLINICAL DATA:  Chest pain for several days EXAM: CHEST - 2 VIEW COMPARISON:  None. FINDINGS: The heart size and mediastinal contours are within normal limits. Both lungs are clear. The visualized skeletal structures are unremarkable. IMPRESSION: No active cardiopulmonary disease. Electronically Signed   By: 12/15/2020 M.D.   On: 12/13/2020 19:03    Cardiac Studies   Echo: pending  Patient Profile     50 y.o. male with PMH of HTN, HLD, and DM who presented with chest pain and found to have NSTEMI.  Assessment & Plan    1. NSTEMI: hsTn 110 on admission. EKG showed  SR with nonspecific changes. Currently on IV heparin.  -- plan for cardiac cath today -- echo pending -- The patient understands that risks included but are not limited to stroke (1 in 1000), death (1 in 1000), kidney failure [usually temporary] (1 in 500), bleeding (1 in 200), allergic reaction [possibly serious] (1 in 200).   2. HTN: started on metoprolol 12.5mg BID on admission. Will home lisinopril with plans for cardiac cath today  3. Mixed HLD: LDL 66, Trig 345 -- home statin was increased from atorvastatin 40mg to 80mg daily on admission -- add Lovaza 2g BID  4. DM: on metformin PTA which is held with plans for cath -- Hgb A1c 8.1 -- SSI -- will need to consider SGLP1 prior to discharge pending cath results  For questions or updates, please contact [facility] Please consult [email or web address] for contact info under        Signed, Lindsay Roberts, NP  12/14/2020, 7:03 AM    Patient seen, examined. Available data reviewed. Agree with findings, assessment, and plan as outlined by  Lindsay Roberts, NP. The patient is independently interviewed and examined. His wife is at the bedside. He has very typical symptoms of crescendo angina, now with mildly elevated troponin, diagnostic of non-STEMI. Agree with plans as outlined above. I gave a detailed explanation of the cardiac catheterization/PCI procedure and reviewed all potential risks with the patient and his wife. They agree to proceed. He is posted for next case in the cath lab. Dr Varanasi to perform.   James Francis, M.D. 12/14/2020 8:54 AM   

## 2020-12-14 NOTE — Progress Notes (Signed)
Arlys John, tech to bedside, continues holding manual pressure above the tr band (RN held pressure for approximately 7-8 minutes prior to tech holding), pt tolerating well, safety maintained

## 2020-12-14 NOTE — H&P (Signed)
Cardiology History & Physical    Patient ID: James Francis MRN: [id number], DOB/AGE: 05-29-1971   Admit date: 12/13/2020  Primary Physician: Salley Scarlet, MD Primary Cardiologist: No primary care provider on file.  Patient Profile    50 year old male with a history of hypertension, hyperlipidemia, diabetes type 2 not on insulin who presents with chest pain.  History of Present Illness    This is a 50 year old male with a history of hypertension, hyperlipidemia, type 2 diabetes who reports that since Friday of last week he has been having exertional chest pain.  He reports pain that is in the middle of his chest, worsened by exertion and relieved by rest.  He also has associated pain that radiates down his bilateral arms and is sharp and fatigue-like feeling that is also relieved by rest.  Denies significant orthopnea, PND, but he has noticed some mild shortness of breath with exertion.  Pain is not pleuritic.  It does not have a positional nature.  No recent sick contacts or respiratory infection symptoms.  No diarrhea, nausea, vomiting.  No significant PND.  He has no prior cardiovascular history but has a strong family history with early MIs in both his father and grandfather.   Past Medical History   Past Medical History:  Diagnosis Date  . Colitis    Indeterminate cause  . Hearing loss    High frequency bilateral  . History of broken nose    Multiple- has deviated septum  . History of colon polyps   . Hyperlipidemia   . Ruptured lumbar disc    L4-L5 as a child    Past Surgical History:  Procedure Laterality Date  . NO PAST SURGERIES       Allergies No Known Allergies  Home Medications    Prior to Admission medications   Medication Sig Start Date End Date Taking? Authorizing Provider  atorvastatin (LIPITOR) 40 MG tablet TAKE 1 TABLET BY MOUTH  DAILY 05/31/20  Yes [place], Velna Hatchet, MD  Glucose Blood (BLOOD GLUCOSE TEST STRIPS) STRP Please dispense based on patient  and insurance preference. Use as directed to monitor FSBS 1x daily. Dx: E11.9. 11/18/18  Yes [place], Velna Hatchet, MD  lisinopril (ZESTRIL) 5 MG tablet TAKE 1 TABLET BY MOUTH  DAILY 05/31/20  Yes [place], Velna Hatchet, MD  metFORMIN (GLUCOPHAGE) 1000 MG tablet Take 0.5 tablets (500 mg total) by mouth 2 (two) times daily with a meal. 11/01/20  Yes [place], Velna Hatchet, MD  terbinafine (LAMISIL) 250 MG tablet Take 1 tablet (250 mg total) by mouth daily. For 6 weeks 10/04/20   Salley Scarlet, MD    Family History    Family History  Problem Relation Age of Onset  . Cancer Mother        unknown type  . Kidney Stones Mother   . Heart disease Father        MI age 51, triple Bypass/ death age 55  . Cancer Maternal Grandmother        Ovarian  . Heart disease Paternal Grandfather        MI  . Diabetes Paternal Grandfather   . Crohn's disease Paternal Aunt   . Colon cancer Neg Hx   . Stomach cancer Neg Hx   . Rectal cancer Neg Hx   . Esophageal cancer Neg Hx   . Liver cancer Neg Hx    He indicated that his mother is alive. He indicated that his father is deceased. He indicated that his  sister is alive. He indicated that his maternal grandmother is deceased. He indicated that his maternal grandfather is deceased. He indicated that his paternal grandmother is deceased. He indicated that his paternal grandfather is deceased. He indicated that his paternal aunt is alive. He indicated that the status of his neg hx is unknown.   Social History    Social History   Socioeconomic History  . Marital status: Married    Spouse name: Not on file  . Number of children: 2  . Years of education: Not on file  . Highest education level: Not on file  Occupational History  . Occupation: Art gallery manager  Tobacco Use  . Smoking status: Never Smoker  . Smokeless tobacco: Never Used  Substance and Sexual Activity  . Alcohol use: Yes    Alcohol/week: 0.0 standard drinks    Comment: occasionally   . Drug use: No  .  Sexual activity: Not Currently  Other Topics Concern  . Not on file  Social History Narrative  . Not on file   Social Determinants of Health   Financial Resource Strain: Not on file  Food Insecurity: Not on file  Transportation Needs: Not on file  Physical Activity: Not on file  Stress: Not on file  Social Connections: Not on file  Intimate Partner Violence: Not on file     Review of Systems    General:  No chills, fever, night sweats or weight changes.  Cardiovascular:  No chest pain, dyspnea on exertion, edema, orthopnea, palpitations, paroxysmal nocturnal dyspnea. Dermatological: No rash, lesions/masses Respiratory: No cough, dyspnea Urologic: No hematuria, dysuria Abdominal:   No nausea, vomiting, diarrhea, bright red blood per rectum, melena, or hematemesis Neurologic:  No visual changes, wkns, changes in mental status. All other systems reviewed and are otherwise negative except as noted above.  Physical Exam    BP 133/87   Pulse 65   Temp 98.4 F (36.9 C) (Oral)   Resp 17   Ht 6' (1.829 m)   Wt 106.6 kg   SpO2 96%   BMI 31.87 kg/m  General: Alert, NAD HEENT: Normal  Neck: No bruits or JVD. Lungs:  Resp regular and unlabored, CTA bilaterally. Heart: Regular rhythm, no s3, s4, or murmurs. Abdomen: Soft, non-tender, non-distended, BS +.  Extremities: Warm. No clubbing, cyanosis or edema. DP/PT/Radials 2+ and equal bilaterally. Psych: Normal affect. Neuro: Alert and oriented. No gross focal deficits. No abnormal movements.  Labs    High-sensitivity troponin I 110, 103  Lab Results  Component Value Date   WBC 9.7 12/13/2020   HGB 15.7 12/13/2020   HCT 46.2 12/13/2020   MCV 85.2 12/13/2020   PLT 271 12/13/2020    Recent Labs  Lab 12/13/20 1826  NA 138  K 4.3  CL 101  CO2 26  BUN 13  CREATININE 0.99  CALCIUM 9.7  GLUCOSE 183*   Lab Results  Component Value Date   CHOL 133 10/04/2020   HDL 41 10/04/2020   LDLCALC 66 10/04/2020   TRIG 193  (H) 10/04/2020   No results found for: [facility]   Radiology Studies    DG Chest 2 View  Result Date: 12/13/2020 CLINICAL DATA:  Chest pain for several days EXAM: CHEST - 2 VIEW COMPARISON:  None. FINDINGS: The heart size and mediastinal contours are within normal limits. Both lungs are clear. The visualized skeletal structures are unremarkable. IMPRESSION: No active cardiopulmonary disease. Electronically Signed   By: Alcide Clever M.D.   On: 12/13/2020 19:03  ECG & Cardiac Imaging    ECG personally reviewed, normal sinus rhythm normal axis some nonspecific inferior ST changes.  Assessment & Plan    50 year old male presenting with exertional chest pain with positive troponin and nonspecific ST changes by ECG.  Rules in for acute coronary syndrome although his enzyme pattern is fairly adynamic and I am wondering if he had a remote event earlier during the last weekend on Thursday or Friday.  Despite that ECG does not show any Q waves.  Chest pain does have some relatively atypical features including radiation to his arms which is almost constant however no significant pleuritic component and no clear positional nature.  Favor NSTEMI as leading differential diagnosis with multiple underlying risk factors..  Problem list Chest pain Troponin elevation Hypertension Hyperlipidemia Type 2 diabetes  Plan Chest pain Troponin elevation -Aspirin 325 and 81 mg daily indefinitely -High intensity statin -Heparin infusion ACS nomogram -Start metoprolol tartrate 12.5 mg twice a day and titrate as tolerated -Transthoracic echo in morning -N.p.o. for coronary angiogram -Nitroglycerin sublingual, if arm pain not relieved start nitroglycerin infusion  Hypertension -Start beta-blocker as above -Resume home lisinopril 5 mg daily increase as tolerated  Hyperlipidemia -High intensity statin as above  Type 2 diabetes -Hold home Metformin, prefer SGLT2 as outpatient, GLP-1 RA of coronary disease  confirmed -Sliding scale insulin as inpatient -Continue statin as above -Continue ACE I as above  Nutrition: N.p.o. for cath DVT ppx: Therapeutic anticoagulation GI ppx: None indicated Advanced Care Planning: Full code   Signed, Regino Schultze, MD 12/14/2020, 3:57 AM

## 2020-12-14 NOTE — Progress Notes (Signed)
Pt given [place] sandwich meal box and H2o to drink

## 2020-12-14 NOTE — ED Notes (Signed)
Patient denies pain and is resting comfortably.  

## 2020-12-15 ENCOUNTER — Other Ambulatory Visit (HOSPITAL_COMMUNITY): Payer: Self-pay | Admitting: Cardiology

## 2020-12-15 ENCOUNTER — Telehealth (HOSPITAL_COMMUNITY): Payer: Self-pay

## 2020-12-15 DIAGNOSIS — I214 Non-ST elevation (NSTEMI) myocardial infarction: Secondary | ICD-10-CM | POA: Diagnosis not present

## 2020-12-15 DIAGNOSIS — I255 Ischemic cardiomyopathy: Secondary | ICD-10-CM

## 2020-12-15 LAB — BASIC METABOLIC PANEL
Anion gap: 10 (ref 5–15)
BUN: 12 mg/dL (ref 6–20)
CO2: 25 mmol/L (ref 22–32)
Calcium: 8.9 mg/dL (ref 8.9–10.3)
Chloride: 103 mmol/L (ref 98–111)
Creatinine, Ser: 1.1 mg/dL (ref 0.61–1.24)
GFR, Estimated: >60 mL/min (ref >60)
Glucose, Bld: 178 mg/dL — ABNORMAL HIGH (ref 70–99)
Potassium: 4.1 mmol/L (ref 3.5–5.1)
Sodium: 138 mmol/L (ref 135–145)

## 2020-12-15 LAB — CBC
HCT: 42.8 % (ref 39.0–52.0)
Hemoglobin: 14.6 g/dL (ref 13.0–17.0)
MCH: 29 pg (ref 26.0–34.0)
MCHC: 34.1 g/dL (ref 30.0–36.0)
MCV: 84.9 fL (ref 80.0–100.0)
Platelets: 215 10*3/uL (ref 150–400)
RBC: 5.04 MIL/uL (ref 4.22–5.81)
RDW: 13 % (ref 11.5–15.5)
WBC: 10.6 10*3/uL — ABNORMAL HIGH (ref 4.0–10.5)
nRBC: 0 % (ref 0.0–0.2)

## 2020-12-15 LAB — GLUCOSE, CAPILLARY
Glucose-Capillary: 187 mg/dL — ABNORMAL HIGH (ref 70–99)
Glucose-Capillary: 162 mg/dL — ABNORMAL HIGH (ref 70–99)

## 2020-12-15 MED ORDER — ASPIRIN 81 MG PO CHEW: 81.0000 mg | CHEWABLE_TABLET | Freq: Every day | ORAL | 1 refills | Status: DC

## 2020-12-15 MED ORDER — TICAGRELOR 90 MG PO TABS: 90.0000 mg | ORAL_TABLET | Freq: Two times a day (BID) | ORAL | 2 refills | Status: DC

## 2020-12-15 MED ORDER — METOPROLOL SUCCINATE ER 25 MG PO TB24: 25.0000 mg | ORAL_TABLET | Freq: Every day | ORAL | 1 refills | Status: DC

## 2020-12-15 MED ORDER — ATORVASTATIN CALCIUM 80 MG PO TABS: 80.0000 mg | ORAL_TABLET | Freq: Every day | ORAL | 1 refills | Status: DC

## 2020-12-15 MED ORDER — ICOSAPENT ETHYL 1 G PO CAPS: 2.0000 g | ORAL_CAPSULE | Freq: Two times a day (BID) | ORAL | 1 refills | Status: DC

## 2020-12-15 MED ORDER — NITROGLYCERIN 0.4 MG SL SUBL: 0.4000 mg | SUBLINGUAL_TABLET | SUBLINGUAL | 2 refills | Status: DC | PRN

## 2020-12-15 MED ORDER — EMPAGLIFLOZIN 10 MG PO TABS: 10.0000 mg | ORAL_TABLET | Freq: Every day | ORAL | 2 refills | Status: DC

## 2020-12-15 MED ORDER — ICOSAPENT ETHYL 1 G PO CAPS: 2.0000 g | ORAL_CAPSULE | Freq: Two times a day (BID) | ORAL | 3 refills | Status: DC

## 2020-12-15 MED ORDER — STUDY - AEGIS II STUDY - PLACEBO OR CSL112 (PI-HILTY)
170.0000 mL | Freq: Once | INTRAVENOUS | Status: AC
  Administered 2020-12-15: 170 mL via INTRAVENOUS
  Filled 2020-12-15: qty 170

## 2020-12-15 MED ORDER — OMEGA-3-ACID ETHYL ESTERS 1 G PO CAPS: 2.0000 g | ORAL_CAPSULE | Freq: Two times a day (BID) | ORAL | 2 refills | Status: DC

## 2020-12-15 MED ORDER — METOPROLOL SUCCINATE ER 25 MG PO TB24
25.0000 mg | ORAL_TABLET | Freq: Every day | ORAL | Status: DC
  Administered 2020-12-15: 25 mg via ORAL
  Filled 2020-12-15: qty 1

## 2020-12-15 MED FILL — BRILINTA 90 MG TABLET: 90 | 30 days supply | Qty: 60 | Fill #0 | Status: TO

## 2020-12-15 MED FILL — ATORVASTATIN CALCIUM 80 MG: 80 | 90 days supply | Qty: 90 | Fill #0 | Status: TO

## 2020-12-15 MED FILL — METOPROLOL SUCCINATE ER 25: 25 | 90 days supply | Qty: 90 | Fill #0 | Status: TO

## 2020-12-15 MED FILL — NITROGLYCERIN 0.4 MG TAB SL: 0.4 | 7 days supply | Qty: 25 | Fill #0 | Status: TO

## 2020-12-15 MED FILL — JARDIANCE 10 MG TABLET: 10 | 14 days supply | Qty: 14 | Fill #0 | Status: TO

## 2020-12-15 MED FILL — ASPIRIN LOW DOSE 81 MG CHEW: 81 | 90 days supply | Qty: 90 | Fill #0 | Status: TO

## 2020-12-15 NOTE — Progress Notes (Signed)
CARDIAC REHAB PHASE I   PRE:  Rate/Rhythm: 63 SR    BP: sitting 115/75    SaO2: 94 RA  MODE:  Ambulation: 400 ft   POST:  Rate/Rhythm: 70 SR    BP: sitting 122/77     SaO2: 98 RA  Tolerated well, no c/o. VSS. Discussed MI, stent, restrictions, Brilinta, diet, exercise, NTG and CRPII. Pt and wife voiced understanding, receptive although quiet. Will refer to [place] CRPII.  [id number]  James Francis CES, ACSM 12/15/2020 10:05 AM

## 2020-12-15 NOTE — Progress Notes (Signed)
AEGIS-II research study:  Patient seen an examined prior to randomization and infusion.  Physical Exam: General appearance: alert Lungs: clear to auscultation bilaterally Heart: regular rate and rhythm, S1, S2 normal, no murmur, click, rub or gallop Abdomen: normal findings: bowel sounds normal Extremities: extremities normal, atraumatic, no cyanosis or edema Pulses: 2+ and symmetric Skin: Skin color, texture, turgor normal. No rashes or lesions Neurologic: Grossly normal  [place] Class:  Yes, Class I  The patient was given the opportunity to ask any further questions about the study that were not addressed by the research nurse coordinators - he has no further questions and wants to proceed at this time.  SignedLaverda Page, NP-C 12/15/2020, 9:38 AM

## 2020-12-15 NOTE — Discharge Instructions (Addendum)
Information about your medication: Brilinta (anti-platelet agent)  Generic Name (Brand): ticagrelor (Brilinta), twice daily medication  PURPOSE: You are taking this medication along with aspirin to lower your chance of having a heart attack, stroke, or blood clots in your heart stent. These can be fatal. Brilinta and aspirin help prevent platelets from sticking together and forming a clot that can block an artery or your stent.   Common SIDE EFFECTS you may experience include: bruising or bleeding more easily, shortness of breath  Do not stop taking BRILINTA without talking to the doctor who prescribes it for you. People who are treated with a stent and stop taking Brilinta too soon, have a higher risk of getting a blood clot in the stent, having a heart attack, or dying. If you stop Brilinta because of bleeding, or for other reasons, your risk of a heart attack or stroke may increase.   Avoid taking NSAID agents or anti-inflammatory medications such as ibuprofen, naproxen given increased bleed risk with plavix - can use acetaminophen (Tylenol) if needed for pain.  Tell all of your doctors and dentists that you are taking Brilinta. They should talk to the doctor who prescribed Brilinta for you before you have any surgery or invasive procedure.   Contact your health care provider if you experience: severe or uncontrollable bleeding, pink/red/brown urine, vomiting blood or vomit that looks like "coffee grounds", red or black stools (looks like tar), coughing up blood or blood clots ----------------------------------------------------------------------------------------------------------------------    Radial Site Care  This sheet gives you information about how to care for yourself after your procedure. Your health care provider may also give you more specific instructions. If you have problems or questions, contact your health care provider. What can I expect after the procedure? After the  procedure, it is common to have:  Bruising and tenderness at the catheter insertion area. Follow these instructions at home: Medicines  Take over-the-counter and prescription medicines only as told by your health care provider. Insertion site care  Follow instructions from your health care provider about how to take care of your insertion site. Make sure you: ? Wash your hands with soap and water before you change your bandage (dressing). If soap and water are not available, use hand sanitizer. ? Change your dressing as told by your health care provider. ? Leave stitches (sutures), skin glue, or adhesive strips in place. These skin closures may need to stay in place for 2 weeks or longer. If adhesive strip edges start to loosen and curl up, you may trim the loose edges. Do not remove adhesive strips completely unless your health care provider tells you to do that.  Check your insertion site every day for signs of infection. Check for: ? Redness, swelling, or pain. ? Fluid or blood. ? Pus or a bad smell. ? Warmth.  Do not take baths, swim, or use a hot tub until your health care provider approves.  You may shower 24-48 hours after the procedure, or as directed by your health care provider. ? Remove the dressing and gently wash the site with plain soap and water. ? Pat the area dry with a clean towel. ? Do not rub the site. That could cause bleeding.  Do not apply powder or lotion to the site. Activity  For 24 hours after the procedure, or as directed by your health care provider: ? Do not flex or bend the affected arm. ? Do not push or pull heavy objects with the affected arm. ?  Do not drive yourself home from the hospital or clinic. You may drive 24 hours after the procedure unless your health care provider tells you not to. ? Do not operate machinery or power tools.  Do not lift anything that is heavier than 10 lb (4.5 kg), or the limit that you are told, until your health care  provider says that it is safe.  Ask your health care provider when it is okay to: ? Return to work or school. ? Resume usual physical activities or sports. ? Resume sexual activity.   General instructions  If the catheter site starts to bleed, raise your arm and put firm pressure on the site. If the bleeding does not stop, get help right away. This is a medical emergency.  If you went home on the same day as your procedure, a responsible adult should be with you for the first 24 hours after you arrive home.  Keep all follow-up visits as told by your health care provider. This is important. Contact a health care provider if:  You have a fever.  You have redness, swelling, or yellow drainage around your insertion site. Get help right away if:  You have unusual pain at the radial site.  The catheter insertion area swells very fast.  The insertion area is bleeding, and the bleeding does not stop when you hold steady pressure on the area.  Your arm or hand becomes pale, cool, tingly, or numb. These symptoms may represent a serious problem that is an emergency. Do not wait to see if the symptoms will go away. Get medical help right away. Call your local emergency services (911 in the [place] Do not drive yourself to the hospital. Summary  After the procedure, it is common to have bruising and tenderness at the site.  Follow instructions from your health care provider about how to take care of your radial site wound. Check the wound every day for signs of infection.  Do not lift anything that is heavier than 10 lb (4.5 kg), or the limit that you are told, until your health care provider says that it is safe. This information is not intended to replace advice given to you by your health care provider. Make sure you discuss any questions you have with your health care provider. Document Revised: 12/05/2017 Document Reviewed: 12/05/2017 Elsevier Patient Education  2021 Elsevier  Inc.    Diabetes Mellitus and Nutrition, Adult When you have diabetes, or diabetes mellitus, it is very important to have healthy eating habits because your blood sugar (glucose) levels are greatly affected by what you eat and drink. Eating healthy foods in the right amounts, at about the same times every day, can help you:  Control your blood glucose.  Lower your risk of heart disease.  Improve your blood pressure.  Reach or maintain a healthy weight. What can affect my meal plan? Every person with diabetes is different, and each person has different needs for a meal plan. Your health care provider may recommend that you work with a dietitian to make a meal plan that is best for you. Your meal plan may vary depending on factors such as:  The calories you need.  The medicines you take.  Your weight.  Your blood glucose, blood pressure, and cholesterol levels.  Your activity level.  Other health conditions you have, such as heart or kidney disease. How do carbohydrates affect me? Carbohydrates, also called carbs, affect your blood glucose level more than any other type  of food. Eating carbs naturally raises the amount of glucose in your blood. Carb counting is a method for keeping track of how many carbs you eat. Counting carbs is important to keep your blood glucose at a healthy level, especially if you use insulin or take certain oral diabetes medicines. It is important to know how many carbs you can safely have in each meal. This is different for every person. Your dietitian can help you calculate how many carbs you should have at each meal and for each snack. How does alcohol affect me? Alcohol can cause a sudden decrease in blood glucose (hypoglycemia), especially if you use insulin or take certain oral diabetes medicines. Hypoglycemia can be a life-threatening condition. Symptoms of hypoglycemia, such as sleepiness, dizziness, and confusion, are similar to symptoms of having too  much alcohol.  Do not drink alcohol if: ? Your health care provider tells you not to drink. ? You are pregnant, may be pregnant, or are planning to become pregnant.  If you drink alcohol: ? Do not drink on an empty stomach. ? Limit how much you use to:  0-1 drink a day for women.  0-2 drinks a day for men. ? Be aware of how much alcohol is in your drink. In the [place] one drink equals one 12 oz bottle of beer (355 mL), one 5 oz glass of wine (148 mL), or one 1 oz glass of hard liquor (44 mL). ? Keep yourself hydrated with water, diet soda, or unsweetened iced tea.  Keep in mind that regular soda, juice, and other mixers may contain a lot of sugar and must be counted as carbs. What are tips for following this plan? Reading food labels  Start by checking the serving size on the "Nutrition Facts" label of packaged foods and drinks. The amount of calories, carbs, fats, and other nutrients listed on the label is based on one serving of the item. Many items contain more than one serving per package.  Check the total grams (g) of carbs in one serving. You can calculate the number of servings of carbs in one serving by dividing the total carbs by 15. For example, if a food has 30 g of total carbs per serving, it would be equal to 2 servings of carbs.  Check the number of grams (g) of saturated fats and trans fats in one serving. Choose foods that have a low amount or none of these fats.  Check the number of milligrams (mg) of salt (sodium) in one serving. Most people should limit total sodium intake to less than 2,300 mg per day.  Always check the nutrition information of foods labeled as "low-fat" or "nonfat." These foods may be higher in added sugar or refined carbs and should be avoided.  Talk to your dietitian to identify your daily goals for nutrients listed on the label. Shopping  Avoid buying canned, pre-made, or processed foods. These foods tend to be high in fat, sodium, and added  sugar.  Shop around the outside edge of the grocery store. This is where you will most often find fresh fruits and vegetables, bulk grains, fresh meats, and fresh dairy. Cooking  Use low-heat cooking methods, such as baking, instead of high-heat cooking methods like deep frying.  Cook using healthy oils, such as olive, canola, or sunflower oil.  Avoid cooking with butter, cream, or high-fat meats. Meal planning  Eat meals and snacks regularly, preferably at the same times every day. Avoid going long periods of  time without eating.  Eat foods that are high in fiber, such as fresh fruits, vegetables, beans, and whole grains. Talk with your dietitian about how many servings of carbs you can eat at each meal.  Eat 4-6 oz (112-168 g) of lean protein each day, such as lean meat, chicken, fish, eggs, or tofu. One ounce (oz) of lean protein is equal to: ? 1 oz (28 g) of meat, chicken, or fish. ? 1 egg. ?  cup (62 g) of tofu.  Eat some foods each day that contain healthy fats, such as avocado, nuts, seeds, and fish.   What foods should I eat? Fruits Berries. Apples. Oranges. Peaches. Apricots. Plums. Grapes. Mango. Papaya. Pomegranate. Kiwi. Cherries. Vegetables Lettuce. Spinach. Leafy greens, including kale, chard, collard greens, and mustard greens. Beets. Cauliflower. Cabbage. Broccoli. Carrots. Green beans. Tomatoes. Peppers. Onions. Cucumbers. Brussels sprouts. Grains Whole grains, such as whole-wheat or whole-grain bread, crackers, tortillas, cereal, and pasta. Unsweetened oatmeal. Quinoa. Brown or wild rice. Meats and other proteins Seafood. Poultry without skin. Lean cuts of poultry and beef. Tofu. Nuts. Seeds. Dairy Low-fat or fat-free dairy products such as milk, yogurt, and cheese. The items listed above may not be a complete list of foods and beverages you can eat. Contact a dietitian for more information. What foods should I avoid? Fruits Fruits canned with  syrup. Vegetables Canned vegetables. Frozen vegetables with butter or cream sauce. Grains Refined white flour and flour products such as bread, pasta, snack foods, and cereals. Avoid all processed foods. Meats and other proteins Fatty cuts of meat. Poultry with skin. Breaded or fried meats. Processed meat. Avoid saturated fats. Dairy Full-fat yogurt, cheese, or milk. Beverages Sweetened drinks, such as soda or iced tea. The items listed above may not be a complete list of foods and beverages you should avoid. Contact a dietitian for more information. Questions to ask a health care provider  Do I need to meet with a diabetes educator?  Do I need to meet with a dietitian?  What number can I call if I have questions?  When are the best times to check my blood glucose? Where to find more information:  American Diabetes Association: diabetes.org  Academy of Nutrition and Dietetics: www.eatright.[employer] of Diabetes and Digestive and Kidney Diseases: CarFlippers.tn  Association of Diabetes Care and Education Specialists: www.diabeteseducator.org Summary  It is important to have healthy eating habits because your blood sugar (glucose) levels are greatly affected by what you eat and drink.  A healthy meal plan will help you control your blood glucose and maintain a healthy lifestyle.  Your health care provider may recommend that you work with a dietitian to make a meal plan that is best for you.  Keep in mind that carbohydrates (carbs) and alcohol have immediate effects on your blood glucose levels. It is important to count carbs and to use alcohol carefully. This information is not intended to replace advice given to you by your health care provider. Make sure you discuss any questions you have with your health care provider. Document Revised: 10/07/2019 Document Reviewed: 10/07/2019 Elsevier Patient Education  2021 [employer].

## 2020-12-15 NOTE — Progress Notes (Signed)
Heart Failure Stewardship Progress Note  - Patient has been educated on current HF medications and potential additions to HF medication regimen - Patient verbalizes understanding that over the next few months, these medication doses may change and more medications may be added to optimize HF regimen - Patient has been educated on basic disease state pathophysiology and goals of therapy - Time spent (15 mins)  Sharen Hones, PharmD, BCPS Heart Failure Stewardship Pharmacist Phone [phone]  Please check [email or web address] for unit-specific pharmacist phone numbers

## 2020-12-15 NOTE — Progress Notes (Signed)
Heart Failure Nurse Navigator Progress Note  PCP: Salley Scarlet, MD  Presented with chest pain, NSTEMI. Cath and PCI completed by Dr. Excell Seltzer.   ECHO/ LVEF: 35-40%.   Clinical Course:  Past Medical History:  Diagnosis Date  . Colitis    Indeterminate cause  . Coronary artery disease   . Diabetes mellitus without complication (HCC)   . Hearing loss    High frequency bilateral  . History of broken nose    Multiple- has deviated septum  . History of colon polyps   . Hyperlipidemia   . Hypertension   . Ruptured lumbar disc    L4-L5 as a child    High Risk Criteria for Readmission and/or Poor Patient Outcomes:  Heart failure hospital admissions (last 6 months): 1   No Show rate: 0%  Difficult social situation: no  Demonstrates medication adherence: yes  Primary Language: english  Literacy level: able to read/write. Pt is Art gallery manager by trade.   Barriers of Care:   none  Considerations/Referrals:   Referral made to Heart Failure Pharmacist Stewardship: yes Referral made to [facility] TOC clinic: yes, appt Monday 2/7 @ 1pm, then infusion with research at 2pm  Items for Follow-up on DC/TOC: -medication optimization -pt education, new reduced EF (follow up with cards 2/15).  Ozella Rocks, RN, BSN Heart Failure Nurse Navigator [facility] Team [phone]

## 2020-12-15 NOTE — Discharge Summary (Addendum)
Discharge Summary    Patient ID: James Francis MRN: [id number]; DOB: 08/08/1971  Admit date: 12/13/2020 Discharge date: 12/15/2020  Primary Care Provider: Salley Scarlet, MD  Primary Cardiologist: Tonny Bollman, MD  Primary Electrophysiologist:  None   Discharge Diagnoses    Principal Problem:   NSTEMI (non-ST elevated myocardial infarction) [facility]) Active Problems:   Hyperlipidemia   Diabetes mellitus without complication [facility])   Ischemic cardiomyopathy   Diagnostic Studies/Procedures    Echo: 12/14/20  IMPRESSIONS    1. No left ventricular thrombus. There is evidence of ischemia/infarction  in the mid-LAD artery territory, with more severe involvement of the  anterior septum compared to the anterior wall, suggesting patent proximal  ramus intermedius or first diagonal  artery. Left ventricular ejection fraction, by estimation, is 35 to 40%.  The left ventricle has moderately decreased function. The left ventricle  demonstrates regional wall motion abnormalities (see scoring  diagram/findings for description). There is  moderate concentric left ventricular hypertrophy. Left ventricular  diastolic parameters are consistent with Grade II diastolic dysfunction  (pseudonormalization).  2. Right ventricular systolic function is normal. The right ventricular  size is normal. Tricuspid regurgitation signal is inadequate for assessing  PA pressure.  3. Left atrial size was mildly dilated.  4. The mitral valve is normal in structure. Trivial mitral valve  regurgitation.  5. The aortic valve is tricuspid. Aortic valve regurgitation is trivial.  No aortic stenosis is present.  6. Aortic dilatation noted. There is mild dilatation of the aortic root,  measuring 39 mm.   Comparison(s): Prior images unable to be directly viewed, comparison made  by report only. The left ventricular function is significantly worse. The  left ventricular wall motion abnormality is new.    Cath: 12/14/20    There is moderate to severe left ventricular systolic dysfunction.  LV end diastolic pressure is mildly elevated.  The left ventricular ejection fraction is 25-35% by visual estimate.  There is no aortic valve stenosis.  Dist RCA lesion is 50% stenosed.  2nd Mrg lesion is 50% stenosed.  Mid LAD lesion is 100% stenosed.  A drug-eluting stent was successfully placed using a STENT RESOLUTE ONYX 3.5X30.  Post intervention, there is a 0% residual stenosis.   Pain resolved after PCI.  Sx were intermittent over the prior three days.  Hopefully he will have recovery of LV function with revascularization.  Continue aggressive secondary prevention.  Medical therapy for LV dysfunction.   Check echo in AM.    Diagnostic Dominance: Right    Intervention      _____________   History of Present Illness     James Francis is a 50 y.o. male with with a history of hypertension, hyperlipidemia, type 2 diabetes who reports that since Friday of last week he has been having exertional chest pain.  He reported pain that was in the middle of his chest, worsened by exertion and relieved by rest.  He also has associated pain that radiated down his bilateral arms and was sharp and fatigue-like feeling that was also relieved by rest.  Denied significant orthopnea, PND, but he had noticed some mild shortness of breath with exertion.  Pain was not pleuritic.  It did not have a positional nature.  No recent sick contacts or respiratory infection symptoms.  No diarrhea, nausea, vomiting.  No significant PND.  He has no prior cardiovascular history but has a strong family history with early MIs in both his father and grandfather. He was admitted, started on  IV heparin with plans for cardiac cath.   Hospital Course     1. NSTEMI: hsTn 110 on admission. EKG showed SR with nonspecific changes. Underwent cardiac cath noted above with 100% occlusion of the mLAD which was treated with  PCI/DESx1. Does have residual disease of 50% dRCA, 50% 2nd OM which will be treated medically. Placed on DAPT with ASA/Brilinta for at least one year. No chest pain post cath. Worked well with cardiac rehab.   2. HTN: started on metoprolol 12.5mg  BID on admission which was consolidated to Toprol 25mg  daily. Continue on home lisinopril 5mg  daily at discharge  3. Mixed HLD: LDL 66, Trig 345 -- home statin was increased from atorvastatin 40mg  to 80mg  daily on admission -- started on Vascepa prior to discharge  -- FLP/LFTs in 8 weeks  4. DM: on metformin PTA which was held with plans for cath. Resumed at discharge appropriately -- Hgb A1c 8.1 -- started on Jardiance 10mg  daily at discharge  General: Well developed, well nourished, male appearing in no acute distress. Head: Normocephalic, atraumatic.  Neck: Supple without bruits, JVD. Lungs:  Resp regular and unlabored, CTA. Heart: RRR, S1, S2, no S3, S4, or murmur; no rub. Abdomen: Soft, non-tender, non-distended with normoactive bowel sounds. No hepatomegaly. No rebound/guarding. No obvious abdominal masses. Extremities: No clubbing, cyanosis, edema. Distal pedal pulses are 2+ bilaterally. Right radial cath site stable without bruising or hematoma Neuro: Alert and oriented X 3. Moves all extremities spontaneously. Psych: Normal affect.   Did the patient have an acute coronary syndrome (MI, NSTEMI, STEMI, etc) this admission?:  Yes                               AHA/ACC Clinical Performance & Quality Measures: 1. Aspirin prescribed? - Yes 2. ADP Receptor Inhibitor (Plavix/Clopidogrel, Brilinta/Ticagrelor or Effient/Prasugrel) prescribed (includes medically managed patients)? - Yes 3. Beta Blocker prescribed? - Yes 4. High Intensity Statin (Lipitor 40-80mg  or Crestor 20-40mg ) prescribed? - Yes 5. EF assessed during THIS hospitalization? - Yes 6. For EF <40%, was ACEI/ARB prescribed? - Yes 7. For EF <40%, Aldosterone Antagonist  (Spironolactone or Eplerenone) prescribed? - No - Reason:  consider as an outpatient, blood pressures soft 8. Cardiac Rehab Phase II ordered (including medically managed patients)? - Yes       _____________  Discharge Vitals Blood pressure 115/75, pulse 63, temperature 98.6 F (37 C), temperature source Oral, resp. rate 18, height 6' (1.829 m), weight 106.6 kg, SpO2 99 %.  Filed Weights   12/13/20 1820  Weight: 106.6 kg    Labs & Radiologic Studies    CBC Recent Labs    12/13/20 1826 12/15/20 0241  WBC 9.7 10.6*  HGB 15.7 14.6  HCT 46.2 42.8  MCV 85.2 84.9  PLT 271 215   Basic Metabolic Panel Recent Labs    0317 12/15/20 0241  NA 137 138  K 3.9 4.1  CL 101 103  CO2 23 25  GLUCOSE 201* 178*  BUN 13 12  CREATININE 1.02 1.10  CALCIUM 9.1 8.9  MG 1.8  --    Liver Function Tests Recent Labs    12/14/20 0317  AST 22  ALT 24  ALKPHOS 49  BILITOT 1.1  PROT 6.4*  ALBUMIN 4.0   No results for input(s): LIPASE, AMYLASE in the last 72 hours. High Sensitivity Troponin:   Recent Labs  Lab 12/13/20 1826 12/13/20 2127  TROPONINIHS 110* 103*  BNP Invalid input(s): POCBNP D-Dimer No results for input(s): DDIMER in the last 72 hours. Hemoglobin A1C Recent Labs    12/14/20 0339  HGBA1C 8.1*   Fasting Lipid Panel Recent Labs    12/14/20 0341  CHOL 179  HDL 44  LDLCALC 66  TRIG 345*  CHOLHDL 4.1   Thyroid Function Tests Recent Labs    12/14/20 0340  TSH 1.219   _____________  DG Chest 2 View  Result Date: 12/13/2020 CLINICAL DATA:  Chest pain for several days EXAM: CHEST - 2 VIEW COMPARISON:  None. FINDINGS: The heart size and mediastinal contours are within normal limits. Both lungs are clear. The visualized skeletal structures are unremarkable. IMPRESSION: No active cardiopulmonary disease. Electronically Signed   By: Alcide Clever M.D.   On: 12/13/2020 19:03   CARDIAC CATHETERIZATION  Result Date: 12/14/2020  There is moderate to  severe left ventricular systolic dysfunction.  LV end diastolic pressure is mildly elevated.  The left ventricular ejection fraction is 25-35% by visual estimate.  There is no aortic valve stenosis.  Dist RCA lesion is 50% stenosed.  2nd Mrg lesion is 50% stenosed.  Mid LAD lesion is 100% stenosed.  A drug-eluting stent was successfully placed using a STENT RESOLUTE ONYX 3.5X30.  Post intervention, there is a 0% residual stenosis.  Pain resolved after PCI.  Sx were intermittent over the prior three days.  Hopefully he will have recovery of LV function with revascularization.  Continue aggressive secondary prevention.  Medical therapy for LV dysfunction. Check echo in AM.    ECHOCARDIOGRAM COMPLETE  Result Date: 12/14/2020    ECHOCARDIOGRAM REPORT   Patient Name:   James Francis Date of Exam: 12/14/2020 Medical Rec #:  [id number]    Height:       72.0 in Accession #:    [id number]   Weight:       235.0 lb Date of Birth:  25-Sep-1971     BSA:          2.282 m Patient Age:    49 years     BP:           135/82 mmHg Patient Gender: M            HR:           66 bpm. Exam Location:  Inpatient Procedure: 2D Echo, Cardiac Doppler and Color Doppler Indications:    Acute ischemic heart disease  History:        Patient has prior history of Echocardiogram examinations, most                 recent 05/22/2016. Acute MI; Risk Factors:Hypertension,                 Dyslipidemia and Diabetes.  Sonographer:    Ross Ludwig RDCS (AE) Referring Phys: [id number] CARRIEL T NIPP IMPRESSIONS  1. No left ventricular thrombus. There is evidence of ischemia/infarction in the mid-LAD artery territory, with more severe involvement of the anterior septum compared to the anterior wall, suggesting patent proximal ramus intermedius or first diagonal artery. Left ventricular ejection fraction, by estimation, is 35 to 40%. The left ventricle has moderately decreased function. The left ventricle demonstrates regional wall motion abnormalities (see  scoring diagram/findings for description). There is moderate concentric left ventricular hypertrophy. Left ventricular diastolic parameters are consistent with Grade II diastolic dysfunction (pseudonormalization).  2. Right ventricular systolic function is normal. The right ventricular size is normal. Tricuspid regurgitation signal is inadequate for assessing  PA pressure.  3. Left atrial size was mildly dilated.  4. The mitral valve is normal in structure. Trivial mitral valve regurgitation.  5. The aortic valve is tricuspid. Aortic valve regurgitation is trivial. No aortic stenosis is present.  6. Aortic dilatation noted. There is mild dilatation of the aortic root, measuring 39 mm. Comparison(s): Prior images unable to be directly viewed, comparison made by report only. The left ventricular function is significantly worse. The left ventricular wall motion abnormality is new. FINDINGS  Left Ventricle: No left ventricular thrombus. There is evidence of ischemia/infarction in the mid-LAD artery territory, with more severe involvement of the anterior septum compared to the anterior wall, suggesting patent proximal ramus intermedius or first diagonal artery. Left ventricular ejection fraction, by estimation, is 35 to 40%. The left ventricle has moderately decreased function. The left ventricle demonstrates regional wall motion abnormalities. The left ventricular internal cavity size was normal in size. There is moderate concentric left ventricular hypertrophy. Left ventricular diastolic parameters are consistent with Grade II diastolic dysfunction (pseudonormalization). Normal left ventricular filling pressure.  LV Wall Scoring: The mid and distal anterior septum, apical inferior segment, and apex are akinetic. The apical lateral segment and apical anterior segment are hypokinetic. The anterior wall, antero-lateral wall, inferior wall, posterior wall, basal anteroseptal segment, mid inferoseptal segment, and basal  inferoseptal segment are normal. Right Ventricle: The right ventricular size is normal. No increase in right ventricular wall thickness. Right ventricular systolic function is normal. Tricuspid regurgitation signal is inadequate for assessing PA pressure. Left Atrium: Left atrial size was mildly dilated. Right Atrium: Right atrial size was normal in size. Pericardium: There is no evidence of pericardial effusion. Mitral Valve: The mitral valve is normal in structure. Trivial mitral valve regurgitation. MV peak gradient, 2.1 mmHg. The mean mitral valve gradient is 1.0 mmHg. Tricuspid Valve: The tricuspid valve is normal in structure. Tricuspid valve regurgitation is trivial. Aortic Valve: The aortic valve is tricuspid. Aortic valve regurgitation is trivial. Aortic regurgitation PHT measures 736 msec. No aortic stenosis is present. Aortic valve mean gradient measures 2.0 mmHg. Aortic valve peak gradient measures 3.3 mmHg. Aortic valve area, by VTI measures 3.12 cm. Pulmonic Valve: The pulmonic valve was not well visualized. Pulmonic valve regurgitation is not visualized. Aorta: Aortic dilatation noted. There is mild dilatation of the aortic root, measuring 39 mm. IAS/Shunts: No atrial level shunt detected by color flow Doppler.  LEFT VENTRICLE PLAX 2D LVIDd:         4.10 cm  Diastology LVIDs:         2.80 cm  LV e' medial:    7.94 cm/s LV PW:         1.40 cm  LV E/e' medial:  7.0 LV IVS:        1.70 cm  LV e' lateral:   7.62 cm/s LVOT diam:     2.30 cm  LV E/e' lateral: 7.3 LV SV:         56 LV SV Index:   25 LVOT Area:     4.15 cm  RIGHT VENTRICLE            IVC RV Basal diam:  3.00 cm    IVC diam: 2.00 cm RV S prime:     9.68 cm/s TAPSE (M-mode): 2.0 cm LEFT ATRIUM             Index       RIGHT ATRIUM           Index  LA diam:        4.00 cm 1.75 cm/m  RA Area:     12.60 cm LA Vol (A2C):   71.7 ml 31.42 ml/m RA Volume:   22.70 ml  9.95 ml/m LA Vol (A4C):   48.5 ml 21.26 ml/m LA Biplane Vol: 59.5 ml 26.08 ml/m   AORTIC VALVE AV Area (Vmax):    3.41 cm AV Area (Vmean):   3.04 cm AV Area (VTI):     3.12 cm AV Vmax:           91.50 cm/s AV Vmean:          66.100 cm/s AV VTI:            0.180 m AV Peak Grad:      3.3 mmHg AV Mean Grad:      2.0 mmHg LVOT Vmax:         75.20 cm/s LVOT Vmean:        48.400 cm/s LVOT VTI:          0.135 m LVOT/AV VTI ratio: 0.75 AI PHT:            736 msec  AORTA Ao Root diam: 3.90 cm Ao Asc diam:  3.30 cm MITRAL VALVE MV Area (PHT): 2.22 cm    SHUNTS MV Area VTI:   2.17 cm    Systemic VTI:  0.14 m MV Peak grad:  2.1 mmHg    Systemic Diam: 2.30 cm MV Mean grad:  1.0 mmHg MV Vmax:       0.72 m/s MV Vmean:      42.3 cm/s MV Decel Time: 342 msec MV E velocity: 55.90 cm/s MV A velocity: 45.20 cm/s MV E/A ratio:  1.24 Mihai Croitoru MD Electronically signed by Thurmon Fair MD Signature Date/Time: 12/14/2020/4:20:36 PM    Final    Disposition   Pt is being discharged home today in good condition.  Follow-up Plans & Appointments     Follow-up Information    [place], Sharrell Ku, [place] Follow up on 12/28/2020.   Specialty: Cardiology Why: at 2:15pm for your follow up appt Contact information: [address] [place] [address] [phone]        Salley Scarlet, MD Follow up.   Specialty: Family Medicine Why: Please arrange outpatient follow up in the next 3-4 weeks  Contact information: [address] [place] [address] [phone]              Discharge Instructions    AMB Referral to Cardiac Rehabilitation - Phase II   Complete by: As directed    Diagnosis:  Coronary Stents NSTEMI     After initial evaluation and assessments completed: Virtual Based Care may be provided alone or in conjunction with Phase 2 Cardiac Rehab based on patient barriers.: Yes   Call MD for:  difficulty breathing, headache or visual disturbances   Complete by: As directed    Call MD for:  persistant dizziness or light-headedness   Complete by: As directed     Call MD for:  redness, tenderness, or signs of infection (pain, swelling, redness, odor or green/yellow discharge around incision site)   Complete by: As directed    Diet - low sodium heart healthy   Complete by: As directed    Discharge instructions   Complete by: As directed    Radial Site Care Refer to this sheet in the next few weeks. These instructions provide you with information on  caring for yourself after your procedure. Your caregiver may also give you more specific instructions. Your treatment has been planned according to current medical practices, but problems sometimes occur. Call your caregiver if you have any problems or questions after your procedure. HOME CARE INSTRUCTIONS You may shower the day after the procedure.Remove the bandage (dressing) and gently wash the site with plain soap and water.Gently pat the site dry.  Do not apply powder or lotion to the site.  Do not submerge the affected site in water for 3 to 5 days.  Inspect the site at least twice daily.  Do not flex or bend the affected arm for 24 hours.  No lifting over 5 pounds (2.3 kg) for 5 days after your procedure.  Do not drive home if you are discharged the same day of the procedure. Have someone else drive you.  You may drive 24 hours after the procedure unless otherwise instructed by your caregiver.  What to expect: Any bruising will usually fade within 1 to 2 weeks.  Blood that collects in the tissue (hematoma) may be painful to the touch. It should usually decrease in size and tenderness within 1 to 2 weeks.  SEEK IMMEDIATE MEDICAL CARE IF: You have unusual pain at the radial site.  You have redness, warmth, swelling, or pain at the radial site.  You have drainage (other than a small amount of blood on the dressing).  You have chills.  You have a fever or persistent symptoms for more than 72 hours.  You have a fever and your symptoms suddenly get worse.  Your arm becomes pale, cool, tingly, or numb.   You have heavy bleeding from the site. Hold pressure on the site.   PLEASE DO NOT MISS ANY DOSES OF YOUR BRILINTA!!!!! Also keep a log of you blood pressures and bring back to your follow up appt. Please call the office with any questions.   Patients taking blood thinners should generally stay away from medicines like ibuprofen, Advil, Motrin, naproxen, and Aleve due to risk of stomach bleeding. You may take Tylenol as directed or talk to your primary doctor about alternatives.  PLEASE ENSURE THAT YOU DO NOT RUN OUT OF YOUR BRILINTA.This medication is very important to remain on for at least one year. IF you have issues obtaining this medication due to cost please CALL the office 3-5 business days prior to running out in order to prevent missing doses of this medication.   Increase activity slowly   Complete by: As directed       Discharge Medications   Allergies as of 12/15/2020   No Known Allergies     Medication List    STOP taking these medications   terbinafine 250 MG tablet Commonly known as: LamISIL     TAKE these medications   aspirin 81 MG chewable tablet Chew 1 tablet (81 mg total) by mouth daily. Start taking on: December 16, 2020   atorvastatin 80 MG tablet Commonly known as: LIPITOR Take 1 tablet (80 mg total) by mouth daily. Start taking on: December 16, 2020 What changed:   medication strength  how much to take   BLOOD GLUCOSE TEST STRIPS Strp Please dispense based on patient and insurance preference. Use as directed to monitor FSBS 1x daily. Dx: E11.9.   empagliflozin 10 MG Tabs tablet Commonly known as: Jardiance Take 1 tablet (10 mg total) by mouth daily before breakfast.   icosapent Ethyl 1 g capsule Commonly known as: VASCEPA Take 2  capsules (2 g total) by mouth 2 (two) times daily.   lisinopril 5 MG tablet Commonly known as: ZESTRIL TAKE 1 TABLET BY MOUTH  DAILY   metFORMIN 1000 MG tablet Commonly known as: GLUCOPHAGE Take 0.5 tablets (500  mg total) by mouth 2 (two) times daily with a meal.   metoprolol succinate 25 MG 24 hr tablet Commonly known as: TOPROL-XL Take 1 tablet (25 mg total) by mouth daily.   nitroGLYCERIN 0.4 MG SL tablet Commonly known as: NITROSTAT Place 1 tablet (0.4 mg total) under the tongue every 5 (five) minutes x 3 doses as needed for chest pain.   ticagrelor 90 MG Tabs tablet Commonly known as: BRILINTA Take 1 tablet (90 mg total) by mouth 2 (two) times daily.       Outstanding Labs/Studies   FLP/LFTs in 8 weeks  Duration of Discharge Encounter   Greater than 30 minutes including physician time.  Signed, Laverda Page, NP 12/15/2020, 9:34 AM  Patient seen, examined. Available data reviewed. Agree with findings, assessment, and plan as outlined by Laverda Page, NP.  The patient is independently interviewed and examined.  I reviewed his cardiac catheterization films from yesterday.  On his physical exam today, he is alert, oriented, in no distress.  JVP is normal, lungs are clear, heart is regular rate and rhythm with no murmur gallop, right radial site has mild hematoma and mild associated tenderness.  Lower extremities without edema.  Echocardiogram reviewed and shows LVEF 35 to 40%.  The patient will be discharged on metoprolol succinate and lisinopril at low doses based on his borderline blood pressure.  He is on dual antiplatelet therapy with aspirin and ticagrelor.  Other medication changes outlined above and I am in full agreement.  Will arrange outpatient follow-up.  He will work from home next week and return to work the week after.  Tonny Bollman, M.D. 12/15/2020 9:39 AM

## 2020-12-15 NOTE — Telephone Encounter (Signed)
Pt insurance is active and benefits verified through [employer] $40, DED $600/$0 met, out of pocket $3,000/$0 met, co-insurance 0%. no pre-authorization required. Passport, 12/15/2020'@4' :23pm, REF# [id number]  Will contact patient to see if he is interested in the Cardiac Rehab Program. If interested, patient will need to complete follow up appt. Once completed, patient will be contacted for scheduling upon review by the RN Navigator.

## 2020-12-16 ENCOUNTER — Telehealth (HOSPITAL_COMMUNITY): Payer: Self-pay | Admitting: Pharmacist

## 2020-12-16 NOTE — Telephone Encounter (Signed)
Transitions of Care Pharmacy  ° °Call attempted for a pharmacy transitions of care follow-up. HIPAA appropriate voicemail was left with call back information provided.  ° °Call attempt #1. Will follow-up in 2-3 days.  °  °

## 2020-12-16 NOTE — Telephone Encounter (Signed)
Pharmacy Transitions of Care Follow-up Telephone Call  Date of discharge: 12/15/2020  Discharge Diagnosis: NSTEMI with stent  How have you been since you were released from the hospital? Well, no issues or questions  Medication changes made at discharge:  Start: Metoprolol Succinate Vascepa Jardiance Brilinta Aspirin  Change: Atorvastatin (increase to 80mg )  Continue: Lisinopril Metformin  Stop: Terbinafine  Medication changes obtained and verified? Yes    Medication Accessibility:  Home Pharmacy: [facility]   Was the patient provided with refills on discharged medications? Yes  Have all prescriptions been transferred from [facility] to home pharmacy? Yes   Is the patient able to afford medications? Yes . Notable copays: [facility], and Brilinta Eligible patient assistance: Copay cards available for each  Medication Review:  TICAGRELOR (BRILINTA) Ticagrelor 90 mg BID initiated on 12/14/2020.  - Educated patient on expected duration of therapy of aspirin 81mg  daily with ticagrelor. Advised patient that dose of ticagrelor will be reduced to 60mg  BID after 1 year.  Aspirin will be continued indefinitely. - Discussed importance of taking medication around the same time every day, - Reviewed potential DDIs with patient - Advised patient of medications to avoid (NSAIDs, aspirin maintenance doses>100 mg daily) - Educated that Tylenol (acetaminophen) will be the preferred analgesic to prevent risk of bleeding  - Emphasized importance of monitoring for signs and symptoms of bleeding (abnormal bruising, prolonged bleeding, nose bleeds, bleeding from gums, discolored urine, black tarry stools)  - Educated patient to notify doctor if shortness of breath or abnormal heartbeat occur - Advised patient to alert all providers of antiplatelet therapy prior to starting a new medication or having a procedure   Follow-up Appointments:  PCP Hospital f/u appt confirmed? Scheduled  to see Dr. 02/11/2021 in 3-4 weeks.  Pt is aware of the need to schedule this appointment and will do so soon.    Specialist Hospital f/u appt confirmed? [facility] 12/20/2020 @ 1:00 pm Infusion Center appointments recurring weekly, scheduled 2/7, 2/14, and 2/21 [facility] with Dr. 4/7 on 12/28/2020 @ 2:00 pm   If their condition worsens, is the pt aware to call PCP or go to the Emergency Dept.? yes  Final Patient Assessment: Pt seems to be doing very well and was aware of all new meds and med changes.  He is taking medications correctly with no issues.  He is aware of all follow-up appointments.  The only outstanding issues are the Vascepa rx, pt will follow-up with mail order pharmacy or cardiology with questions about coverage.  I will also reach out to cardiology about high cost medications and notify that coupons are available.

## 2020-12-17 NOTE — Research (Signed)
AEGIS Informed Consent   Subject Name: James Francis  Subject met inclusion and exclusion criteria.  The informed consent form, study requirements and expectations were reviewed with the subject and questions and concerns were addressed prior to the signing of the consent form.  The subject verbalized understanding of the trail requirements.  The subject agreed to participate in the AEGIS trial and signed the informed consent.  The informed consent was obtained prior to performance of any protocol-specific procedures for the subject.  A copy of the signed informed consent was given to the subject and a copy was placed in the subject's medical record.  Philemon Kingdom D 12/15/2020, 2957 AM

## 2020-12-17 NOTE — Research (Signed)
AEGIS V2  Patient doing well, no complaints of chest pains or shortness of breath. Patient did well with infusion. Will see her back next week for infusion 2.                                     "CONSENT"   YES     NO   Continuing further Investigational Product and study visits for follow-up? [x]  []   Continuing consent from future biomedical research [x]  []                                    "EVENTS"    YES     NO  AE   (IF YES SEE SOURCE) []  [x]   SAE  (IF YES SEE SOURCE) []  [x]   ENDPOINT   (IF YES SEE SOURCE) []  [x]   REVASCULARIZATION  (IF YES SEE SOURCE) []  [x]   AMPUTATION   (IF YES SEE SOURCE) []  [x]   TROPONIN'S  (IF YES SEE SOURCE) []  [x]          No current facility-administered medications for this encounter.  Current Outpatient Medications:  .  empagliflozin (JARDIANCE) 10 MG TABS tablet, Take 1 tablet (10 mg total) by mouth daily before breakfast., Disp: 30 tablet, Rfl: 2 .  icosapent Ethyl (VASCEPA) 1 g capsule, Take 2 capsules (2 g total) by mouth 2 (two) times daily., Disp: 120 capsule, Rfl: 3 .  lisinopril (ZESTRIL) 5 MG tablet, TAKE 1 TABLET BY MOUTH  DAILY (Patient taking differently: Take 5 mg by mouth daily.), Disp: 90 tablet, Rfl: 3 .  metFORMIN (GLUCOPHAGE) 1000 MG tablet, Take 0.5 tablets (500 mg total) by mouth 2 (two) times daily with a meal., Disp: 90 tablet, Rfl: 3 .  aspirin 81 MG chewable tablet, Chew 1 tablet (81 mg total) by mouth daily., Disp: 90 tablet, Rfl: 1 .  atorvastatin (LIPITOR) 80 MG tablet, Take 1 tablet (80 mg total) by mouth daily., Disp: 90 tablet, Rfl: 1 .  Glucose Blood (BLOOD GLUCOSE TEST STRIPS) STRP, Please dispense based on patient and insurance preference. 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  metoprolol succinate (TOPROL-XL) 25 MG 24 hr tablet, Take 1 tablet (25 mg total) by mouth daily., Disp: 90 tablet, Rfl: 1 .  nitroGLYCERIN (NITROSTAT) 0.4 MG SL tablet, Place 1 tablet (0.4 mg total) under  the tongue every 5 (five) minutes x 3 doses as needed for chest pain., Disp: 25 tablet, Rfl: 2 .  ticagrelor (BRILINTA) 90 MG TABS tablet, Take 1 tablet (90 mg total) by mouth 2 (two) times daily., Disp: 180 tablet, Rfl: 2

## 2020-12-17 NOTE — Research (Signed)
AEGIS:   Inclusion/Exclusion Checklist:   Inclusions:   Y N   '[x]'  '[]'  Male or male at least 50 years of age  '[x]'  '[]'  Evidence of type I (spontaneous) MI (STEMI or NSTEMI) caused by atherothrombotic artery disease as defined by the following:  '[x]'  '[]'  a. Detection of a rise and/or fall in Troponin I or T with at least 1 value about the 99% upper reference limit.     (AND)---  Any 1 or more of the following:   '[x]'  '[]'  - symptoms of ischemia (ie, resulting from a primary coronary    artery event)  '[]'  '[]'  - New or presumably new significant ST/T wave changes or left bundle branch block.  '[]'  '[]'       - Development of pathological Q waves on EKG  '[]'  '[]'  - Imaging evidence of new loss or viable myocardium or regional wall motion abnormality.  '[]'  '[]'  - ID of intracoronary thrombus by angiography.  '[x]'  '[]'  No suspicion of acute kidney injury at least 12 hours after angiography OR after first medical contract for subject's not undergoing angiography There must be documented evidence of stable renal function defined as no more than an increase in Serum Creatinine < 0.50m/dl from pre-contrast serum creatinine value.  (Before _1.02_   12 hrs after _1.10_)    Evidence of multi-vessel coronary artery disease defined as:  '[x]'  '[]'  A. At least 50% stenosis of theleft main coronary artery or at least 2 epicardial coronary artery territories (LAD, LCx, RCA) on catherization performed during the index hospitalization.   '[]'  '[]'  B. Prior cardiac catherization documenting @ least 50% stenosis of the LM or at least 2 epicardial >1 epicardial artery territories (LAD, LCx, RCA)  '[]'  '[]'  C. Prior PCI and evidence of 50% stenosis of at least 1 epicardial coronary artery territory different from prior revascularized artery territory.   '[]'  '[]'  D. Prior multivessel coronary artery bypass grafting.  '[x]'  '[]'  Plus either Established risk factors:  '[x]'  '[]'  o On pharmacological treatment for diabetes mellitus                 OR    TWO of  the following  '[]'  '[]'  o Prior history of MI  '[]'  '[]'  o Age ? 65 years  '[]'  '[]'  o Peripheral arterial disease defined as meeting at least 1 of the following criteria:  '[]'  '[]'          +   Current intermittent claudication or resting limb ischemia and ABI    ?0.90  '[]'  '[]'          +   History of peripheral revascularization (surgical or percutaneous)  '[]'  '[]'          +  History of limb amputation due to PAD  '[]'  '[]'          +  Angiographic evidence (using computed tomographic angiography, MRA, or invasive angiography or a peripheral artery stenosis ?50%.  '[]'  '[]'  If the male subject without child bearing potential, not breastfeeding, not pregnant, and if of child bearing potential agree to contraception or lifestyle methods to avoid pregnancy? Child-bearing potential (must select all)   ____ not pregnant (by urine or serum hCG AND   ____ willing to use an acceptable method of contraception to avoid pregnancy during the study and for 3 months after last dose of investional product (refer to acceptable methods per protocol)   ____ if breastfeeding, willing to cease breastfeeding Date of pregnancy test: (____ /_____/ ________)  Result  - or + Not of Child bearing potential (select one)   ____ Age >= 23   ____ Age 57-60 with amenorrhea for at least 1 year with documented evidence of follicle-stimulating hormone level >40 IU/L   ____ Surgically sterile for at least 3 months prior to randomization  '[x]'  '[]'  Investigator believes that the subject is willing and able to adhere to all protocol requirements.   '[x]'  '[]'  Willing to not participate in another investigational study until completion of their final study visit.     Exclusions:  Y N   '[]'  '[x]'  If these are the reason for MI (pt is excluded)  '[]'  '[x]'  1. Myocardial necrosis due mismatch between myocardial oxygen demand and supply, usually due to fixed coronary disease with increased demand leading to MI  '[]'  '[x]'  2. Cardiac death due to MI  '[]'  '[x]'  3. Myocardial  necrosis due to complications from a PCI  '[]'  '[x]'  4. Myocardial necrosis due to stent thrombosis  '[]'  '[x]'  5. Myocardial necrosis due to in stent restenosis as the only etiology  '[]'  '[x]'  6. Myocardial necrosis in the stenting of coronary artery bypass grafting  '[]'  '[x]'  Ongoing hemodynamic instability  '[]'  '[x]'       +  History of NYHA Class III or IV heart failure within the last year  '[]'  '[x]'       +  Killip Class III or IV heart failure  '[]'  '[x]'       +  Sustained and/or symptomatic hypotension (SBP <90 mm HG)  '[]'  '[x]'       +  Known left ventricular ejection fraction of <30%  '[]'  '[x]'  Evidence of hepatobiliary disease as indicated by any 1 or more of the   following at screening:  '[]'  '[x]'       +  Current active hepatic dysfunction or active biliary obstruction  '[]'  '[x]'       +       +  Chronic or prior history of cirrhosis or of infectious / inflammatory hepatitis NOTE: If a patient has a medical history of recovered Hep A, B, or C without evidence of cirrhosis, he/she could be considered for inclusion if there is documented evidence that there is no active infection (ie, antigen negative)  '[]'  '[x]'       + Hepatic lab abnormalities: ALT > 3 x ULN or Total bilirubin > 2x ULN at randomization.  '[]'  '[x]'  Severe chronic kidney disease (eGFR of <19m) or on dialysis  '[]'  '[x]'  Plan to undergo scheduled coronary artery bypass graft surgery after randomization, as determined at the time of screening  '[]'  '[x]'  Known history of allergies to soybeans, peanuts, albumin  '[]'  '[x]'  Body weight <50 kg  '[]'  '[x]'  A known history of IgA deficiency or antibodies to IgA  '[]'  '[x]'  A comorbid condition with an estimated life expectancy of ? 6 months at time of consent  '[]'  '[x]'  Women who are pregnant or breastfeeding at time of randomization  '[]'  '[x]'  Participated in another interventional clinical study at the time of consent  '[]'  '[x]'  Treatment with anticancer therapy  '[]'  '[x]'  Previously randomized or participated in this study or previously exposed  to CMorris    I HAVE NO PROBLEMS WALKING '[x]'   I HAVE SLIGHT PROBLEMS WALKING '[]'   I HAVE MODERATE PROBLEMS WALKING '[]'   I HAVE SEVERE PROBLEMS WALKING '[]'   I AM UNABLE TO WALK  '[]'     SELF-CARE:   I HAVE NO PROBLEMS WASING OR  DRESSING MYSELF  '[x]'   I HAVE SLIGHT PROBLEMS WASHING OR DRESSING MYSELF  '[]'   I HAVE MODERATE PROBLEMS WASHING OR DRESSING MYSELF '[]'   I HAVE SEVERE PROBLEMS WASHING OR DRESSING MYSELF  '[]'   I HAVE SEVERE PROBLEMS WASHING OR DRESSING MYSELF  '[]'   I AM UNABLE TO [place] OR DRESS MYSELF '[]'     USUAL ACTIVITIES: (E.G. WORK/STUDY/HOUSEWORK/FAMILY OR LEISURE ACTIVITIES.    I HAVE NO PROBLEMS DOING MY USUAL ACTIVITIES '[x]'   I HAVE SLIGHT PROBLEMS DOING MY USUAL ACTIVITIES '[]'   I HAVE MODERATE PROBLEMS DOING MY USUAL ACTIVIITIES '[]'   I HAVE SEVERE PROBLEMS DOING MY USUAL ACTIVITIES '[]'   I AM UNABLE TO DO MY USUAL ACTIVITIES '[]'     PAIN /DISCOMFORT   I HAVE NO PAIN OR DISCOMFORT '[x]'   I HAVE SLIGHT PAIN OR DISCOMFORT '[]'   I HAVE MODERATE PAIN OR DISCOMFORT '[]'   I HAVE SEVERE PAIN OR DISCOMFORT '[]'   I HAVE EXTREME PAIN OR DISCOMFORT '[]'     ANXIETY/DEPRESSION   I AM NOT ANXIOUS OR DEPRESSED '[x]'   I AM SLIGHTLY ANXIOUS OR DEPRESSED '[]'   I AM MODERATELY ANXIOUS OR DREPRESSED '[]'   I AM SEVERELY ANXIOUS OR DEPRESSED '[]'   I AM EXTREMELY ANXIOUS OR DEPRESSED '[]'     SCALE OF 0-100 HOW WOULD YOU RATE TODAY?  0 IS THE WORSE AND 100 IS THE BEST HEALTH YOU CAN IMAGINE: 35   DEMOGRAPHICS:  Patient Name: James Francis "Len"  Birth Date: [birth year]  Sex: Male  Race: white  Child Bearing: ? Yes    ? No ? Tubial ligation ? Hysterectomy  ? postmenopausal   Height:  183 cm Weight:  106 kg   Index Procedure:  Onset date of symptoms: 29-Jan-22 Onset of symptoms: 08:00  Date of First contact at hospital: 31-Jan-22 Time of first contact at hospital: 18:20  Admission Date: 1-Feb-22   Discharge Date: 2-Feb-22 Discharge Time: 13:42   Vital Signs: Date 12/13/2020    Time: 18:20 BP:  150/93  Pulse: 63    Concomitant medications: Every visit: ? See med sheet  BMP Pre Contrast IV 12/14/2020 @ 0317 = 1.02   CMP Post Contrast IV 12 hours later: 12/15/2020 @ 0241 = 1.10 Hepatic Panel: 12/14/2020 @ 0317 ALT: 24 Total Bili: 1.1 Direct Bili: ND   Medical History:  ? CAD ? Prior MI ? PAD  ? History of Heart Failure ? Moderate to severe valvular dx ? AFib  ? Prior Coronary Revascularization  if YES please select Yes or No below:  CABG ? Yes   ? No           PCI with stent ? Yes   ? No          PCI without stent ? Yes ? No  ? CVA if checked please select one of the following Choose an item.  ? Hypertension  ? Gilberts syndrome ? CKD   ? Hypocholesteremia ? DM ? Smoker        ? eCigarette  Killip Class Stage 1     EQ-5D-3L ? 35   Future Biomedical Research: Consented ? Yes     ? No If no please date they withdrew consent from biomedical research Click or tap to enter a date.  Central Labs Before Start of Infusion: ? Biochemistry panel      ? Hematology     ? Immunogenicity   (30 mins before infusion)   ? Parvovirus  ? FBR sample ? PK/PD sample Central Labs End of Infusion:   ?  PK/PD Central Blood Draw Time: Before SOI:  _02/12/2020 @ 1055_ After EOI: _02/12/2020 @ 1259_ Infusion Start Time: 12/15/2020 10:58 AM  Infusion End Time: 12/15/2020 12:57 AM VS 12/15/2020 @ 0928   Component     Latest Ref Rng & Units 12/13/2020 12/14/2020 12/15/2020         6:26 PM  3:17 AM  2:41 AM  Sodium     135 - 145 mmol/L 138 137 138  Potassium     3.5 - 5.1 mmol/L 4.3 3.9 4.1  Chloride     98 - 111 mmol/L 101 101 103  CO2     22 - 32 mmol/L '26 23 25  ' Glucose     70 - 99 mg/dL 183 (H) 201 (H) 178 (H)  BUN     6 - 20 mg/dL '13 13 12  ' Creatinine     0.61 - 1.24 mg/dL 0.99 1.02 1.10  Calcium     8.9 - 10.3 mg/dL 9.7 9.1 8.9  Total Protein     6.5 - 8.1 g/dL  6.4 (L)   Albumin     3.5 - 5.0 g/dL  4.0   AST     15 - 41 U/L  22   ALT     0 - 44 U/L  24   Alkaline Phosphatase     38 -  126 U/L  49   Total Bilirubin     0.3 - 1.2 mg/dL  1.1   GFR, Estimated     >60 mL/min >60 >60 >60  Anion gap     5 - '15 11 13 ' 10

## 2020-12-20 ENCOUNTER — Encounter (HOSPITAL_COMMUNITY)
Admit: 2020-12-20 | Discharge: 2020-12-20 | Disposition: A | Discharge status: Home / Self Care | Payer: 59 | Attending: Internal Medicine | Admitting: Internal Medicine

## 2020-12-20 ENCOUNTER — Other Ambulatory Visit: Payer: Self-pay | Admitting: Cardiovascular Disease

## 2020-12-20 ENCOUNTER — Encounter: Payer: 59 | Admitting: *Deleted

## 2020-12-20 ENCOUNTER — Other Ambulatory Visit: Payer: Self-pay

## 2020-12-20 ENCOUNTER — Encounter (HOSPITAL_COMMUNITY): Payer: Self-pay

## 2020-12-20 ENCOUNTER — Ambulatory Visit (HOSPITAL_COMMUNITY)
Admit: 2020-12-20 | Discharge: 2020-12-20 | Disposition: A | Discharge status: Home / Self Care | Payer: 59 | Attending: Internal Medicine | Admitting: Internal Medicine

## 2020-12-20 VITALS — BP 113/68 | HR 58

## 2020-12-20 VITALS — BP 98/60 | HR 56 | Ht 72.0 in | Wt 229.0 lb

## 2020-12-20 DIAGNOSIS — Z8379 Family history of other diseases of the digestive system: Secondary | ICD-10-CM | POA: Insufficient documentation

## 2020-12-20 DIAGNOSIS — I255 Ischemic cardiomyopathy: Secondary | ICD-10-CM

## 2020-12-20 DIAGNOSIS — Z7982 Long term (current) use of aspirin: Secondary | ICD-10-CM | POA: Insufficient documentation

## 2020-12-20 DIAGNOSIS — I5042 Chronic combined systolic (congestive) and diastolic (congestive) heart failure: Secondary | ICD-10-CM | POA: Insufficient documentation

## 2020-12-20 DIAGNOSIS — Z79899 Other long term (current) drug therapy: Secondary | ICD-10-CM | POA: Insufficient documentation

## 2020-12-20 DIAGNOSIS — Z955 Presence of coronary angioplasty implant and graft: Secondary | ICD-10-CM | POA: Diagnosis not present

## 2020-12-20 DIAGNOSIS — E669 Obesity, unspecified: Secondary | ICD-10-CM | POA: Insufficient documentation

## 2020-12-20 DIAGNOSIS — E785 Hyperlipidemia, unspecified: Secondary | ICD-10-CM | POA: Insufficient documentation

## 2020-12-20 DIAGNOSIS — I252 Old myocardial infarction: Secondary | ICD-10-CM | POA: Insufficient documentation

## 2020-12-20 DIAGNOSIS — Z6831 Body mass index (BMI) 31.0-31.9, adult: Secondary | ICD-10-CM | POA: Diagnosis not present

## 2020-12-20 DIAGNOSIS — I110 Hypertensive heart disease with heart failure: Secondary | ICD-10-CM | POA: Insufficient documentation

## 2020-12-20 DIAGNOSIS — Z7984 Long term (current) use of oral hypoglycemic drugs: Secondary | ICD-10-CM | POA: Diagnosis not present

## 2020-12-20 DIAGNOSIS — E119 Type 2 diabetes mellitus without complications: Secondary | ICD-10-CM

## 2020-12-20 DIAGNOSIS — E782 Mixed hyperlipidemia: Secondary | ICD-10-CM

## 2020-12-20 DIAGNOSIS — I2510 Atherosclerotic heart disease of native coronary artery without angina pectoris: Secondary | ICD-10-CM | POA: Diagnosis not present

## 2020-12-20 DIAGNOSIS — Z006 Encounter for examination for normal comparison and control in clinical research program: Secondary | ICD-10-CM

## 2020-12-20 DIAGNOSIS — E6609 Other obesity due to excess calories: Secondary | ICD-10-CM | POA: Diagnosis not present

## 2020-12-20 DIAGNOSIS — Z597 Insufficient social insurance and welfare support: Secondary | ICD-10-CM | POA: Diagnosis not present

## 2020-12-20 DIAGNOSIS — Z6830 Body mass index (BMI) 30.0-30.9, adult: Secondary | ICD-10-CM

## 2020-12-20 MED ORDER — STUDY - AEGIS II STUDY - PLACEBO OR CSL112 (PI-HILTY)
170.0000 mL | Freq: Once | INTRAVENOUS | Status: AC
  Administered 2020-12-20: 170 mL via INTRAVENOUS
  Filled 2020-12-20: qty 170

## 2020-12-20 MED ORDER — ENTRESTO 24-26 MG PO TABS: 1.0000 | ORAL_TABLET | Freq: Two times a day (BID) | ORAL | 3 refills | Status: DC

## 2020-12-20 NOTE — Patient Instructions (Addendum)
Stop Lisinopril  Start Entresto 24/26 mg Twice daily STARTING ON Thursday 12/23/20  Thank you for allowing [place] to provider your heart failure care after your recent hospitalization. Please follow-up with our [facility] in 2 weeks.  If you have any questions or concerns before your next appointment please send [place] a message through [place] or call our office at [phone].    TO LEAVE A MESSAGE FOR THE NURSE SELECT OPTION 2, PLEASE LEAVE A MESSAGE INCLUDING: . YOUR NAME . DATE OF BIRTH . CALL BACK NUMBER . REASON FOR CALL**this is important as we prioritize the call backs  YOU WILL RECEIVE A CALL BACK THE SAME DAY AS LONG AS YOU CALL BEFORE 4:00 PM  At the [facility], you and your health needs are our priority. As part of our continuing mission to provide you with exceptional heart care, we have created designated Provider Care Teams. These Care Teams include your primary Cardiologist (physician) and Advanced Practice Providers (APPs- Physician Assistants and Nurse Practitioners) who all work together to provide you with the care you need, when you need it.   You may see any of the following providers on your designated Care Team at your next follow up: Marland Kitchen Dr Arvilla Meres . Dr Marca Ancona . Tonye Becket, NP . Robbie Lis, PA . Shanda Bumps [place],NP . Karle Plumber, PharmD   Please be sure to bring in all your medications bottles to every appointment.

## 2020-12-20 NOTE — Progress Notes (Addendum)
[facility]  IHK:[id number], Lonell Grandchild Primary Cardiologist: Sherren Mocha  HPI:  James Francis is a 50 yo male with PMH significant for metabolic syndrome with Obesity, T2DM, HLD, HTN, CAD s/p recent NSTEMI with PCI to mid LAD.    Primarily managed by his PCP before NSTEMI admission.  No prior cardiac history or admissions. Strong family history of cardiac disease.      Around 3 days leading up until 12/13/2020 he began having worsening exertional chest pain.  He had some elevations of his troponin to just above 100 but stayed relatively flat.  His EKG findings were not suggestive of STEMI some nonspecific ST changes.  He was treated as an NSTEMI and taken for left heart cath following morning.  With results below:    There is moderate to severe left ventricular systolic dysfunction.  LV end diastolic pressure is mildly elevated.  The left ventricular ejection fraction is 25-35% by visual estimate.  There is no aortic valve stenosis.  Dist RCA lesion is 50% stenosed.  2nd Mrg lesion is 50% stenosed.  Mid LAD lesion is 100% stenosed.  A drug-eluting stent was successfully placed using a STENT RESOLUTE ONYX 3.5X30.  Post intervention, there is a 0% residual stenosis.   Pain resolved after PCI.  Sx were intermittent over the prior three days.  Hopefully he will have recovery of LV function with revascularization.  Continue aggressive secondary prevention.  Medical therapy for LV dysfunction  He also had an ECHO 12/14/20:  1. No left ventricular thrombus. There is evidence of ischemia/infarction  in the mid-LAD artery territory, with more severe involvement of the  anterior septum compared to the anterior wall, suggesting patent proximal  ramus intermedius or first diagonal  artery. Left ventricular ejection fraction, by estimation, is 35 to 40%.  The left ventricle has moderately decreased function. The left ventricle  demonstrates regional wall motion abnormalities (see  scoring  diagram/findings for description). There is  moderate concentric left ventricular hypertrophy. Left ventricular  diastolic parameters are consistent with Grade II diastolic dysfunction  (pseudonormalization).   2. Right ventricular systolic function is normal. The right ventricular  size is normal. Tricuspid regurgitation signal is inadequate for assessing  PA pressure.   3. Left atrial size was mildly dilated.   4. The mitral valve is normal in structure. Trivial mitral valve  regurgitation.   5. The aortic valve is tricuspid. Aortic valve regurgitation is trivial.  No aortic stenosis is present.   6. Aortic dilatation noted. There is mild dilatation of the aortic root,  measuring 39 mm.   He feels good since leaving the hospital.  James Francis two miles on Saturday around his neighborhood didn't have to stop, and denies any shortness of breath or chest pain.  No dizziness or lightheadedness.  Says his bp runs low generally.      ROS: All systems negative except as listed in HPI, PMH and Problem List.  SH:  Social History   Socioeconomic History  . Marital status: Married    Spouse name: Not on file  . Number of children: 2  . Years of education: Not on file  . Highest education level: Not on file  Occupational History  . Occupation: Chief Financial Officer  Tobacco Use  . Smoking status: Never Smoker  . Smokeless tobacco: Never Used  Vaping Use  . Vaping Use: Never used  Substance and Sexual Activity  . Alcohol use: Yes    Alcohol/week: 0.0 standard drinks    Comment: occasionally   .  Drug use: No  . Sexual activity: Not Currently  Other Topics Concern  . Not on file  Social History Narrative  . Not on file   Social Determinants of Health   Financial Resource Strain: Low Risk   . Difficulty of Paying Living Expenses: Not hard at all  Food Insecurity: No Food Insecurity  . Worried About Charity fundraiser in the Last Year: Never true  . Ran Out of Food in the Last Year:  Never true  Transportation Needs: No Transportation Needs  . Lack of Transportation (Medical): No  . Lack of Transportation (Non-Medical): No  Physical Activity: Not on file  Stress: Not on file  Social Connections: Not on file  Intimate Partner Violence: Not At Risk  . Fear of Current or Ex-Partner: No  . Emotionally Abused: No  . Physically Abused: No  . Sexually Abused: No    FH:  Family History  Problem Relation Age of Onset  . Cancer Mother        unknown type  . Kidney Stones Mother   . Heart disease Father        MI age 78, triple Bypass/ death age 50  . Cancer Maternal Grandmother        Ovarian  . Heart disease Paternal Grandfather        MI  . Diabetes Paternal Grandfather   . Crohn's disease Paternal Aunt   . Colon cancer Neg Hx   . Stomach cancer Neg Hx   . Rectal cancer Neg Hx   . Esophageal cancer Neg Hx   . Liver cancer Neg Hx     Past Medical History:  Diagnosis Date  . Colitis    Indeterminate cause  . Coronary artery disease   . Diabetes mellitus without complication ([place])   . Hearing loss    High frequency bilateral  . History of broken nose    Multiple- has deviated septum  . History of colon polyps   . Hyperlipidemia   . Hypertension   . Ruptured lumbar disc    L4-L5 as a child    Current Outpatient Medications  Medication Sig Dispense Refill  . aspirin 81 MG chewable tablet Chew 1 tablet (81 mg total) by mouth daily. 90 tablet 1  . atorvastatin (LIPITOR) 80 MG tablet Take 1 tablet (80 mg total) by mouth daily. 90 tablet 1  . empagliflozin (JARDIANCE) 10 MG TABS tablet Take 1 tablet (10 mg total) by mouth daily before breakfast. 30 tablet 2  . Glucose Blood (BLOOD GLUCOSE TEST STRIPS) STRP Please dispense based on patient and insurance preference. Use as directed to monitor FSBS 1x daily. Dx: E11.9. 100 each 1  . icosapent Ethyl (VASCEPA) 1 g capsule Take 2 capsules (2 g total) by mouth 2 (two) times daily. 120 capsule 3  . lisinopril  (ZESTRIL) 5 MG tablet TAKE 1 TABLET BY MOUTH  DAILY (Patient taking differently: Take 5 mg by mouth daily.) 90 tablet 3  . metFORMIN (GLUCOPHAGE) 1000 MG tablet Take 0.5 tablets (500 mg total) by mouth 2 (two) times daily with a meal. 90 tablet 3  . metoprolol succinate (TOPROL-XL) 25 MG 24 hr tablet Take 1 tablet (25 mg total) by mouth daily. 90 tablet 1  . nitroGLYCERIN (NITROSTAT) 0.4 MG SL tablet Place 1 tablet (0.4 mg total) under the tongue every 5 (five) minutes x 3 doses as needed for chest pain. 25 tablet 2  . ticagrelor (BRILINTA) 90 MG TABS tablet Take 1  tablet (90 mg total) by mouth 2 (two) times daily. 180 tablet 2   No current facility-administered medications for this visit.    There were no vitals filed for this visit.  PHYSICAL EXAM: Cardiac: JVD flat, normal rate and rhythm, clear s1 and s2, no murmurs, rubs or gallops, no LE edema Pulmonary: CTAB, not in distress Abdominal: non distended abdomen, soft and nontender Psych: Alert, conversant, in good spirits  ECG   SR rate 58   ASSESSMENT & PLAN:  Chronic Combined Systolic and Diastolic CHF, Ischemic Cardiomyopathy: -recent NSTEMI 12/2020, s/p PCI to mid LAD -preserved RV function, minimal valvular disease, EF 35-40%, G2DD w/ Moderate LVH -Currently on Lisinopril 66m, metoprolol succinate 293m jardiance [id number] Class I symptoms -d/c lisinopril he will start low dose entresto 48 hrs afterward, he will get a bp cuff and take and record daily readings before our next visit.   -CMP next visit -will get repeat ECHO in 2-3 months -referral already placed for cardiac rehab  CAD:  -s/p recent NSTEMI with PCI to mid LAD   -also some nonobstructive disease in RCA and 2nd marginal -no chest pain symptoms -continue aggressive risk factor management  T2DM: 12/2020 Hgb A1C 8.1 -on Jardiance and metformin -recommend adding ozempic   HLD: -12/2020 Triglycerides 345, LDL 69 -was on atorvastatin 93m70mncreased to 80mg38mring admission, continue -also started on icosapent ethyl 2g BID, has not picked this up yet due to pharmacy supply issues  Obesity: -BMI 31.87 -will need to continue dietary changes and already has increased activity levels -consider adding ozempic  SDOH: -met with our pharmacist today she will work with patient on Entresto affordability and icosapent ethyl supply

## 2020-12-20 NOTE — Research (Signed)
AEGIS V3  Patient here today for infusion 2, doing well, no complaints of chest pains or shortness of breath.  Med changes today by [facility] clinic.                                   "CONSENT"   YES     NO   Continuing further Investigational Product and study visits for follow-up? [x]  []   Continuing consent from future biomedical research [x]  []                                    "EVENTS"    YES     NO  AE   (IF YES SEE SOURCE) []  [x]   SAE  (IF YES SEE SOURCE) []  [x]   ENDPOINT   (IF YES SEE SOURCE) []  [x]   REVASCULARIZATION  (IF YES SEE SOURCE) []  [x]   AMPUTATION   (IF YES SEE SOURCE) []  [x]   TROPONIN'S  (IF YES SEE SOURCE) []  [x]    Current Outpatient Medications  Medication Instructions  . aspirin 81 mg, Oral, Daily  . atorvastatin (LIPITOR) 80 mg, Oral, Daily  . empagliflozin (JARDIANCE) 10 mg, Oral, Daily before breakfast  . Glucose Blood (BLOOD GLUCOSE TEST STRIPS) STRP Please dispense based on patient and insurance preference. Use as directed to monitor FSBS 1x daily. Dx: E11.9.  . icosapent Ethyl (VASCEPA) 2 g, Oral, 2 times daily  . metFORMIN (GLUCOPHAGE) 500 mg, Oral, 2 times daily with meals  . metoprolol succinate (TOPROL-XL) 25 mg, Oral, Daily  . nitroGLYCERIN (NITROSTAT) 0.4 mg, Sublingual, Every 5 min x3 PRN  . sacubitril-valsartan (ENTRESTO) 24-26 MG 1 tablet, Oral, 2 times daily  . ticagrelor (BRILINTA) 90 mg, Oral, 2 times daily

## 2020-12-20 NOTE — Addendum Note (Signed)
Encounter addended by: Remus Loffler, RPH on: 12/20/2020 3:56 PM  Actions taken: Clinical Note Signed

## 2020-12-20 NOTE — Progress Notes (Addendum)
[facility]  Heart Failure Pharmacist Encounter  HPI:  50 yo M with PMH of HTN, HLD, and T2DM who presented to the ED on 12/13/20 with a NSTEMI. He received 1 DES to the mLAD. An ECHO was done on 12/14/20 and LVEF was reduced to 35-40%. He was then discharged on 12/15/20.  Today, James Francis presents to the [facility] for follow up. He denies any shortness of breath, orthopnea/PND, dizziness, or lightheadedness. He is not edematous on exam and has been weighing himself daily. He has been taking his medications as prescribed but says his pharmacy does not have Vascepa available for him. He has followed a low-sodium and fluid restricted diet.   HF Medications: Metoprolol XL 25 mg daily Lisinopril 5 mg daily Jardiance 10 mg daily  Has the patient been experiencing any side effects to the medications prescribed?  Yes - itching (unknown source), no rash or muscle aches  Does the patient have any problems obtaining medications due to transportation or finances?   no  Understanding of regimen: excellent Understanding of indications: excellent Potential of compliance: excellent Patient understands to avoid NSAIDs. Patient understands to avoid decongestants.   Pertinent Lab Values: . Serum creatinine 1.10, BUN 12, Potassium 4.1, Sodium 138, BNP 67.6, Magnesium 1.8  Vital Signs: . Weight: 229 lbs (discharge weight: 235 lbs) . Blood pressure: 98/60 mmHg  . Heart rate: 56 bpm   Medication Assistance / Insurance Benefits Check: Does the patient have prescription insurance?  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 yes  If TOC pharmacy was used, were the refills transferred out to current pharmacy yet? yes  Is the patient willing to transition their outpatient pharmacy to utilize a [facility] outpatient pharmacy with or without mail  order?   No - prefers mail order  Assessment: 1) Chronic systolic CHF (EF 48-54%), due to ICM. NYHA class II symptoms. BP 98/60 mmHg, HR 56. - Continue metoprolol XL 25 mg daily - Stop lisinopril - Start Entresto 24/26 mg BID (educated patient on washout period after stopping lisinopril) - Continue Jardiance 10 mg daily - Consider starting spironolactone at next visit pending BP  Plan: 1) Medication changes: - Stop lisinopril - Start Entresto 24/26 mg BID  2) Patient Assistance: - No prior authorization required for Entresto - Provided patient with Entresto copay card ($10 per fill) - Will help assist with finding a pharmacy with Vascepa (or generic equivalent) in stock  3) Follow up: - Next appointment with HF clinic NP/PA on 01/03/21  Sharen Hones, PharmD, BCPS Heart Failure [facility] Pharmacist [phone]

## 2020-12-21 ENCOUNTER — Telehealth (HOSPITAL_COMMUNITY): Payer: Self-pay

## 2020-12-21 MED FILL — ICOSAPENT ETHYL 1 GM CAPS: 1 | 30 days supply | Qty: 120 | Fill #0

## 2020-12-21 NOTE — Telephone Encounter (Signed)
Vascepa is available at the [facility] Outpatient pharmacy. Patient was not able to have this sent to him through mail order due to backorder issues. However, prior authorization is required for brand name Vascepa.    PA submitted on CoverMyMeds Key [id number] Status is pending   Will continue to follow.

## 2020-12-21 NOTE — Telephone Encounter (Signed)
Vascepa prior authorization was denied. Insurance approves only generic icosapent ethyl with copay $46 per month.  Called patient and LVM to let him know that this would be available for him to pick up tomorrow afternoon. Left callback number if there are any further questions.  Sharen Hones, PharmD, BCPS Heart Failure Stewardship Pharmacist Phone [phone]

## 2020-12-27 ENCOUNTER — Other Ambulatory Visit: Payer: Self-pay

## 2020-12-27 ENCOUNTER — Encounter (HOSPITAL_COMMUNITY)
Admit: 2020-12-27 | Discharge: 2020-12-27 | Disposition: A | Discharge status: Home / Self Care | Payer: 59 | Attending: Internal Medicine | Admitting: Internal Medicine

## 2020-12-27 ENCOUNTER — Encounter: Payer: 59 | Admitting: *Deleted

## 2020-12-27 VITALS — BP 116/70 | HR 56

## 2020-12-27 DIAGNOSIS — Z006 Encounter for examination for normal comparison and control in clinical research program: Secondary | ICD-10-CM

## 2020-12-27 MED ORDER — STUDY - AEGIS II STUDY - PLACEBO OR CSL112 (PI-HILTY)
170.0000 mL | Freq: Once | INTRAVENOUS | Status: AC
  Administered 2020-12-27: 170 mL via INTRAVENOUS
  Filled 2020-12-27: qty 170

## 2020-12-27 NOTE — Research (Signed)
AEGIS V4  Patient doing well, no complaints of chest pain or shortness of breath.                                     "CONSENT"   YES     NO   Continuing further Investigational Product and study visits for follow-up? [x]  []   Continuing consent from future biomedical research [x]  []                                    "EVENTS"    YES     NO  AE   (IF YES SEE SOURCE) []  [x]   SAE  (IF YES SEE SOURCE) []  [x]   ENDPOINT   (IF YES SEE SOURCE) []  [x]   REVASCULARIZATION  (IF YES SEE SOURCE) []  [x]   AMPUTATION   (IF YES SEE SOURCE) []  [x]   TROPONIN'S  (IF YES SEE SOURCE) []  [x]     Current Outpatient Medications:  .  aspirin 81 MG chewable tablet, Chew 1 tablet (81 mg total) by mouth daily., Disp: 90 tablet, Rfl: 1 .  atorvastatin (LIPITOR) 80 MG tablet, Take 1 tablet (80 mg total) by mouth daily., Disp: 90 tablet, Rfl: 1 .  empagliflozin (JARDIANCE) 10 MG TABS tablet, Take 1 tablet (10 mg total) by mouth daily before breakfast., Disp: 30 tablet, Rfl: 2 .  Glucose Blood (BLOOD GLUCOSE TEST STRIPS) STRP, Please dispense based on patient and insurance preference. 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  icosapent Ethyl (VASCEPA) 1 g capsule, Take 1 g by mouth 2 (two) times daily., Disp: , Rfl:  .  metFORMIN (GLUCOPHAGE) 1000 MG tablet, Take 0.5 tablets (500 mg total) by mouth 2 (two) times daily with a meal., Disp: 90 tablet, Rfl: 3 .  metoprolol succinate (TOPROL-XL) 25 MG 24 hr tablet, Take 1 tablet (25 mg total) by mouth daily., Disp: 90 tablet, Rfl: 1 .  nitroGLYCERIN (NITROSTAT) 0.4 MG SL tablet, Place 1 tablet (0.4 mg total) under the tongue every 5 (five) minutes x 3 doses as needed for chest pain., Disp: 25 tablet, Rfl: 2 .  sacubitril-valsartan (ENTRESTO) 24-26 MG, Take 1 tablet by mouth 2 (two) times daily., Disp: 60 tablet, Rfl: 3 .  spironolactone (ALDACTONE) 25 MG tablet, Take 0.5 tablets (12.5 mg total) by mouth daily., Disp: 45 tablet, Rfl: 3 .   ticagrelor (BRILINTA) 90 MG TABS tablet, Take 1 tablet (90 mg total) by mouth 2 (two) times daily., Disp: 180 tablet, Rfl: 2

## 2020-12-28 ENCOUNTER — Other Ambulatory Visit: Payer: Self-pay

## 2020-12-28 ENCOUNTER — Encounter: Payer: Self-pay | Admitting: Physician Assistant

## 2020-12-28 ENCOUNTER — Ambulatory Visit: Payer: 59 | Admitting: Physician Assistant

## 2020-12-28 VITALS — BP 100/62 | HR 58 | Ht 72.0 in | Wt 222.6 lb

## 2020-12-28 DIAGNOSIS — I2510 Atherosclerotic heart disease of native coronary artery without angina pectoris: Secondary | ICD-10-CM

## 2020-12-28 DIAGNOSIS — I214 Non-ST elevation (NSTEMI) myocardial infarction: Secondary | ICD-10-CM

## 2020-12-28 DIAGNOSIS — E782 Mixed hyperlipidemia: Secondary | ICD-10-CM

## 2020-12-28 DIAGNOSIS — I255 Ischemic cardiomyopathy: Secondary | ICD-10-CM | POA: Diagnosis not present

## 2020-12-28 DIAGNOSIS — Z006 Encounter for examination for normal comparison and control in clinical research program: Secondary | ICD-10-CM | POA: Diagnosis not present

## 2020-12-28 NOTE — Patient Instructions (Signed)
Medication Instructions:  Your physician recommends that you continue on your current medications as directed. Please refer to the Current Medication list given to you today.  *If you need a refill on your cardiac medications before your next appointment, please call your pharmacy*   Lab Work: None ordered  If you have labs (blood work) drawn today and your tests are completely normal, you will receive your results only by: Marland Kitchen MyChart Message (if you have MyChart) OR . A paper copy in the mail If you have any lab test that is abnormal or we need to change your treatment, we will call you to review the results.   Testing/Procedures: None ordered   Follow-Up: At [facility], you and your health needs are our priority.  As part of our continuing mission to provide you with exceptional heart care, we have created designated Provider Care Teams.  These Care Teams include your primary Cardiologist (physician) and Advanced Practice Providers (APPs -  Physician Assistants and Nurse Practitioners) who all work together to provide you with the care you need, when you need it.  We recommend signing up for the patient portal called "MyChart".  Sign up information is provided on this After Visit Summary.  MyChart is used to connect with patients for Virtual Visits (Telemedicine).  Patients are able to view lab/test results, encounter notes, upcoming appointments, etc.  Non-urgent messages can be sent to your provider as well.   To learn more about what you can do with MyChart, go to [email or web address].    Your next appointment:   3-4 month(s)  The format for your next appointment:   In Person  Provider:   Tonny Bollman, MD   Other Instructions

## 2020-12-28 NOTE — Progress Notes (Signed)
Cardiology Office Note:    Date:  12/28/2020   ID:  James Francis, DOB 05/24/1971, MRN [id number]  PCP:  Salley Scarleturham, Kawanta F, MD  [facility] Cardiologist:  Tonny BollmanMichael Cooper, MD  [facility] Electrophysiologist:  None   Chief Complaint: hospital follow up   History of Present Illness:    James Francis is a 50 y.o. male with a hx of hypertension, hyperlipidemia, type 2 diabetes presents for hospital follow up.   Admitted 12/2020 with NSTEMI. Cath with 100% occlusion of the mLAD which was treated with PCI/DESx1. Does have residual disease of 50% dRCA, 50% 2nd OM which will be treated medically. Placed on DAPT with ASA/Brilinta for at least one year. Echo with LVEF of 35-40% and grade II DD. Started on [place]Jardiance and Vascepa.   Patient is here for follow-up.  No complaints.  Went back to work yesterday and did well.  He is an Art gallery managerengineer.  Denies chest pain, shortness of breath, orthopnea, PND, syncope, lower extremity edema, melena.  Compliant with his medication.  Past Medical History:  Diagnosis Date  . Colitis    Indeterminate cause  . Coronary artery disease   . Diabetes mellitus without complication (HCC)   . Hearing loss    High frequency bilateral  . History of broken nose    Multiple- has deviated septum  . History of colon polyps   . Hyperlipidemia   . Hypertension   . Ruptured lumbar disc    L4-L5 as a child    Past Surgical History:  Procedure Laterality Date  . COLONOSCOPY    . CORONARY STENT INTERVENTION N/A 12/14/2020   Procedure: CORONARY STENT INTERVENTION;  Surgeon: Corky CraftsVaranasi, Jayadeep S, MD;  [facility];  Service: Cardiovascular;  Laterality: N/A;  . INTRAVASCULAR ULTRASOUND/IVUS N/A 12/14/2020   Procedure: Intravascular Ultrasound/IVUS;  Surgeon: Corky CraftsVaranasi, Jayadeep S, MD;  [facility];  Service: Cardiovascular;  Laterality: N/A;  . LEFT HEART CATH AND CORONARY ANGIOGRAPHY N/A 12/14/2020   Procedure: LEFT HEART CATH AND CORONARY  ANGIOGRAPHY;  Surgeon: Corky CraftsVaranasi, Jayadeep S, MD;  [facility];  Service: Cardiovascular;  Laterality: N/A;  . NO PAST SURGERIES      Current Medications: Current Meds  Medication Sig  . aspirin 81 MG chewable tablet Chew 1 tablet (81 mg total) by mouth daily.  Marland Kitchen. atorvastatin (LIPITOR) 80 MG tablet Take 1 tablet (80 mg total) by mouth daily.  . empagliflozin (JARDIANCE) 10 MG TABS tablet Take 1 tablet (10 mg total) by mouth daily before breakfast.  . Glucose Blood (BLOOD GLUCOSE TEST STRIPS) STRP Please dispense based on patient and insurance preference. Use as directed to monitor FSBS 1x daily. Dx: E11.9.  . icosapent Ethyl (VASCEPA) 1 g capsule Take 2 capsules (2 g total) by mouth 2 (two) times daily.  . metFORMIN (GLUCOPHAGE) 1000 MG tablet Take 0.5 tablets (500 mg total) by mouth 2 (two) times daily with a meal.  . metoprolol succinate (TOPROL-XL) 25 MG 24 hr tablet Take 1 tablet (25 mg total) by mouth daily.  . nitroGLYCERIN (NITROSTAT) 0.4 MG SL tablet Place 1 tablet (0.4 mg total) under the tongue every 5 (five) minutes x 3 doses as needed for chest pain.  . sacubitril-valsartan (ENTRESTO) 24-26 MG Take 1 tablet by mouth 2 (two) times daily.  . ticagrelor (BRILINTA) 90 MG TABS tablet Take 1 tablet (90 mg total) by mouth 2 (two) times daily.     Allergies:   Patient has no known  allergies.   Social History   Socioeconomic History  . Marital status: Married    Spouse name: Not on file  . Number of children: 2  . Years of education: Not on file  . Highest education level: Not on file  Occupational History  . Occupation: Art gallery manager  Tobacco Use  . Smoking status: Never Smoker  . Smokeless tobacco: Never Used  Vaping Use  . Vaping Use: Never used  Substance and Sexual Activity  . Alcohol use: Yes    Alcohol/week: 0.0 standard drinks    Comment: occasionally   . Drug use: No  . Sexual activity: Not Currently  Other Topics Concern  . Not on file  Social  History Narrative  . Not on file   Social Determinants of Health   Financial Resource Strain: Low Risk   . Difficulty of Paying Living Expenses: Not hard at all  Food Insecurity: No Food Insecurity  . Worried About Programme researcher, broadcasting/film/video in the Last Year: Never true  . Ran Out of Food in the Last Year: Never true  Transportation Needs: No Transportation Needs  . Lack of Transportation (Medical): No  . Lack of Transportation (Non-Medical): No  Physical Activity: Not on file  Stress: Not on file  Social Connections: Not on file     Family History: The patient's family history includes Cancer in his maternal grandmother and mother; Crohn's disease in his paternal aunt; Diabetes in his paternal grandfather; Heart disease in his father and paternal grandfather; Kidney Stones in his mother. There is no history of Colon cancer, Stomach cancer, Rectal cancer, Esophageal cancer, or Liver cancer.    ROS:   Please see the history of present illness.    All other systems reviewed and are negative.  EKGs/Labs/Other Studies Reviewed:    The following studies were reviewed today: Echo: 12/14/20  IMPRESSIONS    1. No left ventricular thrombus. There is evidence of ischemia/infarction  in the mid-LAD artery territory, with more severe involvement of the  anterior septum compared to the anterior wall, suggesting patent proximal  ramus intermedius or first diagonal  artery. Left ventricular ejection fraction, by estimation, is 35 to 40%.  The left ventricle has moderately decreased function. The left ventricle  demonstrates regional wall motion abnormalities (see scoring  diagram/findings for description). There is  moderate concentric left ventricular hypertrophy. Left ventricular  diastolic parameters are consistent with Grade II diastolic dysfunction  (pseudonormalization).  2. Right ventricular systolic function is normal. The right ventricular  size is normal. Tricuspid regurgitation  signal is inadequate for assessing  PA pressure.  3. Left atrial size was mildly dilated.  4. The mitral valve is normal in structure. Trivial mitral valve  regurgitation.  5. The aortic valve is tricuspid. Aortic valve regurgitation is trivial.  No aortic stenosis is present.  6. Aortic dilatation noted. There is mild dilatation of the aortic root,  measuring 39 mm.   Comparison(s): Prior images unable to be directly viewed, comparison made  by report only. The left ventricular function is significantly worse. The  left ventricular wall motion abnormality is new.   Cath: 12/14/20    There is moderate to severe left ventricular systolic dysfunction.  LV end diastolic pressure is mildly elevated.  The left ventricular ejection fraction is 25-35% by visual estimate.  There is no aortic valve stenosis.  Dist RCA lesion is 50% stenosed.  2nd Mrg lesion is 50% stenosed.  Mid LAD lesion is 100% stenosed.  A drug-eluting  stent was successfully placed using a STENT RESOLUTE ONYX 3.5X30.  Post intervention, there is a 0% residual stenosis.  Pain resolved after PCI. Sx were intermittent over the prior three days. Hopefully he will have recovery of LV function with revascularization. Continue aggressive secondary prevention. Medical therapy for LV dysfunction.   Check echo in AM.   Diagnostic Dominance: Right    Intervention      EKG:  EKG is not ordered today.  The ekg ordered today demonstrates sinus bradycardia at rate of 58 bpm, T wave inversion in lead V1 to be 5  Recent Labs: 12/14/2020: ALT 24; B Natriuretic Peptide 67.6; Magnesium 1.8; TSH 1.219 12/15/2020: BUN 12; Creatinine, Ser 1.10; Hemoglobin 14.6; Platelets 215; Potassium 4.1; Sodium 138  Recent Lipid Panel    Component Value Date/Time   CHOL 179 12/14/2020 0341   TRIG 345 (H) 12/14/2020 0341   HDL 44 12/14/2020 0341   CHOLHDL 4.1 12/14/2020 0341   VLDL 69 (H) 12/14/2020 0341   LDLCALC 66  12/14/2020 0341   LDLCALC 66 10/04/2020 0826   Physical Exam:    VS:  BP 100/62   Pulse (!) 58   Ht 6' (1.829 m)   Wt 222 lb 9.6 oz (101 kg)   SpO2 96%   BMI 30.19 kg/m     Wt Readings from Last 3 Encounters:  12/28/20 222 lb 9.6 oz (101 kg)  12/20/20 229 lb (103.9 kg)  12/13/20 235 lb (106.6 kg)     GEN: Well nourished, well developed in no acute distress HEENT: Normal NECK: No JVD; No carotid bruits LYMPHATICS: No lymphadenopathy CARDIAC: RRR, no murmurs, rubs, gallops RESPIRATORY:  Clear to auscultation without rales, wheezing or rhonchi  ABDOMEN: Soft, non-tender, non-distended MUSCULOSKELETAL:  No edema; No deformity  SKIN: Warm and dry NEUROLOGIC:  Alert and oriented x 3 PSYCHIATRIC:  Normal affect   ASSESSMENT AND PLAN:    1. CAD s/p DES to LAD No angina.  Continue dual antiplatelet therapy with aspirin and Brilinta.  Continue statin and beta-blocker.  2. ICM/ chronic combined CHF - Echo with LVEF of 35-40% and grade 2 DD - Started on Entresto by [facility] 2[facility] -Appears euvolemic by exam -Heart failure education given -Continue Toprol-XL, Entresto and Jardiance -Has follow-up in heart failure clinic next week.  Will need to BMP at that time given recent initiation of Entresto.  3. HTN -Blood pressure soft low -No change in therapy today  4. Mixed Lipidemia - 12/14/2020: Cholesterol 179; HDL 44; LDL Cholesterol 66; Triglycerides 345; VLDL 69  -LDL at goal of less than 70 -Enroll in AGES study  -Continue Lipitor and Vascepa  Medication Adjustments/Labs and Tests Ordered: Current medicines are reviewed at length with the patient today.  Concerns regarding medicines are outlined above.  Orders Placed This Encounter  Procedures  . EKG 12-Lead   No orders of the defined types were placed in this encounter.   Patient Instructions  Medication Instructions:  Your physician recommends that you continue on your current medications as directed.  Please refer to the Current Medication list given to you today.  *If you need a refill on your cardiac medications before your next appointment, please call your pharmacy*   Lab Work: None ordered  If you have labs (blood work) drawn today and your tests are completely normal, you will receive your results only by: Marland Kitchen MyChart Message (if you have MyChart) OR . A paper copy in the mail If you have any lab  test that is abnormal or we need to change your treatment, we will call you to review the results.   Testing/Procedures: None ordered   Follow-Up: At [facility], you and your health needs are our priority.  As part of our continuing mission to provide you with exceptional heart care, we have created designated Provider Care Teams.  These Care Teams include your primary Cardiologist (physician) and Advanced Practice Providers (APPs -  Physician Assistants and Nurse Practitioners) who all work together to provide you with the care you need, when you need it.  We recommend signing up for the patient portal called "MyChart".  Sign up information is provided on this After Visit Summary.  MyChart is used to connect with patients for Virtual Visits (Telemedicine).  Patients are able to view lab/test results, encounter notes, upcoming appointments, etc.  Non-urgent messages can be sent to your provider as well.   To learn more about what you can do with MyChart, go to [email or web address].    Your next appointment:   3-4 month(s)  The format for your next appointment:   In Person  Provider:   Tonny Bollman, MD   Other Instructions     Signed, Manson Passey, PA  12/28/2020 2:46 PM    [facility]

## 2020-12-28 NOTE — Research (Signed)
V2 PK values

## 2021-01-01 NOTE — Progress Notes (Signed)
Advanced Heart Failure Consult Note   Referring Physician: Dr. Frances Furbish PCP: Salley Scarlet, MD PCP-Cardiologist: Tonny Bollman, MD  HF Cardiologist: Dr. 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    Primarily managed by his PCP before NSTEMI admission.  No prior cardiac history or admissions. Strong family history of cardiac disease.      12/13/2020 admitted with NSTEMI and taken for LHC. He had 100% mid-LAD--->DES to mid-LAD placed. Non-obstructive CAD in RCA and LCX. Echo EF 35-40%   [facility] post-[facility] and was feeling well. Able to walk 2 miles in neighborhood, no SOB, CP, dizziness. Continued aggressive secondary prevention and medical therapy for LV dysfunction.   Today he presents to establish care in advanced HF Clinic Overall feeling fine. Walking 1 mile a day in neighborhood, 2 miles/day on weekends. Enjoys his road bike. Works as an Acupuncturist. Denies increasing SOB, CP, dizziness, edema, or PND/Orthopnea. Appetite ok. No fever or chills. Weight at home ~215 pounds. Taking all medications.   Cardiac Studies: Echo (1/22): EF 35-40%, Grade II DD, mod LV dysfunction, RV ok. LHC (1/22):   EF 25-30%, moderate LV dysfunction, LVEDP mildly elevated. No AV stenosis.  Dist RCA lesion is 50% stenosed.  2nd Mrg lesion is 50% stenosed.  Mid LAD lesion is 100% stenosed.  DES placed using a STENT RESOLUTE ONYX 3.5X30.  Review of Systems: [y] = yes, [ ]  = no   General: Weight gain [ ] ; Weight loss [ ] ; Anorexia [ ] ; Fatigue [ ] ; Fever [ ] ; Chills [ ] ; Weakness [ ]   Cardiac: Chest pain/pressure [ ] ; Resting SOB [ ] ; Exertional SOB [ ] ; Orthopnea [ ] ; Pedal Edema [ ] ; Palpitations [ ] ; Syncope [ ] ; Presyncope [ ] ; Paroxysmal nocturnal dyspnea[ ]   Pulmonary: Cough [ ] ; Wheezing[ ] ; Hemoptysis[ ] ; Sputum [ ] ;  Snoring [ ]   GI: Vomiting[ ] ; Dysphagia[ ] ; Melena[ ] ; Hematochezia [ ] ; Heartburn[ ] ; Abdominal pain [ ] ; Constipation [ ] ; Diarrhea [ ] ; BRBPR [ ]   GU: Hematuria[ ] ; Dysuria [ ] ; Nocturia[ ]   Vascular: Pain in legs with walking [ ] ; Pain in feet with lying flat [ ] ; Non-healing sores [ ] ; Stroke [ ] ; TIA [ ] ; Slurred speech [ ] ;  Neuro: Headaches[ ] ; Vertigo[ ] ; Seizures[ ] ; Paresthesias[ ] ;Blurred vision [ ] ; Diplopia [ ] ; Vision changes [ ]   Ortho/Skin: Arthritis [ ] ; Joint pain [ ] ; Muscle pain [ ] ; Joint swelling [ ] ; Back Pain [ ] ; Rash [ ]   Psych: Depression[ ] ; Anxiety[ ]   Heme: Bleeding problems [ ] ; Clotting disorders [ ] ; Anemia [ ]   Endocrine: Diabetes [y]; Thyroid dysfunction[ ]   Past Medical History:  Diagnosis Date  . Colitis    Indeterminate cause  . Coronary artery disease   . Diabetes mellitus without complication (HCC)   . Hearing loss    High frequency bilateral  . History of broken nose    Multiple- has deviated septum  . History of colon polyps   . Hyperlipidemia   . Hypertension   . Ruptured lumbar disc    L4-L5 as a child   Current Outpatient Medications  Medication Sig Dispense Refill  . aspirin 81 MG chewable tablet Chew 1 tablet (81 mg total) by mouth daily. 90 tablet 1  . atorvastatin (LIPITOR)  80 MG tablet Take 1 tablet (80 mg total) by mouth daily. 90 tablet 1  . empagliflozin (JARDIANCE) 10 MG TABS tablet Take 1 tablet (10 mg total) by mouth daily before breakfast. 30 tablet 2  . Glucose Blood (BLOOD GLUCOSE TEST STRIPS) STRP Please dispense based on patient and insurance preference. Use as directed to monitor FSBS 1x daily. Dx: E11.9. 100 each 1  . icosapent Ethyl (VASCEPA) 1 g capsule Take 1 g by mouth 2 (two) times daily.    . metFORMIN (GLUCOPHAGE) 1000 MG tablet Take 0.5 tablets (500 mg total) by mouth 2 (two) times daily with a meal. 90 tablet 3  . metoprolol succinate (TOPROL-XL) 25 MG 24 hr tablet Take 1 tablet (25 mg total) by mouth daily. 90  tablet 1  . nitroGLYCERIN (NITROSTAT) 0.4 MG SL tablet Place 1 tablet (0.4 mg total) under the tongue every 5 (five) minutes x 3 doses as needed for chest pain. 25 tablet 2  . sacubitril-valsartan (ENTRESTO) 24-26 MG Take 1 tablet by mouth 2 (two) times daily. 60 tablet 3  . ticagrelor (BRILINTA) 90 MG TABS tablet Take 1 tablet (90 mg total) by mouth 2 (two) times daily. 180 tablet 2   No current facility-administered medications for this encounter.   No Known Allergies   Social History   Socioeconomic History  . Marital status: Married    Spouse name: Not on file  . Number of children: 2  . Years of education: Not on file  . Highest education level: Not on file  Occupational History  . Occupation: Art gallery manager  Tobacco Use  . Smoking status: Never Smoker  . Smokeless tobacco: Never Used  Vaping Use  . Vaping Use: Never used  Substance and Sexual Activity  . Alcohol use: Yes    Alcohol/week: 0.0 standard drinks    Comment: occasionally   . Drug use: No  . Sexual activity: Not Currently  Other Topics Concern  . Not on file  Social History Narrative  . Not on file   Social Determinants of Health   Financial Resource Strain: Low Risk   . Difficulty of Paying Living Expenses: Not hard at all  Food Insecurity: No Food Insecurity  . Worried About Programme researcher, broadcasting/film/video in the Last Year: Never true  . Ran Out of Food in the Last Year: Never true  Transportation Needs: No Transportation Needs  . Lack of Transportation (Medical): No  . Lack of Transportation (Non-Medical): No  Physical Activity: Not on file  Stress: Not on file  Social Connections: Not on file  Intimate Partner Violence: Not At Risk  . Fear of Current or Ex-Partner: No  . Emotionally Abused: No  . Physically Abused: No  . Sexually Abused: No   Family History  Problem Relation Age of Onset  . Cancer Mother        unknown type  . Kidney Stones Mother   . Heart disease Father        MI age 52, triple Bypass/  death age 44  . Cancer Maternal Grandmother        Ovarian  . Heart disease Paternal Grandfather        MI  . Diabetes Paternal Grandfather   . Crohn's disease Paternal Aunt   . Colon cancer Neg Hx   . Stomach cancer Neg Hx   . Rectal cancer Neg Hx   . Esophageal cancer Neg Hx   . Liver cancer Neg Hx    Vitals:  01/03/21 1034  BP: 124/78  Pulse: (!) 57  SpO2: 97%  Weight: 100.2 kg    Wt Readings from Last 3 Encounters:  01/03/21 100.2 kg  01/03/21 97.1 kg  12/28/20 101 kg   PHYSICAL EXAM: General:  NAD. No resp difficulty. HEENT: Normal Neck: Supple. No JVD. Carotids 2+ bilat; no bruits. No lymphadenopathy or thryomegaly appreciated. Cor: PMI nondisplaced. Slow rate & rhythm. No rubs, gallops or murmurs. Lungs: Clear Abdomen: Soft, nontender, nondistended. No hepatosplenomegaly. No bruits or masses. Good bowel sounds. Extremities: No cyanosis, clubbing, rash, edema Neuro: Alert & oriented x 3, cranial nerves grossly intact. Moves all 4 extremities w/o difficulty. Affect pleasant.  ECG (2/15): SB with TWI, 58 bpm (personally reviewed).  ASSESSMENT & PLAN:  1. Chronic Combined Systolic and Diastolic CHF, Ischemic Cardiomyopathy: - Recent NSTEMI 12/2020, s/p PCI to mid LAD. - Echo (1/22): EF 35-40%, G2DD w/ Moderate LVH. - NYHA Class I-II. Volume good today & weight stable. He is not on daily loop diuretics. - Start Spiro 12.5 mg daily. - Continue Entresto 24/26 mg bid. - Continue Jardiance 10 mg daily. - Continue Toprol XL 75 mg daily. - Due to start cardiac rehab. OK to resume bike if HR stays 120-130s. - Repeat echo in 3 months when GDMT optimized (~4/22). - CMET today, repeat BMET 7-10 days.  2. CAD:  - S/p NSTEMI with PCI to mid LAD.  RCA and LCx 50% - No chest pain. - Continue ASA, Brilinta, beta-blocker + statin.  3. HTN - Stable today. - Add Spiro as above. - Says he does not snore, could consider Sleep Study.  4. T2DM: - 12/2020 Hgb A1C 8.1 - On  Jardiance and metformin. - Recommend adding Ozempic; will defer to PCP.  5. HLD: - Triglycerides 345, LDL 69 (2/22). - Continue statin + Vascepa. (has only been taking 1 g bid, instructed to take 2g bid) - Following with Dr. Rennis Golden in Lipid Clinic (participants in [employer] II study).  6. Obesity: - Body mass index is 29.97 kg/m. - Encouraged portion control and increased physical activity. - Consider adding Ozempic  Follow up in 3 weeks with PharmD for further medication titration and in APP clinic in 6 weeks.  Prince Rome, FNP-BC 01/03/21   Patient seen and examined with the above-signed Advanced Practice Provider and/or Housestaff. I personally reviewed laboratory data, imaging studies and relevant notes. I independently examined the patient and formulated the important aspects of the plan. I have edited the note to reflect any of my changes or salient points. I have personally discussed the plan with the patient and/or family.  50 y/o male with DM2, HTN, HL admitted several weeks ago with NSTEMI, Found to have occluded LAD -> PCI with DES. EF 35-40%. [facility] and referred to AHF.   Doing well. NYHA II symptoms. No CP. Tolerating meds  General:  Well appearing. No resp difficulty HEENT: normal Neck: supple. no JVD. Carotids 2+ bilat; no bruits. No lymphadenopathy or thryomegaly appreciated. Cor: PMI nondisplaced. Regular rate & rhythm. No rubs, gallops or murmurs. Lungs: clear Abdomen: obese soft, nontender, nondistended. No hepatosplenomegaly. No bruits or masses. Good bowel sounds. Extremities: no cyanosis, clubbing, rash, edema Neuro: alert & orientedx3, cranial nerves grossly intact. moves all 4 extremities w/o difficulty. Affect pleasant  Agree with adding spiro. Pending Cardiac Rehab, Refer to PharmD for ongoing med titration. Ok to ride exercise bike but keep peak HR < 130 for now. Labs today.   Arvilla Meres, MD  12:28 PM

## 2021-01-03 ENCOUNTER — Ambulatory Visit (HOSPITAL_COMMUNITY)
Admission: RE | Admit: 2021-01-03 | Discharge: 2021-01-03 | Disposition: A | Discharge status: Home / Self Care | Payer: 59 | Source: Ambulatory Visit | Attending: Family Medicine | Admitting: Family Medicine

## 2021-01-03 ENCOUNTER — Other Ambulatory Visit: Payer: Self-pay

## 2021-01-03 ENCOUNTER — Encounter: Payer: 59 | Admitting: *Deleted

## 2021-01-03 ENCOUNTER — Encounter (HOSPITAL_COMMUNITY)
Admit: 2021-01-03 | Discharge: 2021-01-03 | Disposition: A | Discharge status: Home / Self Care | Payer: 59 | Attending: Internal Medicine | Admitting: Internal Medicine

## 2021-01-03 ENCOUNTER — Encounter (HOSPITAL_COMMUNITY): Payer: Self-pay

## 2021-01-03 VITALS — BP 104/62 | HR 57

## 2021-01-03 VITALS — BP 124/78 | HR 57 | Wt 221.0 lb

## 2021-01-03 DIAGNOSIS — I5022 Chronic systolic (congestive) heart failure: Secondary | ICD-10-CM

## 2021-01-03 DIAGNOSIS — Z833 Family history of diabetes mellitus: Secondary | ICD-10-CM | POA: Diagnosis not present

## 2021-01-03 DIAGNOSIS — Z6829 Body mass index (BMI) 29.0-29.9, adult: Secondary | ICD-10-CM | POA: Insufficient documentation

## 2021-01-03 DIAGNOSIS — I110 Hypertensive heart disease with heart failure: Secondary | ICD-10-CM | POA: Insufficient documentation

## 2021-01-03 DIAGNOSIS — I255 Ischemic cardiomyopathy: Secondary | ICD-10-CM | POA: Diagnosis not present

## 2021-01-03 DIAGNOSIS — E785 Hyperlipidemia, unspecified: Secondary | ICD-10-CM | POA: Diagnosis not present

## 2021-01-03 DIAGNOSIS — Z79899 Other long term (current) drug therapy: Secondary | ICD-10-CM | POA: Insufficient documentation

## 2021-01-03 DIAGNOSIS — Z955 Presence of coronary angioplasty implant and graft: Secondary | ICD-10-CM | POA: Insufficient documentation

## 2021-01-03 DIAGNOSIS — Z7984 Long term (current) use of oral hypoglycemic drugs: Secondary | ICD-10-CM | POA: Insufficient documentation

## 2021-01-03 DIAGNOSIS — I252 Old myocardial infarction: Secondary | ICD-10-CM | POA: Insufficient documentation

## 2021-01-03 DIAGNOSIS — Z7982 Long term (current) use of aspirin: Secondary | ICD-10-CM | POA: Diagnosis not present

## 2021-01-03 DIAGNOSIS — I2510 Atherosclerotic heart disease of native coronary artery without angina pectoris: Secondary | ICD-10-CM | POA: Diagnosis not present

## 2021-01-03 DIAGNOSIS — E119 Type 2 diabetes mellitus without complications: Secondary | ICD-10-CM | POA: Insufficient documentation

## 2021-01-03 DIAGNOSIS — E669 Obesity, unspecified: Secondary | ICD-10-CM | POA: Diagnosis not present

## 2021-01-03 DIAGNOSIS — Z8249 Family history of ischemic heart disease and other diseases of the circulatory system: Secondary | ICD-10-CM | POA: Insufficient documentation

## 2021-01-03 DIAGNOSIS — I5042 Chronic combined systolic (congestive) and diastolic (congestive) heart failure: Secondary | ICD-10-CM | POA: Diagnosis not present

## 2021-01-03 DIAGNOSIS — Z006 Encounter for examination for normal comparison and control in clinical research program: Secondary | ICD-10-CM

## 2021-01-03 LAB — COMPREHENSIVE METABOLIC PANEL
ALT: 25 U/L (ref 0–44)
AST: 19 U/L (ref 15–41)
Albumin: 4.1 g/dL (ref 3.5–5.0)
Alkaline Phosphatase: 35 U/L — ABNORMAL LOW (ref 38–126)
Anion gap: 9 (ref 5–15)
BUN: 14 mg/dL (ref 6–20)
CO2: 24 mmol/L (ref 22–32)
Calcium: 9.2 mg/dL (ref 8.9–10.3)
Chloride: 106 mmol/L (ref 98–111)
Creatinine, Ser: 1.18 mg/dL (ref 0.61–1.24)
GFR, Estimated: >60 mL/min (ref >60)
Glucose, Bld: 89 mg/dL (ref 70–99)
Potassium: 4 mmol/L (ref 3.5–5.1)
Sodium: 139 mmol/L (ref 135–145)
Total Bilirubin: 1.2 mg/dL (ref 0.3–1.2)
Total Protein: 6.5 g/dL (ref 6.5–8.1)

## 2021-01-03 MED ORDER — STUDY - AEGIS II STUDY - PLACEBO OR CSL112 (PI-HILTY)
170.0000 mL | Freq: Once | INTRAVENOUS | Status: AC
  Administered 2021-01-03: 170 mL via INTRAVENOUS
  Filled 2021-01-03: qty 170

## 2021-01-03 MED ORDER — SPIRONOLACTONE 25 MG PO TABS: 12.5000 mg | ORAL_TABLET | Freq: Every day | ORAL | 3 refills | Status: DC

## 2021-01-03 NOTE — Patient Instructions (Signed)
START Spironolactone 12.5 mg one half tab daily  Labs today We will only contact you if something comes back abnormal or we need to make some changes. Otherwise no news is good news!  Labs needed in 7-10 days  Your physician recommends that you schedule a follow-up appointment in: 3 weeks with the pharmacy team and 6 weeks  in the Advanced Practitioners (PA/NP) Clinic   Do the following things EVERYDAY: 1) Weigh yourself in the morning before breakfast. Write it down and keep it in a log. 2) Take your medicines as prescribed 3) Eat low salt foods--Limit salt (sodium) to 2000 mg per day.  4) Stay as active as you can everyday 5) Limit all fluids for the day to less than 2 liters  At the Advanced [facility] Clinic, you and your health needs are our priority. As part of our continuing mission to provide you with exceptional heart care, we have created designated Provider Care Teams. These Care Teams include your primary Cardiologist (physician) and Advanced Practice Providers (APPs- Physician Assistants and Nurse Practitioners) who all work together to provide you with the care you need, when you need it.   You may see any of the following providers on your designated Care Team at your next follow up: Marland Kitchen Dr Arvilla Meres . Dr Marca Ancona . Dr Thornell Mule . Tonye Becket, NP . Robbie Lis, PA . Shanda Bumps [place],NP . Karle Plumber, PharmD   Please be sure to bring in all your medications bottles to every appointment.    If you have any questions or concerns before your next appointment please send [place] a message through [employer] or call our office at [phone].    TO LEAVE A MESSAGE FOR THE NURSE SELECT OPTION 2, PLEASE LEAVE A MESSAGE INCLUDING: . YOUR NAME . DATE OF BIRTH . CALL BACK NUMBER . REASON FOR CALL**this is important as we prioritize the call backs  YOU WILL RECEIVE A CALL BACK THE SAME DAY AS LONG AS YOU CALL BEFORE 4:00 PM

## 2021-01-04 NOTE — Research (Signed)
AEGIS V5  Patient doing well, no complaints of chest pains or shortness of breath. No med changes.                                    "CONSENT"   YES     NO   Continuing further Investigational Product and study visits for follow-up? [x]  []   Continuing consent from future biomedical research [x]  []                                    "EVENTS"    YES     NO  AE   (IF YES SEE SOURCE) []  [x]   SAE  (IF YES SEE SOURCE) []  [x]   ENDPOINT   (IF YES SEE SOURCE) []  [x]   REVASCULARIZATION  (IF YES SEE SOURCE) []  [x]   AMPUTATION   (IF YES SEE SOURCE) []  [x]   TROPONIN'S  (IF YES SEE SOURCE) []  [x]    PK drawn at this visit:     Current Outpatient Medications:  .  aspirin 81 MG chewable tablet, Chew 1 tablet (81 mg total) by mouth daily., Disp: 90 tablet, Rfl: 1 .  atorvastatin (LIPITOR) 80 MG tablet, Take 1 tablet (80 mg total) by mouth daily., Disp: 90 tablet, Rfl: 1 .  empagliflozin (JARDIANCE) 10 MG TABS tablet, Take 1 tablet (10 mg total) by mouth daily before breakfast., Disp: 30 tablet, Rfl: 2 .  Glucose Blood (BLOOD GLUCOSE TEST STRIPS) STRP, Please dispense based on patient and insurance preference. 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  icosapent Ethyl (VASCEPA) 1 g capsule, Take 1 g by mouth 2 (two) times daily., Disp: , Rfl:  .  metFORMIN (GLUCOPHAGE) 1000 MG tablet, Take 0.5 tablets (500 mg total) by mouth 2 (two) times daily with a meal., Disp: 90 tablet, Rfl: 3 .  metoprolol succinate (TOPROL-XL) 25 MG 24 hr tablet, Take 1 tablet (25 mg total) by mouth daily., Disp: 90 tablet, Rfl: 1 .  nitroGLYCERIN (NITROSTAT) 0.4 MG SL tablet, Place 1 tablet (0.4 mg total) under the tongue every 5 (five) minutes x 3 doses as needed for chest pain., Disp: 25 tablet, Rfl: 2 .  sacubitril-valsartan (ENTRESTO) 24-26 MG, Take 1 tablet by mouth 2 (two) times daily., Disp: 60 tablet, Rfl: 3 .  spironolactone (ALDACTONE) 25 MG tablet, Take 0.5 tablets (12.5 mg total)  by mouth daily., Disp: 45 tablet, Rfl: 3 .  ticagrelor (BRILINTA) 90 MG TABS tablet, Take 1 tablet (90 mg total) by mouth 2 (two) times daily., Disp: 180 tablet, Rfl: 2

## 2021-01-06 NOTE — Telephone Encounter (Signed)
Called patient to see if he was interested in participating in the Cardiac Rehab Program. Patient stated yes. Patient will come in for orientation on 02/22/21 @ 830AM and will attend the 715AM exercise class.  Mailed package

## 2021-01-10 ENCOUNTER — Other Ambulatory Visit: Payer: Self-pay

## 2021-01-10 ENCOUNTER — Ambulatory Visit (HOSPITAL_COMMUNITY)
Admission: RE | Admit: 2021-01-10 | Discharge: 2021-01-10 | Disposition: A | Discharge status: Home / Self Care | Payer: 59 | Source: Ambulatory Visit | Attending: Internal Medicine | Admitting: Internal Medicine

## 2021-01-10 ENCOUNTER — Encounter (HOSPITAL_COMMUNITY): Payer: Self-pay

## 2021-01-10 ENCOUNTER — Encounter: Payer: 59 | Admitting: *Deleted

## 2021-01-10 ENCOUNTER — Encounter: Payer: Self-pay | Admitting: Cardiology

## 2021-01-10 ENCOUNTER — Encounter (HOSPITAL_COMMUNITY)
Admission: RE | Admit: 2021-01-10 | Discharge: 2021-01-10 | Disposition: A | Discharge status: Home / Self Care | Payer: 59 | Source: Ambulatory Visit | Attending: Internal Medicine | Admitting: Internal Medicine

## 2021-01-10 VITALS — BP 98/54 | HR 50 | Wt 212.0 lb

## 2021-01-10 DIAGNOSIS — I255 Ischemic cardiomyopathy: Secondary | ICD-10-CM | POA: Diagnosis present

## 2021-01-10 DIAGNOSIS — I2583 Coronary atherosclerosis due to lipid rich plaque: Secondary | ICD-10-CM

## 2021-01-10 DIAGNOSIS — Z006 Encounter for examination for normal comparison and control in clinical research program: Secondary | ICD-10-CM

## 2021-01-10 DIAGNOSIS — I2510 Atherosclerotic heart disease of native coronary artery without angina pectoris: Secondary | ICD-10-CM

## 2021-01-10 LAB — BASIC METABOLIC PANEL
Anion gap: 11 (ref 5–15)
BUN: 14 mg/dL (ref 6–20)
CO2: 20 mmol/L — ABNORMAL LOW (ref 22–32)
Calcium: 9.2 mg/dL (ref 8.9–10.3)
Chloride: 106 mmol/L (ref 98–111)
Creatinine, Ser: 1.07 mg/dL (ref 0.61–1.24)
GFR, Estimated: >60 mL/min (ref >60)
Glucose, Bld: 98 mg/dL (ref 70–99)
Potassium: 4.1 mmol/L (ref 3.5–5.1)
Sodium: 137 mmol/L (ref 135–145)

## 2021-01-10 NOTE — Progress Notes (Signed)
AEGIS-II research study:  Patient seen an examined prior to randomization and infusion.  Physical Exam: General appearance: alert Lungs: clear to auscultation bilaterally Heart: regular rate and rhythm, S1, S2 normal, no murmur, click, rub or gallop Extremities: extremities normal, atraumatic, no cyanosis or edema Skin: Skin color, texture, turgor normal. No rashes or lesions  [place] Class:  Yes, Class I  Doing well since discharge. No complications noted. Following with regular scheduled follow up appts  SignedLaverda Page, NP-C 01/10/2021, 11:23 AM Pager: [phone]

## 2021-01-10 NOTE — Research (Addendum)
AEGIS V6  Patient doing well, no compliants of chest pain or shortness of breath. No med changes.   Geoffry Paradise NP say patient for physical exam see her note for exam.                                   "CONSENT"   YES     NO   Continuing further Investigational Product and study visits for follow-up? [x]  []   Continuing consent from future biomedical research [x]  []                                    "EVENTS"    YES     NO  AE   (IF YES SEE SOURCE) []  [x]   SAE  (IF YES SEE SOURCE) []  [x]   ENDPOINT   (IF YES SEE SOURCE) []  [x]   REVASCULARIZATION  (IF YES SEE SOURCE) []  [x]   AMPUTATION   (IF YES SEE SOURCE) []  [x]   TROPONIN'S  (IF YES SEE SOURCE) []  [x]    Labs drawn today:       Current Outpatient Medications:    aspirin 81 MG chewable tablet, Chew 1 tablet (81 mg total) by mouth daily., Disp: 90 tablet, Rfl: 1   atorvastatin (LIPITOR) 80 MG tablet, Take 1 tablet (80 mg total) by mouth daily., Disp: 90 tablet, Rfl: 1   empagliflozin (JARDIANCE) 10 MG TABS tablet, Take 1 tablet (10 mg total) by mouth daily before breakfast., Disp: 30 tablet, Rfl: 2   Glucose Blood (BLOOD GLUCOSE TEST STRIPS) STRP, Please dispense based on patient and insurance preference. Use as directed to monitor FSBS 1x daily. Dx: E11.9., Disp: 100 each, Rfl: 1   icosapent Ethyl (VASCEPA) 1 g capsule, Take 1 g by mouth 2 (two) times daily., Disp: , Rfl:    metFORMIN (GLUCOPHAGE) 1000 MG tablet, Take 0.5 tablets (500 mg total) by mouth 2 (two) times daily with a meal., Disp: 90 tablet, Rfl: 3   metoprolol succinate (TOPROL-XL) 25 MG 24 hr tablet, Take 1 tablet (25 mg total) by mouth daily., Disp: 90 tablet, Rfl: 1   nitroGLYCERIN (NITROSTAT) 0.4 MG SL tablet, Place 1 tablet (0.4 mg total) under the tongue every 5 (five) minutes x 3 doses as needed for chest pain., Disp: 25 tablet, Rfl: 2   sacubitril-valsartan (ENTRESTO) 24-26 MG, Take 1 tablet by mouth 2 (two) times daily., Disp: 60 tablet, Rfl: 3    spironolactone (ALDACTONE) 25 MG tablet, Take 0.5 tablets (12.5 mg total) by mouth daily., Disp: 45 tablet, Rfl: 3   ticagrelor (BRILINTA) 90 MG TABS tablet, Take 1 tablet (90 mg total) by mouth 2 (two) times daily., Disp: 180 tablet, Rfl: 2

## 2021-01-23 NOTE — Progress Notes (Incomplete)
***In Progress*** Referring Physician: Dr. Frances Furbish PCP: Salley Scarlet, MD PCP-Cardiologist: Tonny Bollman, MD  HF Cardiologist: Dr. 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 Primarily managed by his PCP before NSTEMI admission.  No prior cardiac history or admissions. Strong family history of cardiac disease.      He was admitted on 12/13/20 with NSTEMI and taken for LHC. He had 100% mid-LAD, DES to mid-LAD placed. Non-obstructive CAD in RCA and LCX. Echo with EF 35-40%, RV normal.   He was [facility] in [facility] post-discharge and was feeling well. Was able to walk 2 miles in neighborhood, no SOB, CP, dizziness. Continued aggressive secondary prevention and medical therapy for LV dysfunction.   He recently presented for follow-up with Dr. Gala Romney on 01/03/21. Overall was feeling fine. Was walking 1 mile/day in neighborhood, 2 miles/day on weekends. Enjoyed his road bike. Works as an Acupuncturist. Denied increasing SOB, CP, dizziness, edema, or PND/Orthopnea. Appetite was ok. Weight at home ~215 lbs.   Today he returns to HF clinic for pharmacist medication titration. At last visit with MD, spironolactone 12.5 mg daily was started.   124/78, 57, 221 lbs - no bmet needed - 2/28: 98/54 at [address], HR 50 - trend 100-110s/60-70s, HR 50-60s A) Increase spiro 25 B) Increase metop (25>50 or 75>100) C) If BP up, can increase Entresto 49/51 F/u: [facility] 4/4  Overall feeling ***. Dizziness, lightheadedness, fatigue:  Chest pain or palpitations:  How is your breathing?: *** SOB: Able to complete all ADLs. Activity level ***  Weight at home pounds. Takes furosemide/torsemide/bumex *** mg *** daily.  LEE PND/Orthopnea  Appetite *** Low-salt diet:   Physical Exam Cost/affordability of meds   4/4 APP HF Medications: Metoprolol  succinate 25 mg daily Entresto 24/26 mg BID Spironolactone 12.5 mg daily Jardiance 10 mg daily  Has the patient been experiencing any side effects to the medications prescribed?  {YES NO:22349}  Does the patient have any problems obtaining medications due to transportation or finances?   {YES ZO:10960} - Has [facility] of regimen: {excellent/good/fair/poor:19665} Understanding of indications: {excellent/good/fair/poor:19665} Potential of compliance: {excellent/good/fair/poor:19665} Patient understands to avoid NSAIDs. Patient understands to avoid decongestants.    Pertinent Lab Values: . 01/10/21: Serum creatinine 1.07, BUN 14, Potassium 4.1, Sodium 137  Vital Signs: . Weight: *** (last clinic weight: 221 lbs) . Blood pressure: ***  . Heart rate: ***   Assessment/Plan: 1. Chronic Combined Systolic and Diastolic CHF, Ischemic Cardiomyopathy: - Recent NSTEMI 12/2020, s/p PCI to mid LAD. - Echo (1/22): EF 35-40%, G2DD w/ Moderate LVH. - NYHA Class I-II. Volume good today & weight stable. He is not on daily loop diuretics. - Continue metoprolol succinate 25 mg daily ??? (DB note said 75) - Continue Entresto 24/26 mg BID - Continue spironolactone 12.5 mg daily - Continue Jardiance 10 mg daily. - Due to start cardiac rehab. OK to resume bike if HR stays 120-130s. - Plans note to repeat echo in 3 months when GDMT optimized (~4/22). - Follow up ***  2. CAD:  - S/p NSTEMI with PCI to mid LAD.  RCA and LCx 50% - No chest pain. - Continue ASA, Brilinta, beta-blocker + statin.  3. HTN - Stable today. - Continue spironolactone, Entresto, metoprolol succinate as above - Says he does not snore, could consider Sleep Study.  4. T2DM: - 12/2020 Hgb A1C 8.1 - On Jardiance and metformin. - Previously recommended adding Ozempic; deferred to PCP.  5. HLD: - Triglycerides 345, LDL 69 (2/22). - Continue statin + Vascepa. (has only been taking 1 g bid,  instructed to take 2g bid) - Following with Dr. Rennis Golden in Lipid Clinic (participates in Aegis II study).  6. Obesity: - Body mass index is 29.97 kg/m. - Encouraged portion control and increased physical activity. - Consider adding Ozempic  [name], PharmD, BCPS PGY2 Cardiology Pharmacy Resident  Karle Plumber, PharmD, BCPS, [facility], CPP Heart Failure Clinic Pharmacist [phone]

## 2021-01-24 ENCOUNTER — Other Ambulatory Visit: Payer: Self-pay

## 2021-01-24 ENCOUNTER — Ambulatory Visit (HOSPITAL_COMMUNITY)
Admission: RE | Admit: 2021-01-24 | Discharge: 2021-01-24 | Disposition: A | Discharge status: Home / Self Care | Payer: 59 | Source: Ambulatory Visit | Attending: Internal Medicine | Admitting: Internal Medicine

## 2021-01-24 VITALS — BP 116/62 | HR 60 | Wt 217.8 lb

## 2021-01-24 DIAGNOSIS — I252 Old myocardial infarction: Secondary | ICD-10-CM | POA: Diagnosis not present

## 2021-01-24 DIAGNOSIS — E785 Hyperlipidemia, unspecified: Secondary | ICD-10-CM | POA: Diagnosis not present

## 2021-01-24 DIAGNOSIS — E669 Obesity, unspecified: Secondary | ICD-10-CM | POA: Diagnosis not present

## 2021-01-24 DIAGNOSIS — I255 Ischemic cardiomyopathy: Secondary | ICD-10-CM | POA: Insufficient documentation

## 2021-01-24 DIAGNOSIS — Z7901 Long term (current) use of anticoagulants: Secondary | ICD-10-CM | POA: Diagnosis not present

## 2021-01-24 DIAGNOSIS — I2510 Atherosclerotic heart disease of native coronary artery without angina pectoris: Secondary | ICD-10-CM | POA: Diagnosis not present

## 2021-01-24 DIAGNOSIS — I110 Hypertensive heart disease with heart failure: Secondary | ICD-10-CM | POA: Insufficient documentation

## 2021-01-24 DIAGNOSIS — Z79899 Other long term (current) drug therapy: Secondary | ICD-10-CM | POA: Diagnosis not present

## 2021-01-24 DIAGNOSIS — Z6829 Body mass index (BMI) 29.0-29.9, adult: Secondary | ICD-10-CM | POA: Insufficient documentation

## 2021-01-24 DIAGNOSIS — I5042 Chronic combined systolic (congestive) and diastolic (congestive) heart failure: Secondary | ICD-10-CM | POA: Diagnosis not present

## 2021-01-24 MED ORDER — SPIRONOLACTONE 25 MG PO TABS: 25.0000 mg | ORAL_TABLET | Freq: Every day | ORAL | 11 refills | Status: DC

## 2021-01-24 NOTE — Patient Instructions (Signed)
It was a pleasure seeing you today!  MEDICATIONS: -We are changing your medications today -Increase spironolactone to 25 mg (one tablet) daily -Call if you have questions about your medications.  LABS: -Will check labs on Wednesday, 3/23 at 3:45 pm  NEXT APPOINTMENT: Return to clinic in 3 weeks with [facility].  In general, to take care of your heart failure: -Limit your fluid intake to 2 Liters (half-gallon) per day.   -Limit your salt intake to ideally 2-3 grams (2000-3000 mg) per day. -Weigh yourself daily and record, and bring that "weight diary" to your next appointment.  (Weight gain of 2-3 pounds in 1 day typically means fluid weight.) -The medications for your heart are to help your heart and help you live longer.   -Please contact us before stopping any of your heart medications.  Call the clinic at [phone] with questions or to reschedule future appointments.

## 2021-01-24 NOTE — Progress Notes (Signed)
Referring Physician: Dr. Frances Furbish PCP: Salley Scarlet, MD PCP-Cardiologist: Tonny Bollman, MD  HF Cardiologist: Dr. 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 Primarily managed by his PCP before NSTEMI admission.  No prior cardiac history or admissions. Strong family history of cardiac disease.      He was admitted on 12/13/20 with NSTEMI and taken for LHC. He had 100% mid-LAD, DES to mid-LAD placed. Non-obstructive CAD in RCA and LCX. Echo with EF 35-40%, RV normal.   He was seen in [facility] post-discharge and was feeling well. Was able to walk 2 miles in neighborhood, no SOB, CP, dizziness. Continued aggressive secondary prevention and medical therapy for LV dysfunction.   He recently presented for follow-up with Dr. Gala Romney on 01/03/21. Overall was feeling fine. Was walking 1 mile/day in neighborhood, 2 miles/day on weekends. Enjoyed his road bike. Works as an Acupuncturist. Denied increasing SOB, CP, dizziness, edema, or PND/Orthopnea. Appetite was ok. Weight at home ~215 lbs.   Today he returns to HF clinic for pharmacist medication titration. At last visit with MD, spironolactone 12.5 mg daily was started. Overall feeling well today. BP stable around 106/70s at home. No dizziness, lightheadedness, or fatigue. No chest pain or palpitations. Breathing is great, able to walk 2 miles before feeling SOB. Weight down 4 lbs in clinic today. No LEE, PND/Orthopnea. Appetite is good, adheres to low-salt diet. Taking all medications as prescribed and tolerating them well.  HF Medications: Metoprolol succinate 25 mg daily Entresto 24/26 mg BID Spironolactone 12.5 mg daily Jardiance 10 mg daily  Has the patient been experiencing any side effects to the medications prescribed?  no  Does the patient have any problems obtaining medications due to  transportation or finances?   no - Has [facility] of regimen: good Understanding of indications: good Potential of compliance: good Patient understands to avoid NSAIDs. Patient understands to avoid decongestants.    Pertinent Lab Values: . 01/10/21: Serum creatinine 1.07, BUN 14, Potassium 4.1, Sodium 137  Vital Signs: . Weight: 217.8 lbs (last clinic weight: 221 lbs) . Blood pressure: 116/62  . Heart rate: 60   Assessment/Plan: 1. Chronic Combined Systolic and Diastolic CHF, Ischemic Cardiomyopathy: - Recent NSTEMI 12/2020, s/p PCI to mid LAD. - Echo (1/22): EF 35-40%, G2DD w/ Moderate LVH. - NYHA Class I-II. Volume good today & weight stable. He is not on daily loop diuretics. - Continue metoprolol succinate 25 mg daily - Continue Entresto 24/26 mg BID - Increase spironolactone to 25 mg daily. Check BMET on 02/02/21. - Continue Jardiance 10 mg daily. - Due to start cardiac rehab. OK to resume bike if HR stays 120-130s. - Plans noted to repeat echo in 3 months when GDMT optimized (~4/22). - Follow up in [facility] in 3 weeks on 02/14/21.  2. CAD:  - S/p NSTEMI with PCI to mid LAD.  RCA and LCx 50% - No chest pain. - Continue ASA, Brilinta, beta-blocker + statin.  3. HTN - Stable today. - Increase spironolactone to 25 mg daily as above. - Continue Entresto, metoprolol succinate as above - Says he does not snore, could consider Sleep Study.  4. T2DM: - 12/2020 Hgb A1C 8.1 - On Jardiance and metformin. - Previously recommended adding Ozempic; deferred to PCP.  5. HLD: - Triglycerides 345, LDL 69 (2/22). - Continue  statin + Vascepa. - Following with Dr. Rennis Golden in Lipid Clinic (participates in Aegis II study).  6. Obesity: - Body mass index is 29.97 kg/m. - Encouraged portion control and increased physical activity. - Consider adding Ozempic  [name], PharmD, BCPS PGY2 Cardiology Pharmacy Resident  Karle Plumber, PharmD, BCPS,  [facility], CPP Heart Failure Clinic Pharmacist [phone]

## 2021-02-02 ENCOUNTER — Other Ambulatory Visit: Payer: Self-pay

## 2021-02-02 ENCOUNTER — Ambulatory Visit (HOSPITAL_COMMUNITY)
Admission: RE | Admit: 2021-02-02 | Discharge: 2021-02-02 | Disposition: A | Discharge status: Home / Self Care | Payer: 59 | Source: Ambulatory Visit | Attending: Internal Medicine | Admitting: Internal Medicine

## 2021-02-02 DIAGNOSIS — I255 Ischemic cardiomyopathy: Secondary | ICD-10-CM

## 2021-02-02 LAB — BASIC METABOLIC PANEL
Anion gap: 6 (ref 5–15)
BUN: 21 mg/dL — ABNORMAL HIGH (ref 6–20)
CO2: 23 mmol/L (ref 22–32)
Calcium: 9.6 mg/dL (ref 8.9–10.3)
Chloride: 110 mmol/L (ref 98–111)
Creatinine, Ser: 1.06 mg/dL (ref 0.61–1.24)
GFR, Estimated: >60 mL/min (ref >60)
Glucose, Bld: 84 mg/dL (ref 70–99)
Potassium: 4 mmol/L (ref 3.5–5.1)
Sodium: 139 mmol/L (ref 135–145)

## 2021-02-12 ENCOUNTER — Other Ambulatory Visit (HOSPITAL_COMMUNITY): Payer: Self-pay

## 2021-02-14 ENCOUNTER — Encounter (HOSPITAL_COMMUNITY): Payer: 59

## 2021-02-17 ENCOUNTER — Telehealth (HOSPITAL_COMMUNITY): Payer: Self-pay

## 2021-02-18 ENCOUNTER — Telehealth (HOSPITAL_COMMUNITY): Payer: Self-pay

## 2021-02-21 ENCOUNTER — Other Ambulatory Visit: Payer: Self-pay

## 2021-02-21 ENCOUNTER — Encounter: Payer: Self-pay | Admitting: *Deleted

## 2021-02-21 ENCOUNTER — Ambulatory Visit (HOSPITAL_COMMUNITY)
Admission: RE | Admit: 2021-02-21 | Discharge: 2021-02-21 | Disposition: A | Discharge status: Home / Self Care | Payer: 59 | Source: Ambulatory Visit | Attending: Cardiology | Admitting: Cardiology

## 2021-02-21 ENCOUNTER — Encounter (HOSPITAL_COMMUNITY): Payer: Self-pay

## 2021-02-21 VITALS — BP 108/70 | HR 60 | Wt 213.8 lb

## 2021-02-21 DIAGNOSIS — E8881 Metabolic syndrome: Secondary | ICD-10-CM | POA: Diagnosis not present

## 2021-02-21 DIAGNOSIS — I2510 Atherosclerotic heart disease of native coronary artery without angina pectoris: Secondary | ICD-10-CM

## 2021-02-21 DIAGNOSIS — E669 Obesity, unspecified: Secondary | ICD-10-CM | POA: Insufficient documentation

## 2021-02-21 DIAGNOSIS — Z7984 Long term (current) use of oral hypoglycemic drugs: Secondary | ICD-10-CM | POA: Diagnosis not present

## 2021-02-21 DIAGNOSIS — I255 Ischemic cardiomyopathy: Secondary | ICD-10-CM

## 2021-02-21 DIAGNOSIS — E785 Hyperlipidemia, unspecified: Secondary | ICD-10-CM | POA: Insufficient documentation

## 2021-02-21 DIAGNOSIS — Z7982 Long term (current) use of aspirin: Secondary | ICD-10-CM | POA: Diagnosis not present

## 2021-02-21 DIAGNOSIS — Z8249 Family history of ischemic heart disease and other diseases of the circulatory system: Secondary | ICD-10-CM | POA: Diagnosis not present

## 2021-02-21 DIAGNOSIS — E119 Type 2 diabetes mellitus without complications: Secondary | ICD-10-CM | POA: Insufficient documentation

## 2021-02-21 DIAGNOSIS — Z9861 Coronary angioplasty status: Secondary | ICD-10-CM | POA: Diagnosis not present

## 2021-02-21 DIAGNOSIS — I252 Old myocardial infarction: Secondary | ICD-10-CM | POA: Insufficient documentation

## 2021-02-21 DIAGNOSIS — Z955 Presence of coronary angioplasty implant and graft: Secondary | ICD-10-CM | POA: Diagnosis not present

## 2021-02-21 DIAGNOSIS — I110 Hypertensive heart disease with heart failure: Secondary | ICD-10-CM | POA: Insufficient documentation

## 2021-02-21 DIAGNOSIS — I5042 Chronic combined systolic (congestive) and diastolic (congestive) heart failure: Secondary | ICD-10-CM | POA: Insufficient documentation

## 2021-02-21 DIAGNOSIS — I5022 Chronic systolic (congestive) heart failure: Secondary | ICD-10-CM | POA: Diagnosis not present

## 2021-02-21 DIAGNOSIS — Z79899 Other long term (current) drug therapy: Secondary | ICD-10-CM | POA: Diagnosis not present

## 2021-02-21 DIAGNOSIS — Z006 Encounter for examination for normal comparison and control in clinical research program: Secondary | ICD-10-CM

## 2021-02-21 LAB — BASIC METABOLIC PANEL
Anion gap: 8 (ref 5–15)
BUN: 18 mg/dL (ref 6–20)
CO2: 23 mmol/L (ref 22–32)
Calcium: 9.2 mg/dL (ref 8.9–10.3)
Chloride: 105 mmol/L (ref 98–111)
Creatinine, Ser: 1.04 mg/dL (ref 0.61–1.24)
GFR, Estimated: >60 mL/min (ref >60)
Glucose, Bld: 96 mg/dL (ref 70–99)
Potassium: 4.5 mmol/L (ref 3.5–5.1)
Sodium: 136 mmol/L (ref 135–145)

## 2021-02-21 MED ORDER — ENTRESTO 49-51 MG PO TABS: 1.0000 | ORAL_TABLET | Freq: Two times a day (BID) | ORAL | 6 refills | Status: DC

## 2021-02-21 NOTE — Addendum Note (Signed)
Encounter addended by: Theresia Bough, CMA on: 02/21/2021 9:29 AM  Actions taken: Order list changed, Diagnosis association updated

## 2021-02-21 NOTE — Progress Notes (Signed)
Advanced Heart Failure Clinic Note   PCP: Salley Scarlet, MD PCP-Cardiologist: Tonny Bollman, MD  HF Cardiologist: Dr. 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    Primarily managed by his PCP before NSTEMI admission.  No prior cardiac history or admissions. Strong family history of cardiac disease.      12/13/2020 admitted with NSTEMI and taken for LHC. He had 100% mid-LAD--->DES to mid-LAD placed. Non-obstructive CAD in RCA and LCX. Echo EF 35-40%   He was seen in the [facility] post-discharge and was feeling well. Able to walk 2 miles in neighborhood, no SOB, CP, dizziness. Continued aggressive secondary prevention and medical therapy for LV dysfunction. Was referred to the [facility] for further management. We have been gradually titrating meds. Last clinic visit, 3/14, spiro was increased to 25 mg daily.   He returns today for f/u. Feels well. No complaints.  Riding bike. NYHA I-II. Denies CP. No LEE, orthopnea, PND, palpitations, syncope/ near syncope. Reports full med compliance. SBPs at home ~110. No orthostatic symptoms w/ meds. BP today 108/70. Took AM meds ~3 hrs ago.   Cardiac Studies:  Echo (1/22): EF 35-40%, Grade II DD, mod LV dysfunction, RV ok. LHC (1/22):   EF 25-30%, moderate LV dysfunction, LVEDP mildly elevated. No AV stenosis.  Dist RCA lesion is 50% stenosed.  2nd Mrg lesion is 50% stenosed.  Mid LAD lesion is 100% stenosed.  DES placed using a STENT RESOLUTE ONYX 3.5X30.  Review of systems complete and found to be negative unless listed in HPI.     Past Medical History:  Diagnosis Date  . Colitis    Indeterminate cause  . Coronary artery disease   . Diabetes mellitus without complication (HCC)   . Hearing loss    High frequency bilateral  .  History of broken nose    Multiple- has deviated septum  . History of colon polyps   . Hyperlipidemia   . Hypertension   . Ruptured lumbar disc    L4-L5 as a child   Current Outpatient Medications  Medication Sig Dispense Refill  . aspirin 81 MG chewable tablet Chew 1 tablet (81 mg total) by mouth daily. 90 tablet 1  . atorvastatin (LIPITOR) 80 MG tablet Take 1 tablet (80 mg total) by mouth daily. 90 tablet 1  . empagliflozin (JARDIANCE) 10 MG TABS tablet Take 1 tablet (10 mg total) by mouth daily before breakfast. 30 tablet 2  . Glucose Blood (BLOOD GLUCOSE TEST STRIPS) STRP Please dispense based on patient and insurance preference. Use as directed to monitor FSBS 1x daily. Dx: E11.9. 100 each 1  . icosapent Ethyl (VASCEPA) 1 g capsule Take 1 g by mouth 2 (two) times daily.    . metFORMIN (GLUCOPHAGE) 1000 MG tablet Take 0.5 tablets (500 mg total) by mouth 2 (two) times daily with a meal. 90 tablet 3  . metoprolol succinate (TOPROL-XL) 25 MG 24 hr tablet Take 1 tablet (25 mg total) by mouth daily. 90 tablet 1  . nitroGLYCERIN (NITROSTAT) 0.4 MG SL tablet Place 1 tablet (0.4 mg total) under the tongue every 5 (five) minutes x 3 doses as needed for chest pain. 25 tablet 2  . sacubitril-valsartan (ENTRESTO) 24-26 MG Take 1 tablet by mouth 2 (two) times daily.  60 tablet 3  . spironolactone (ALDACTONE) 25 MG tablet Take 1 tablet (25 mg total) by mouth daily. 30 tablet 11  . ticagrelor (BRILINTA) 90 MG TABS tablet Take 1 tablet (90 mg total) by mouth 2 (two) times daily. 180 tablet 2   No current facility-administered medications for this encounter.   No Known Allergies   Social History   Socioeconomic History  . Marital status: Married    Spouse name: Not on file  . Number of children: 2  . Years of education: Not on file  . Highest education level: Not on file  Occupational History  . Occupation: Art gallery manager  Tobacco Use  . Smoking status: Never Smoker  . Smokeless tobacco: Never Used   Vaping Use  . Vaping Use: Never used  Substance and Sexual Activity  . Alcohol use: Yes    Alcohol/week: 0.0 standard drinks    Comment: occasionally   . Drug use: No  . Sexual activity: Not Currently  Other Topics Concern  . Not on file  Social History Narrative  . Not on file   Social Determinants of Health   Financial Resource Strain: Low Risk   . Difficulty of Paying Living Expenses: Not hard at all  Food Insecurity: No Food Insecurity  . Worried About Programme researcher, broadcasting/film/video in the Last Year: Never true  . Ran Out of Food in the Last Year: Never true  Transportation Needs: No Transportation Needs  . Lack of Transportation (Medical): No  . Lack of Transportation (Non-Medical): No  Physical Activity: Not on file  Stress: Not on file  Social Connections: Not on file  Intimate Partner Violence: Not At Risk  . Fear of Current or Ex-Partner: No  . Emotionally Abused: No  . Physically Abused: No  . Sexually Abused: No   Family History  Problem Relation Age of Onset  . Cancer Mother        unknown type  . Kidney Stones Mother   . Heart disease Father        MI age 23, triple Bypass/ death age 71  . Cancer Maternal Grandmother        Ovarian  . Heart disease Paternal Grandfather        MI  . Diabetes Paternal Grandfather   . Crohn's disease Paternal Aunt   . Colon cancer Neg Hx   . Stomach cancer Neg Hx   . Rectal cancer Neg Hx   . Esophageal cancer Neg Hx   . Liver cancer Neg Hx    Vitals:   02/21/21 0905  BP: 108/70  Pulse: 60  SpO2: 99%  Weight: 97 kg (213 lb 12.8 oz)    Wt Readings from Last 3 Encounters:  02/21/21 97 kg (213 lb 12.8 oz)  01/24/21 98.8 kg (217 lb 12.8 oz)  01/10/21 96.2 kg (212 lb)   PHYSICAL EXAM: General:  Well appearing middle aged WM. No respiratory difficulty HEENT: normal Neck: supple. no JVD. Carotids 2+ bilat; no bruits. No lymphadenopathy or thyromegaly appreciated. Cor: PMI nondisplaced. Regular rate & rhythm. No rubs,  gallops or murmurs. Lungs: clear Abdomen: soft, nontender, nondistended. No hepatosplenomegaly. No bruits or masses. Good bowel sounds. Extremities: no cyanosis, clubbing, rash, edema Neuro: alert & oriented x 3, cranial nerves grossly intact. moves all 4 extremities w/o difficulty. Affect pleasant.   ECG: not performed.   ASSESSMENT & PLAN:  1. Chronic Combined Systolic and Diastolic CHF, Ischemic Cardiomyopathy: - Recent NSTEMI 12/2020, s/p PCI to mid LAD. -  Echo (1/22): EF 35-40%, G2DD w/ Moderate LVH. - NYHA Class I-II. Volume status good. Euvolemic. No loop diuretic requirements - Continue Spiro 25 mg daily. - Increase Entresto to 49/51 mg bid. Advised to notify if development of orthostatic symptoms - Continue Jardiance 10 mg daily. - Continue Toprol XL 75 mg daily.  - Continue Cardiac Rehab OK to resume bike if HR stays 120-130s. - Repeat echo in 3 months when GDMT fully optimized - BMP today and again in 7 days   2. CAD:  - S/p NSTEMI with PCI to mid LAD.  RCA and LCx 50% - denies anginal symptoms  - Continue ASA, Brilinta, beta-blocker + statin. - Cardiac Rehab   3. HTN  - Stable today - Continue meds per above  - advised to monitor closely w/ Etnresto increase - BMP today + 7 days   4. T2DM: - 12/2020 Hgb A1C 8.1 - On Jardiance and metformin. - Recommend adding Ozempic; will defer to PCP.  5. HLD: - Triglycerides 345, LDL 69 (2/22). - Continue statin + Vascepa. - Following with Dr. Rennis Golden in Lipid Clinic (participant in Aegis II study).   F/u w/ pharmD in 3-4 weeks for further med titration. Try to titrate Entresto to 97-103 if BP permits. GDMT nearly optimized, will arrange f/u echo in 3 months w/ visit w/ Dr. Aquilla Solian, PA-C  9:21 AM

## 2021-02-21 NOTE — Patient Instructions (Signed)
INCREASE Entresto to 49/51 mg, one tab twice a day  Labs today We will only contact you if something comes back abnormal or we need to make some changes. Otherwise no news is good news!  Labs needed in 7-10 days  Your physician has requested that you regularly monitor and record your blood pressure readings at home. Please use the same machine at the same time of day to check your readings and record them to bring to your follow-up visit. -please give our office a call if you b/p readings are low (SBP is below 90) or you become dizzy,lightheaded,sluggish etc  Your physician recommends that you schedule a follow-up appointment in: 3-4 weeks with the pharmacy team and 3 months with Dr Gala Romney and echo  Your physician has requested that you have an echocardiogram. Echocardiography is a painless test that uses sound waves to create images of your heart. It provides your doctor with information about the size and shape of your heart and how well your heart's chambers and valves are working. This procedure takes approximately one hour. There are no restrictions for this procedure.  Do the following things EVERYDAY: 1) Weigh yourself in the morning before breakfast. Write it down and keep it in a log. 2) Take your medicines as prescribed 3) Eat low salt foods--Limit salt (sodium) to 2000 mg per day.  4) Stay as active as you can everyday 5) Limit all fluids for the day to less than 2 liters  At [facility], you and your health needs are our priority. As part of our continuing mission to provide you with exceptional heart care, we have created designated Provider Care Teams. These Care Teams include your primary Cardiologist (physician) and Advanced Practice Providers (APPs- Physician Assistants and Nurse Practitioners) who all work together to provide you with the care you need, when you need it.   You may see any of the following providers on your designated Care Team at  your next follow up: Marland Kitchen Dr Arvilla Meres . Dr Marca Ancona . Dr Thornell Mule . Tonye Becket, NP . Robbie Lis, PA . Shanda Bumps [place],NP . Karle Plumber, PharmD   Please be sure to bring in all your medications bottles to every appointment.   If you have any questions or concerns before your next appointment please send [place] a message through [place] or call our office at [phone].    TO LEAVE A MESSAGE FOR THE NURSE SELECT OPTION 2, PLEASE LEAVE A MESSAGE INCLUDING: . YOUR NAME . DATE OF BIRTH . CALL BACK NUMBER . REASON FOR CALL**this is important as we prioritize the call backs  YOU WILL RECEIVE A CALL BACK THE SAME DAY AS LONG AS YOU CALL BEFORE 4:00 PM Please see our updated No Show and Same Day Appointment Cancellation Policy attached to your AVS.

## 2021-02-22 ENCOUNTER — Encounter (HOSPITAL_COMMUNITY)
Admission: RE | Admit: 2021-02-22 | Discharge: 2021-02-22 | Disposition: A | Discharge status: Home / Self Care | Payer: 59 | Source: Ambulatory Visit | Attending: Internal Medicine | Admitting: Internal Medicine

## 2021-02-22 ENCOUNTER — Encounter (HOSPITAL_COMMUNITY): Payer: Self-pay

## 2021-02-22 VITALS — BP 98/62 | HR 69 | Ht 72.0 in | Wt 209.9 lb

## 2021-02-22 DIAGNOSIS — I214 Non-ST elevation (NSTEMI) myocardial infarction: Secondary | ICD-10-CM | POA: Insufficient documentation

## 2021-02-22 DIAGNOSIS — Z955 Presence of coronary angioplasty implant and graft: Secondary | ICD-10-CM | POA: Diagnosis present

## 2021-02-22 HISTORY — DX: Heart failure, unspecified: I50.9

## 2021-02-22 LAB — GLUCOSE, CAPILLARY: Glucose-Capillary: 107 mg/dL — ABNORMAL HIGH (ref 70–99)

## 2021-02-22 NOTE — Research (Signed)
[facility] VISIT 7 PHONE CALL   James Francis is doing well overall. He has been walking over 2 miles through his neighborhood and will start Cardiac Rehab tomorrow. Patient states that he is taking his ASA, Lipitor and Brilinta as directed. He reports no CP, AEs, SAEs, Hospitalizations or Urgent care visits since his last study visit.                                      "CONSENT"   YES     NO   Continuing further Investigational Product and study visits for follow-up? [x]  []   Continuing consent from future biomedical research [x]  []                                      "EVENTS"    YES     NO  AE   (IF YES SEE SOURCE) []  [x]   SAE  (IF YES SEE SOURCE) []  [x]   ENDPOINT   (IF YES SEE SOURCE) []  [x]   REVASCULARIZATION  (IF YES SEE SOURCE) []  [x]   AMPUTATION   (IF YES SEE SOURCE) []  [x]   TROPONIN'S  (IF YES SEE SOURCE) []  [x]    Lifestyle Adherence Assessment:    YES NO  Abstinence from smoking/remaining tobacco free X   Cardiac Diet X   Routine physical activity and/or cardiac rehabilitation X     Current Outpatient Medications:  .  aspirin 81 MG chewable tablet, Chew 1 tablet (81 mg total) by mouth daily., Disp: 90 tablet, Rfl: 1 .  atorvastatin (LIPITOR) 80 MG tablet, Take 1 tablet (80 mg total) by mouth daily., Disp: 90 tablet, Rfl: 1 .  empagliflozin (JARDIANCE) 10 MG TABS tablet, Take 1 tablet (10 mg total) by mouth daily before breakfast., Disp: 30 tablet, Rfl: 2 .  Glucose Blood (BLOOD GLUCOSE TEST STRIPS) STRP, Please dispense based on patient and insurance preference. 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  icosapent Ethyl (VASCEPA) 1 g capsule, Take 1 g by mouth 2 (two) times daily., Disp: , Rfl:  .  metFORMIN (GLUCOPHAGE) 1000 MG tablet, Take 0.5 tablets (500 mg total) by mouth 2 (two) times daily with a meal., Disp: 90 tablet, Rfl: 3 .  metoprolol succinate (TOPROL-XL) 25 MG 24 hr tablet, Take 1 tablet (25 mg total) by mouth daily., Disp: 90  tablet, Rfl: 1 .  nitroGLYCERIN (NITROSTAT) 0.4 MG SL tablet, Place 1 tablet (0.4 mg total) under the tongue every 5 (five) minutes x 3 doses as needed for chest pain., Disp: 25 tablet, Rfl: 2 .  sacubitril-valsartan (ENTRESTO) 49-51 MG, Take 1 tablet by mouth 2 (two) times daily., Disp: 60 tablet, Rfl: 6 .  spironolactone (ALDACTONE) 25 MG tablet, Take 1 tablet (25 mg total) by mouth daily., Disp: 30 tablet, Rfl: 11 .  ticagrelor (BRILINTA) 90 MG TABS tablet, Take 1 tablet (90 mg total) by mouth 2 (two) times daily., Disp: 180 tablet, Rfl: 2

## 2021-02-22 NOTE — Progress Notes (Signed)
Cardiac Individual Treatment Plan  Patient Details  Name: James Francis MRN: [id number] Date of Birth: 05-04-1971 Referring Provider:   Flowsheet Row CARDIAC REHAB PHASE II ORIENTATION from 02/22/2021 in [facility] CARDIAC REHAB  Referring Provider Tonny Bollman, MD      Initial Encounter Date:  Flowsheet Row CARDIAC REHAB PHASE II ORIENTATION from 02/22/2021 in [facility] CARDIAC REHAB  Date 02/22/21      Visit Diagnosis: 12/14/20 NSTEMI  12/14/20 DES LAD  Patient's Home Medications on Admission:  Current Outpatient Medications:  .  aspirin 81 MG chewable tablet, Chew 1 tablet (81 mg total) by mouth daily., Disp: 90 tablet, Rfl: 1 .  atorvastatin (LIPITOR) 80 MG tablet, Take 1 tablet (80 mg total) by mouth daily., Disp: 90 tablet, Rfl: 1 .  empagliflozin (JARDIANCE) 10 MG TABS tablet, Take 1 tablet (10 mg total) by mouth daily before breakfast., Disp: 30 tablet, Rfl: 2 .  Glucose Blood (BLOOD GLUCOSE TEST STRIPS) STRP, Please dispense based on patient and insurance preference. 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  icosapent Ethyl (VASCEPA) 1 g capsule, Take 1 g by mouth 2 (two) times daily., Disp: , Rfl:  .  metFORMIN (GLUCOPHAGE) 1000 MG tablet, Take 0.5 tablets (500 mg total) by mouth 2 (two) times daily with a meal., Disp: 90 tablet, Rfl: 3 .  metoprolol succinate (TOPROL-XL) 25 MG 24 hr tablet, Take 1 tablet (25 mg total) by mouth daily., Disp: 90 tablet, Rfl: 1 .  nitroGLYCERIN (NITROSTAT) 0.4 MG SL tablet, Place 1 tablet (0.4 mg total) under the tongue every 5 (five) minutes x 3 doses as needed for chest pain., Disp: 25 tablet, Rfl: 2 .  sacubitril-valsartan (ENTRESTO) 49-51 MG, Take 1 tablet by mouth 2 (two) times daily., Disp: 60 tablet, Rfl: 6 .  spironolactone (ALDACTONE) 25 MG tablet, Take 1 tablet (25 mg total) by mouth daily., Disp: 30 tablet, Rfl: 11 .  ticagrelor (BRILINTA) 90 MG TABS tablet, Take  1 tablet (90 mg total) by mouth 2 (two) times daily., Disp: 180 tablet, Rfl: 2  Past Medical History: Past Medical History:  Diagnosis Date  . CHF (congestive heart failure) (HCC)   . Colitis    Indeterminate cause  . Coronary artery disease   . Diabetes mellitus without complication (HCC)   . Hearing loss    High frequency bilateral  . History of broken nose    Multiple- has deviated septum  . History of colon polyps   . Hyperlipidemia   . Hypertension   . Ruptured lumbar disc    L4-L5 as a child    Tobacco Use: Social History   Tobacco Use  Smoking Status Never Smoker  Smokeless Tobacco Never Used    Labs: Recent Review Flowsheet Data    Labs for ITP Cardiac and Pulmonary Rehab Latest Ref Rng & Units 06/24/2018 11/18/2018 03/31/2020 10/04/2020 12/14/2020   Cholestrol 0 - 200 mg/dL [phone]   LDLCALC 0 - 99 mg/dL 46 91 91 66 66   HDL >21 mg/dL 30(Q) 49 42 41 44   Trlycerides <150 mg/dL 657 846 962(X) 528(U) 132(G)   Hemoglobin A1c 4.8 - 5.6 % 6.6(H) 6.3(H) 7.0(H) 8.4(H) 8.1(H)      Capillary Blood Glucose: Lab Results  Component Value Date   GLUCAP 107 (H) 02/22/2021   GLUCAP 162 (H) 12/15/2020   GLUCAP 187 (H) 12/15/2020   GLUCAP 180 (H) 12/14/2020  GLUCAP 174 (H) 12/14/2020     Exercise Target Goals: Exercise Program Goal: Individual exercise prescription set using results from initial 6 min walk test and THRR while considering  patient's activity barriers and safety.   Exercise Prescription Goal: Starting with aerobic activity 30 plus minutes a day, 3 days per week for initial exercise prescription. Provide home exercise prescription and guidelines that participant acknowledges understanding prior to discharge.  Activity Barriers & Risk Stratification:  Activity Barriers & Cardiac Risk Stratification - 02/22/21 0849      Activity Barriers & Cardiac Risk Stratification   Activity Barriers None    Cardiac Risk Stratification High            6 Minute Walk:  6 Minute Walk    Row Name 02/22/21 0900         6 Minute Walk   Phase Initial     Distance 2136 feet     Walk Time 6 minutes     # of Rest Breaks 0     MPH 4.04     METS 5.2     RPE 11     Perceived Dyspnea  0     VO2 Peak 18.21     Symptoms No     Resting HR 69 bpm     Resting BP 98/62     Resting Oxygen Saturation  98 %     Exercise Oxygen Saturation  during 6 min walk 100 %     Max Ex. HR 98 bpm     Max Ex. BP 98/62     2 Minute Post BP 98/60            Oxygen Initial Assessment:   Oxygen Re-Evaluation:   Oxygen Discharge (Final Oxygen Re-Evaluation):   Initial Exercise Prescription:  Initial Exercise Prescription - 02/22/21 0900      Date of Initial Exercise RX and Referring Provider   Date 02/22/21    Referring Provider Tonny Bollman, MD    Expected Discharge Date 04/22/21      Treadmill   MPH 3.2    Grade 1    Minutes 15    METs 3.89      NuStep   Level 4    SPM 85    Minutes 15    METs 3.5      Prescription Details   Frequency (times per week) 3    Duration Progress to 30 minutes of continuous aerobic without signs/symptoms of physical distress      Intensity   THRR 40-80% of Max Heartrate 68-137    Ratings of Perceived Exertion 11-13    Perceived Dyspnea 0-4      Progression   Progression Continue to progress workloads to maintain intensity without signs/symptoms of physical distress.      Resistance Training   Training Prescription Yes    Weight 5 lbs    Reps 10-15           Perform Capillary Blood Glucose checks as needed.  Exercise Prescription Changes:   Exercise Comments:   Exercise Goals and Review:   Exercise Goals    Row Name 02/22/21 0823             Exercise Goals   Increase Physical Activity Yes       Intervention Provide advice, education, support and counseling about physical activity/exercise needs.;Develop an individualized exercise prescription for aerobic and resistive  training based on initial evaluation findings, risk stratification, comorbidities and participant's personal goals.  Expected Outcomes Short Term: Attend rehab on a regular basis to increase amount of physical activity.;Long Term: Exercising regularly at least 3-5 days a week.;Long Term: Add in home exercise to make exercise part of routine and to increase amount of physical activity.       Increase Strength and Stamina Yes       Intervention Provide advice, education, support and counseling about physical activity/exercise needs.;Develop an individualized exercise prescription for aerobic and resistive training based on initial evaluation findings, risk stratification, comorbidities and participant's personal goals.       Expected Outcomes Short Term: Increase workloads from initial exercise prescription for resistance, speed, and METs.;Short Term: Perform resistance training exercises routinely during rehab and add in resistance training at home;Long Term: Improve cardiorespiratory fitness, muscular endurance and strength as measured by increased METs and functional capacity ( )       Able to understand and use rate of perceived exertion (RPE) scale Yes       Intervention Provide education and explanation on how to use RPE scale       Expected Outcomes Short Term: Able to use RPE daily in rehab to express subjective intensity level;Long Term:  Able to use RPE to guide intensity level when exercising independently       Knowledge and understanding of Target Heart Rate Range (THRR) Yes       Intervention Provide education and explanation of THRR including how the numbers were predicted and where they are located for reference       Expected Outcomes Short Term: Able to state/look up THRR;Long Term: Able to use THRR to govern intensity when exercising independently;Short Term: Able to use daily as guideline for intensity in rehab       Able to check pulse independently Yes       Intervention  Provide education and demonstration on how to check pulse in carotid and radial arteries.;Review the importance of being able to check your own pulse for safety during independent exercise       Expected Outcomes Short Term: Able to explain why pulse checking is important during independent exercise;Long Term: Able to check pulse independently and accurately       Understanding of Exercise Prescription Yes       Intervention Provide education, explanation, and written materials on patient's individual exercise prescription       Expected Outcomes Short Term: Able to explain program exercise prescription;Long Term: Able to explain home exercise prescription to exercise independently              Exercise Goals Re-Evaluation :    Discharge Exercise Prescription (Final Exercise Prescription Changes):   Nutrition:  Target Goals: Understanding of nutrition guidelines, daily intake of sodium 1500mg , cholesterol 200mg , calories 30% from fat and 7% or less from saturated fats, daily to have 5 or more servings of fruits and vegetables.  Biometrics:  Pre Biometrics - 02/22/21 0821      Pre Biometrics   Waist Circumference 38 inches    Hip Circumference 43.5 inches    Waist to Hip Ratio 0.87 %    Triceps Skinfold 10 mm    % Body Fat 24.2 %    Grip Strength 51.5 kg    Flexibility 10.25 in    Single Leg Stand 30 seconds            Nutrition Therapy Plan and Nutrition Goals:   Nutrition Assessments:  MEDIFICTS Score Key:  ?70 Need to make dietary changes  40-70 Heart Healthy Diet  ? 40 Therapeutic Level Cholesterol Diet   Picture Your Plate Scores:  <60<40 Unhealthy dietary pattern with much room for improvement.  41-50 Dietary pattern unlikely to meet recommendations for good health and room for improvement.  51-60 More healthful dietary pattern, with some room for improvement.   >60 Healthy dietary pattern, although there may be some specific behaviors that could be  improved.    Nutrition Goals Re-Evaluation:   Nutrition Goals Discharge (Final Nutrition Goals Re-Evaluation):   Psychosocial: Target Goals: Acknowledge presence or absence of significant depression and/or stress, maximize coping skills, provide positive support system. Participant is able to verbalize types and ability to use techniques and skills needed for reducing stress and depression.  Initial Review & Psychosocial Screening:  Initial Psych Review & Screening - 02/22/21 1039      Initial Review   Current issues with Current Stress Concerns    Source of Stress Concerns Occupation    Comments Len has newly diagnosed heart failure. Len also admits to having a medium amount of stress.      Family Dynamics   Good Support System? Yes   Len has his wife and family for support     Barriers   Psychosocial barriers to participate in program The patient should benefit from training in stress management and relaxation.      Screening Interventions   Interventions Encouraged to exercise    Expected Outcomes Long Term Goal: Stressors or current issues are controlled or eliminated.;Short Term goal: Identification and review with participant of any Quality of Life or Depression concerns found by scoring the questionnaire.;Long Term goal: The participant improves quality of Life and PHQ9 Scores as seen by post scores and/or verbalization of changes           Quality of Life Scores:  Quality of Life - 02/22/21 0947      Quality of Life   Select Quality of Life      Quality of Life Scores   Health/Function Pre 17.43 %    Socioeconomic Pre 20.21 %    Psych/Spiritual Pre 22.43 %    Family Pre 26.6 %    GLOBAL Pre 20.38 %          Scores of 19 and below usually indicate a poorer quality of life in these areas.  A difference of  2-3 points is a clinically meaningful difference.  A difference of 2-3 points in the total score of the Quality of Life Index has been associated with  significant improvement in overall quality of life, self-image, physical symptoms, and general health in studies assessing change in quality of life.  PHQ-9: Recent Review Flowsheet Data    [facility] 02/22/2021 10/04/2020 03/31/2020 11/18/2018 07/08/2018   Decreased Interest 0 0 0 0 0   Down, Depressed, Hopeless 0 0 0 0 0   PHQ - 2 Score 0 0 0 0 0   Altered sleeping - - 0 - -   Tired, decreased energy - - 0 - -   Change in appetite - - 0 - -   Feeling bad or failure about yourself  - - 0 - -   Trouble concentrating - - 0 - -   Moving slowly or fidgety/restless - - 0 - -   Suicidal thoughts - - 0 - -   PHQ-9 Score - - 0 - -   Difficult doing work/chores - - Not difficult at all - -  Interpretation of Total Score  Total Score Depression Severity:  1-4 = Minimal depression, 5-9 = Mild depression, 10-14 = Moderate depression, 15-19 = Moderately severe depression, 20-27 = Severe depression   Psychosocial Evaluation and Intervention:   Psychosocial Re-Evaluation:   Psychosocial Discharge (Final Psychosocial Re-Evaluation):   Vocational Rehabilitation: Provide vocational rehab assistance to qualifying candidates.   Vocational Rehab Evaluation & Intervention:  Vocational Rehab - 02/22/21 1048      Initial Vocational Rehab Evaluation & Intervention   Assessment shows need for Vocational Rehabilitation No   Len works full time and does not need vocational rehab at this time          Education: Education Goals: Education classes will be provided on a weekly basis, covering required topics. Participant will state understanding/return demonstration of topics presented.  Learning Barriers/Preferences:  Learning Barriers/Preferences - 02/22/21 1201      Learning Barriers/Preferences   Learning Barriers Sight;Hearing   Wears glasses some hearing loss   Learning Preferences Audio;Computer/Internet;Group Instruction;Individual Instruction;Pictoral;None;Skilled  Demonstration;Verbal Instruction;Video;Written Material           Education Topics: Hypertension, Hypertension Reduction -Define heart disease and high blood pressure. Discus how high blood pressure affects the body and ways to reduce high blood pressure.   Exercise and Your Heart -Discuss why it is important to exercise, the FITT principles of exercise, normal and abnormal responses to exercise, and how to exercise safely.   Angina -Discuss definition of angina, causes of angina, treatment of angina, and how to decrease risk of having angina.   Cardiac Medications -Review what the following cardiac medications are used for, how they affect the body, and side effects that may occur when taking the medications.  Medications include Aspirin, Beta blockers, calcium channel blockers, ACE Inhibitors, angiotensin receptor blockers, diuretics, digoxin, and antihyperlipidemics.   Congestive Heart Failure -Discuss the definition of CHF, how to live with CHF, the signs and symptoms of CHF, and how keep track of weight and sodium intake.   Heart Disease and Intimacy -Discus the effect sexual activity has on the heart, how changes occur during intimacy as we age, and safety during sexual activity.   Smoking Cessation / COPD -Discuss different methods to quit smoking, the health benefits of quitting smoking, and the definition of COPD.   Nutrition I: Fats -Discuss the types of cholesterol, what cholesterol does to the heart, and how cholesterol levels can be controlled.   Nutrition II: Labels -Discuss the different components of food labels and how to read food label   Heart Parts/Heart Disease and PAD -Discuss the anatomy of the heart, the pathway of blood circulation through the heart, and these are affected by heart disease.   Stress I: Signs and Symptoms -Discuss the causes of stress, how stress may lead to anxiety and depression, and ways to limit stress.   Stress II:  Relaxation -Discuss different types of relaxation techniques to limit stress.   Warning Signs of Stroke / TIA -Discuss definition of a stroke, what the signs and symptoms are of a stroke, and how to identify when someone is having stroke.   Knowledge Questionnaire Score:  Knowledge Questionnaire Score - 02/22/21 0952      Knowledge Questionnaire Score   Pre Score 23/24           Core Components/Risk Factors/Patient Goals at Admission:  Personal Goals and Risk Factors at Admission - 02/22/21 0953      Core Components/Risk Factors/Patient Goals on Admission    Weight Management  Yes    Intervention Weight Management: Develop a combined nutrition and exercise program designed to reach desired caloric intake, while maintaining appropriate intake of nutrient and fiber, sodium and fats, and appropriate energy expenditure required for the weight goal.;Weight Management: Provide education and appropriate resources to help participant work on and attain dietary goals.    Diabetes Yes    Intervention Provide education about signs/symptoms and action to take for hypo/hyperglycemia.;Provide education about proper nutrition, including hydration, and aerobic/resistive exercise prescription along with prescribed medications to achieve blood glucose in normal ranges: Fasting glucose 65-99 mg/dL    Expected Outcomes Short Term: Participant verbalizes understanding of the signs/symptoms and immediate care of hyper/hypoglycemia, proper foot care and importance of medication, aerobic/resistive exercise and nutrition plan for blood glucose control.;Long Term: Attainment of HbA1C < 7%.    Heart Failure Yes    Intervention Provide a combined exercise and nutrition program that is supplemented with education, support and counseling about heart failure. Directed toward relieving symptoms such as shortness of breath, decreased exercise tolerance, and extremity edema.    Expected Outcomes Improve functional  capacity of life;Short term: Attendance in program 2-3 days a week with increased exercise capacity. Reported lower sodium intake. Reported increased fruit and vegetable intake. Reports medication compliance.;Short term: Daily weights obtained and reported for increase. Utilizing diuretic protocols set by physician.;Long term: Adoption of self-care skills and reduction of barriers for early signs and symptoms recognition and intervention leading to self-care maintenance.    Hypertension Yes    Intervention Provide education on lifestyle modifcations including regular physical activity/exercise, weight management, moderate sodium restriction and increased consumption of fresh fruit, vegetables, and low fat dairy, alcohol moderation, and smoking cessation.;Monitor prescription use compliance.    Expected Outcomes Short Term: Continued assessment and intervention until BP is < 140/48mm HG in hypertensive participants. < 130/81mm HG in hypertensive participants with diabetes, heart failure or chronic kidney disease.;Long Term: Maintenance of blood pressure at goal levels.    Lipids Yes    Intervention Provide education and support for participant on nutrition & aerobic/resistive exercise along with prescribed medications to achieve LDL 70mg , HDL >40mg .    Expected Outcomes Short Term: Participant states understanding of desired cholesterol values and is compliant with medications prescribed. Participant is following exercise prescription and nutrition guidelines.;Long Term: Cholesterol controlled with medications as prescribed, with individualized exercise RX and with personalized nutrition plan. Value goals: LDL < , HDL > 40 mg.    Stress Yes    Intervention Offer individual and/or small group education and counseling on adjustment to heart disease, stress management and health-related lifestyle change. Teach and support self-help strategies.;Refer participants experiencing significant psychosocial  distress to appropriate mental health specialists for further evaluation and treatment. When possible, include family members and significant others in education/counseling sessions.    Expected Outcomes Short Term: Participant demonstrates changes in health-related behavior, relaxation and other stress management skills, ability to obtain effective social support, and compliance with psychotropic medications if prescribed.;Long Term: Emotional wellbeing is indicated by absence of clinically significant psychosocial distress or social isolation.           Core Components/Risk Factors/Patient Goals Review:    Core Components/Risk Factors/Patient Goals at Discharge (Final Review):    ITP Comments:  ITP Comments    Row Name 02/22/21 4098           ITP Comments Medical Director- Dr. Armanda Magic, MD              Comments:  Len attended orientation on 02/22/2021 to review rules and guidelines for program.  Completed 6 minute walk test, Intitial ITP, and exercise prescription.  VSS. Telemetry-Sinus.  Asymptomatic although systolic BP noted in the upper 90's will forward to the heart failure clinic. Safety measures and social distancing in place per CDC guidelines. Len also stated that he is not sure that he will complete the program as he already exercises on a daily basis. Gladstone Lighter, RN,BSN 02/22/2021 12:10 PM

## 2021-02-22 NOTE — Progress Notes (Signed)
Cardiac Rehab Medication Review by a Nurse  Does the patient  feel that his/her medications are working for him/her?  yes  Has the patient been experiencing any side effects to the medications prescribed?  no  Does the patient measure his/her own blood pressure or blood glucose at home?  yes   Does the patient have any problems obtaining medications due to transportation or finances?   no  Understanding of regimen: excellent Understanding of indications: excellent Potential of compliance: excellent    Nurse comments: James Francis is taking his medications as prescribed and has a good understanding of what they are for. James Francis checks his blood pressures at home but does not check his blood sugars. Patient states he is not diabetic.    Thayer Headings RN BSN 02/22/2021 9:24 AM

## 2021-02-23 ENCOUNTER — Telehealth (HOSPITAL_COMMUNITY): Payer: Self-pay | Admitting: *Deleted

## 2021-02-23 NOTE — Telephone Encounter (Signed)
-----   Message from Allayne Butcher, [place] sent at 02/22/2021  5:13 PM EDT ----- Regarding: RE: Low BP's from cardiac rehab orientation Thanks. We just increased his Entresto. Hopefully will improve soon. If SBPs <90 or if symptomatic, let me know. May need to scale back if this happens.    ----- Message ----- From: Cammy Copa, RN Sent: 02/22/2021  12:19 PM EDT To: Brittainy Sherlynn Carbon, PA-C Subject: Low BP's from cardiac rehab orientation        Good afternoon [place],  James Francis attended cardiac rehab orientation this morning.  Vital signs are as follows  Pre walk test heart rate 69. Blood pressure 98/62 Post walk test heart rate 98 Blood pressure 98/60 2 minute post walk test Blood pressure 98/60  James Francis was asymptomatic today.  Please let us know if you have any concerns or reccomendations  Sincerely, Gladstone Lighter RN Cardiac Rehab

## 2021-02-28 ENCOUNTER — Encounter (HOSPITAL_COMMUNITY)
Admission: RE | Admit: 2021-02-28 | Discharge: 2021-02-28 | Disposition: A | Discharge status: Home / Self Care | Payer: 59 | Source: Ambulatory Visit | Attending: Cardiology | Admitting: Cardiology

## 2021-02-28 ENCOUNTER — Other Ambulatory Visit: Payer: Self-pay | Admitting: Cardiology

## 2021-02-28 ENCOUNTER — Other Ambulatory Visit: Payer: Self-pay

## 2021-02-28 ENCOUNTER — Ambulatory Visit (HOSPITAL_COMMUNITY)
Admission: RE | Admit: 2021-02-28 | Discharge: 2021-02-28 | Disposition: A | Discharge status: Home / Self Care | Payer: 59 | Source: Ambulatory Visit | Attending: Cardiology | Admitting: Cardiology

## 2021-02-28 DIAGNOSIS — I214 Non-ST elevation (NSTEMI) myocardial infarction: Secondary | ICD-10-CM | POA: Diagnosis not present

## 2021-02-28 DIAGNOSIS — Z955 Presence of coronary angioplasty implant and graft: Secondary | ICD-10-CM

## 2021-02-28 DIAGNOSIS — I255 Ischemic cardiomyopathy: Secondary | ICD-10-CM | POA: Diagnosis not present

## 2021-02-28 LAB — BASIC METABOLIC PANEL
Anion gap: 7 (ref 5–15)
BUN: 16 mg/dL (ref 6–20)
CO2: 24 mmol/L (ref 22–32)
Calcium: 9.6 mg/dL (ref 8.9–10.3)
Chloride: 106 mmol/L (ref 98–111)
Creatinine, Ser: 1.13 mg/dL (ref 0.61–1.24)
GFR, Estimated: >60 mL/min (ref >60)
Glucose, Bld: 102 mg/dL — ABNORMAL HIGH (ref 70–99)
Potassium: 4.1 mmol/L (ref 3.5–5.1)
Sodium: 137 mmol/L (ref 135–145)

## 2021-02-28 LAB — GLUCOSE, CAPILLARY: Glucose-Capillary: 105 mg/dL — ABNORMAL HIGH (ref 70–99)

## 2021-02-28 NOTE — Progress Notes (Signed)
Len started cardiac rehab today. Tolerated exercise very well rating the exercise as very light. VSS, telemetry-SR. CBG 105. No reports of chest discomfort, no shortness of breath. Oriented to gym routine. PHQ score 0. Exhibits positive coping skills. Reviewed QOL scores with the area of health and functioning rating 17.43. Patient shared he desires to return to the levels of physical activity and exercise he had prior to diagnosis and event. He also verbalized the long wait to start the cardiac rehab program has delayed his goal of progressing to where he feels he should be by now and returning to the exercise he enjoys, such as cycling.

## 2021-03-01 ENCOUNTER — Other Ambulatory Visit (HOSPITAL_COMMUNITY): Payer: Self-pay

## 2021-03-01 ENCOUNTER — Telehealth (HOSPITAL_COMMUNITY): Payer: Self-pay | Admitting: *Deleted

## 2021-03-01 MED ORDER — ICOSAPENT ETHYL 1 G PO CAPS: 1.0000 g | ORAL_CAPSULE | Freq: Two times a day (BID) | ORAL | 3 refills | Status: DC

## 2021-03-01 MED ORDER — EMPAGLIFLOZIN 10 MG PO TABS: 10.0000 mg | ORAL_TABLET | Freq: Every day | ORAL | 2 refills | Status: DC

## 2021-03-01 NOTE — Telephone Encounter (Addendum)
Patient left message on department voicemail requesting to cancel remaining cardiac rehab sessions and call back. Returned patient's call, no answer, message left on voicemail. Cancelled patient's remaining cardiac rehab appointments in Epic.  Artist Pais, MS, ACSM CEP 03/01/2021 [phone]

## 2021-03-02 ENCOUNTER — Encounter (HOSPITAL_COMMUNITY): Payer: 59

## 2021-03-04 ENCOUNTER — Encounter (HOSPITAL_COMMUNITY): Payer: 59

## 2021-03-04 MED ORDER — ICOSAPENT ETHYL 1 G PO CAPS: 2.0000 g | ORAL_CAPSULE | Freq: Two times a day (BID) | ORAL | 3 refills | Status: DC

## 2021-03-04 NOTE — Addendum Note (Signed)
Addended by: Julio Sicks on: 03/04/2021 07:44 AM   Modules accepted: Orders

## 2021-03-07 ENCOUNTER — Encounter (HOSPITAL_COMMUNITY): Payer: 59

## 2021-03-09 ENCOUNTER — Encounter (HOSPITAL_COMMUNITY): Payer: 59

## 2021-03-11 ENCOUNTER — Encounter (HOSPITAL_COMMUNITY): Payer: 59

## 2021-03-14 ENCOUNTER — Encounter (HOSPITAL_COMMUNITY): Payer: 59

## 2021-03-15 ENCOUNTER — Encounter: Payer: Self-pay | Admitting: *Deleted

## 2021-03-15 DIAGNOSIS — Z006 Encounter for examination for normal comparison and control in clinical research program: Secondary | ICD-10-CM

## 2021-03-15 NOTE — Research (Signed)
AEGIS VISIT 8 phone call due to COVID precaution and patient request.   James Francis is doing well overall. He has been walking over 2 miles through his neighborhood and has finished going to Cardiac Rehab.  Patient states that he is taking his ASA, Lipitor and Brilinta as directed. He reports no CP, AEs, SAEs, Hospitalizations or Urgent care visits since his last study visit. His only medication change is an increase to 50 mg of Entresto.                                    "CONSENT"   YES     NO   Continuing further Investigational Product and study visits for follow-up? [x]  []   Continuing consent from future biomedical research [x]  []                                      "EVENTS"    YES     NO  AE   (IF YES SEE SOURCE) []  [x]   SAE  (IF YES SEE SOURCE) []  [x]   ENDPOINT   (IF YES SEE SOURCE) []  [x]   REVASCULARIZATION  (IF YES SEE SOURCE) []  [x]   AMPUTATION   (IF YES SEE SOURCE) []  [x]   TROPONIN'S  (IF YES SEE SOURCE) []  [x]    Lifestyle Adherence Assessment:    YES NO  Abstinence from smoking/remaining tobacco free X   Cardiac Diet X   Routine physical activity and/or cardiac rehabilitation X     Current Outpatient Medications:  .  aspirin 81 MG chewable tablet, Chew 1 tablet (81 mg total) by mouth daily., Disp: 90 tablet, Rfl: 1 .  atorvastatin (LIPITOR) 80 MG tablet, Take 1 tablet (80 mg total) by mouth daily., Disp: 90 tablet, Rfl: 1 .  empagliflozin (JARDIANCE) 10 MG TABS tablet, Take 1 tablet (10 mg total) by mouth daily before breakfast., Disp: 30 tablet, Rfl: 2 .  Glucose Blood (BLOOD GLUCOSE TEST STRIPS) STRP, Please dispense based on patient and insurance preference. 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  icosapent Ethyl (VASCEPA) 1 g capsule, Take 2 capsules (2 g total) by mouth 2 (two) times daily., Disp: 120 capsule, Rfl: 3 .  metFORMIN (GLUCOPHAGE) 1000 MG tablet, Take 0.5 tablets (500 mg total) by mouth 2 (two) times daily with a meal.,  Disp: 90 tablet, Rfl: 3 .  metoprolol succinate (TOPROL-XL) 25 MG 24 hr tablet, Take 1 tablet (25 mg total) by mouth daily., Disp: 90 tablet, Rfl: 1 .  nitroGLYCERIN (NITROSTAT) 0.4 MG SL tablet, Place 1 tablet (0.4 mg total) under the tongue every 5 (five) minutes x 3 doses as needed for chest pain., Disp: 25 tablet, Rfl: 2 .  sacubitril-valsartan (ENTRESTO) 49-51 MG, Take 1 tablet by mouth 2 (two) times daily., Disp: 60 tablet, Rfl: 6 .  spironolactone (ALDACTONE) 25 MG tablet, Take 1 tablet (25 mg total) by mouth daily., Disp: 30 tablet, Rfl: 11 .  ticagrelor (BRILINTA) 90 MG TABS tablet, Take 1 tablet (90 mg total) by mouth 2 (two) times daily., Disp: 180 tablet, Rfl: 2    [id number]    Completed verbally during our phone call VISIT 8.   MOBILITY:    I HAVE NO PROBLEMS WALKING [x]   I HAVE SLIGHT PROBLEMS WALKING []   I HAVE MODERATE  PROBLEMS WALKING []   I HAVE SEVERE PROBLEMS WALKING []   I AM UNABLE TO WALK  []     SELF-CARE:   I HAVE NO PROBLEMS WASING OR DRESSING MYSELF  [x]   I HAVE SLIGHT PROBLEMS WASHING OR DRESSING MYSELF  []   I HAVE MODERATE PROBLEMS WASHING OR DRESSING MYSELF []   I HAVE SEVERE PROBLEMS WASHING OR DRESSING MYSELF  []   I HAVE SEVERE PROBLEMS WASHING OR DRESSING MYSELF  []   I AM UNABLE TO WASH OR DRESS MYSELF []     USUAL ACTIVITIES: (E.G. WORK/STUDY/HOUSEWORK/FAMILY OR LEISURE ACTIVITIES.    I HAVE NO PROBLEMS DOING MY USUAL ACTIVITIES [x]   I HAVE SLIGHT PROBLEMS DOING MY USUAL ACTIVITIES []   I HAVE MODERATE PROBLEMS DOING MY USUAL ACTIVIITIES []   I HAVE SEVERE PROBLEMS DOING MY USUAL ACTIVITIES []   I AM UNABLE TO DO MY USUAL ACTIVITIES []     PAIN /DISCOMFORT   I HAVE NO PAIN OR DISCOMFORT [x]   I HAVE SLIGHT PAIN OR DISCOMFORT []   I HAVE MODERATE PAIN OR DISCOMFORT []   I HAVE SEVERE PAIN OR DISCOMFORT []   I HAVE EXTREME PAIN OR DISCOMFORT []     ANXIETY/DEPRESSION   I AM NOT ANXIOUS OR DEPRESSED [x]   I AM SLIGHTLY ANXIOUS OR DEPRESSED []   I AM MODERATELY  ANXIOUS OR DREPRESSED []   I AM SEVERELY ANXIOUS OR DEPRESSED []   I AM EXTREMELY ANXIOUS OR DEPRESSED []     SCALE OF 0-100 HOW WOULD YOU RATE TODAY?  0 IS THE WORSE AND 100 IS THE BEST HEALTH YOU CAN IMAGINE: 65

## 2021-03-16 ENCOUNTER — Encounter (HOSPITAL_COMMUNITY): Payer: 59

## 2021-03-16 ENCOUNTER — Other Ambulatory Visit: Payer: Self-pay | Admitting: Cardiology

## 2021-03-16 MED ORDER — TICAGRELOR 90 MG PO TABS: 90.0000 mg | ORAL_TABLET | Freq: Two times a day (BID) | ORAL | 3 refills | Status: DC

## 2021-03-18 ENCOUNTER — Encounter (HOSPITAL_COMMUNITY): Payer: 59

## 2021-03-21 ENCOUNTER — Encounter (HOSPITAL_COMMUNITY): Payer: 59

## 2021-03-23 ENCOUNTER — Encounter (HOSPITAL_COMMUNITY): Payer: 59

## 2021-03-25 ENCOUNTER — Encounter (HOSPITAL_COMMUNITY): Payer: 59

## 2021-03-27 NOTE — Progress Notes (Incomplete)
***In Progress*** Referring Physician: Dr. Frances Furbish PCP: Salley Scarlet, MD PCP-Cardiologist: Tonny Bollman, MD  HF Cardiologist: Dr. 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 Primarily managed by his PCP before NSTEMI admission.  No prior cardiac history or admissions. Strong family history of cardiac disease.      He was admitted on 12/13/20 with NSTEMI and taken for LHC. He had 100% mid-LAD, DES to mid-LAD placed. Non-obstructive CAD in RCA and LCX. Echo with EF 35-40%, RV normal.   He was [facility] in [facility] post-discharge and was feeling well. Was able to walk 2 miles in neighborhood, no SOB, CP, dizziness. Continued aggressive secondary prevention and medical therapy for LV dysfunction.   He presented for follow-up with Dr. Gala Romney on 01/03/21. Overall was feeling fine. Was walking 1 mile/day in neighborhood, 2 miles/day on weekends. Enjoyed his road bike. Works as an Acupuncturist. Denied increasing SOB, CP, dizziness, edema, or PND/Orthopnea. Appetite was ok. Weight at home ~215 lbs.   He returned to HF clinic for pharmacist medication titration on 01/24/21. At last visit with MD, spironolactone 12.5 mg daily was started. Overall was feeling well . BP stable around 106/70s at home. No dizziness, lightheadedness, or fatigue. No chest pain or palpitations. Breathing was great, able to walk 2 miles before feeling SOB. Weight was down 4 lbs in clinic. No LEE, PND/Orthopnea. Appetite was good, adhered to low-salt diet. Was taking all medications as prescribed and tolerating them well.  He returned for follow-up with Robbie Lis, PA-C on 02/21/21. He was feeling well without complaints. Was riding bike. NYHA I-II symptoms. Reported full medication compliance. SBP at home around 110. No orthostatic symptoms with medications.   Today he  returns to HF clinic for pharmacist medication titration. At last visit with APP, Sherryll Burger was increased to 49/51 mg BID. ECHO today ***.  108/70, 60, 213 lbs - no bmet needed A) If BP has any room can try to incr Entresto but doubt it'll be high enough - HR has been low 50-60s > metop 25 continue F/u 5/26 Dr Excell Seltzer 7/11 with DB   Overall feeling ***. Dizziness, lightheadedness, fatigue:  Chest pain or palpitations:  How is your breathing?: *** SOB: Able to complete all ADLs. Activity level ***  Weight at home pounds. Takes furosemide/torsemide/bumex *** mg *** daily.  LEE PND/Orthopnea  Appetite *** Low-salt diet:   Physical Exam Cost/affordability of meds   HF Medications: Metoprolol succinate 25 mg daily Entresto 49/51 mg BID Spironolactone 25 mg daily Jardiance 10 mg daily  Has the patient been experiencing any side effects to the medications prescribed?  no  Does the patient have any problems obtaining medications due to transportation or finances?   no - Has [facility] of regimen: good Understanding of indications: good Potential of compliance: good Patient understands to avoid NSAIDs. Patient understands to avoid decongestants.    Pertinent Lab Values: . 02/28/21: Serum creatinine 1.13, BUN 16, Potassium 4.1, Sodium 137  Vital Signs: . Weight: *** lbs (last clinic weight: 213 lbs) . Blood pressure: 116/62 *** . Heart rate: 60 ***  Assessment/Plan: 1. Chronic Combined Systolic and Diastolic CHF, Ischemic Cardiomyopathy: - Recent NSTEMI 12/2020, s/p PCI to mid LAD. - Echo (1/22): EF 35-40%, G2DD w/ Moderate LVH. - NYHA Class I-II. Volume good today & weight stable. No  loop diuretic requirements. - Continue metoprolol succinate 25 mg daily - Continue Entresto 49/51 mg BID - Continue spironolactone 25 mg daily - Continue Jardiance 10 mg daily. - Continue cardiac rehab. OK to resume bike if HR stays 120-130s. - Plans noted  to repeat echo in 3 months when GDMT optimized (~4/22). - Follow up in [facility] in 3 weeks on 02/14/21.  2. CAD:  - S/p NSTEMI with PCI to mid LAD.  RCA and LCx 50% - No chest pain. - Continue ASA, Brilinta, beta-blocker + statin.  3. HTN - Stable today. - Continue Entresto, metoprolol succinate, spironolactone as above - Says he does not snore, could consider Sleep Study.  4. T2DM: - 12/2020 Hgb A1C 8.1 - On Jardiance and metformin. - Previously recommended adding Ozempic; deferred to PCP.  5. HLD: - Triglycerides 345, LDL 69 (2/22). - Continue statin + Vascepa. - Following with Dr. Rennis Golden in Lipid Clinic (participates in Aegis II study).  6. Obesity: - Body mass index is 29.97 kg/m. - Encouraged portion control and increased physical activity. - Consider adding Ozempic  [name], PharmD, BCPS PGY2 Cardiology Pharmacy Resident  Karle Plumber, PharmD, BCPS, [facility], CPP Heart Failure Clinic Pharmacist [phone]

## 2021-03-28 ENCOUNTER — Encounter (HOSPITAL_COMMUNITY): Payer: 59

## 2021-03-28 ENCOUNTER — Other Ambulatory Visit: Payer: Self-pay

## 2021-03-28 ENCOUNTER — Ambulatory Visit (HOSPITAL_BASED_OUTPATIENT_CLINIC_OR_DEPARTMENT_OTHER)
Admission: RE | Admit: 2021-03-28 | Discharge: 2021-03-28 | Disposition: A | Discharge status: Home / Self Care | Payer: 59 | Source: Ambulatory Visit | Attending: Cardiology | Admitting: Cardiology

## 2021-03-28 ENCOUNTER — Telehealth (HOSPITAL_COMMUNITY): Payer: Self-pay | Admitting: Cardiology

## 2021-03-28 ENCOUNTER — Ambulatory Visit (HOSPITAL_COMMUNITY)
Admission: RE | Admit: 2021-03-28 | Discharge: 2021-03-28 | Disposition: A | Discharge status: Home / Self Care | Payer: 59 | Source: Ambulatory Visit | Attending: Cardiology | Admitting: Cardiology

## 2021-03-28 VITALS — BP 116/62 | HR 64 | Wt 211.6 lb

## 2021-03-28 DIAGNOSIS — I110 Hypertensive heart disease with heart failure: Secondary | ICD-10-CM | POA: Diagnosis not present

## 2021-03-28 DIAGNOSIS — Z6828 Body mass index (BMI) 28.0-28.9, adult: Secondary | ICD-10-CM | POA: Diagnosis not present

## 2021-03-28 DIAGNOSIS — Z79899 Other long term (current) drug therapy: Secondary | ICD-10-CM | POA: Diagnosis not present

## 2021-03-28 DIAGNOSIS — E8881 Metabolic syndrome: Secondary | ICD-10-CM | POA: Diagnosis not present

## 2021-03-28 DIAGNOSIS — I5022 Chronic systolic (congestive) heart failure: Secondary | ICD-10-CM

## 2021-03-28 DIAGNOSIS — I5042 Chronic combined systolic (congestive) and diastolic (congestive) heart failure: Secondary | ICD-10-CM | POA: Diagnosis present

## 2021-03-28 DIAGNOSIS — I2510 Atherosclerotic heart disease of native coronary artery without angina pectoris: Secondary | ICD-10-CM | POA: Insufficient documentation

## 2021-03-28 DIAGNOSIS — E119 Type 2 diabetes mellitus without complications: Secondary | ICD-10-CM | POA: Insufficient documentation

## 2021-03-28 DIAGNOSIS — I252 Old myocardial infarction: Secondary | ICD-10-CM | POA: Insufficient documentation

## 2021-03-28 DIAGNOSIS — Z7984 Long term (current) use of oral hypoglycemic drugs: Secondary | ICD-10-CM | POA: Insufficient documentation

## 2021-03-28 DIAGNOSIS — E785 Hyperlipidemia, unspecified: Secondary | ICD-10-CM | POA: Insufficient documentation

## 2021-03-28 DIAGNOSIS — E669 Obesity, unspecified: Secondary | ICD-10-CM | POA: Insufficient documentation

## 2021-03-28 DIAGNOSIS — I255 Ischemic cardiomyopathy: Secondary | ICD-10-CM | POA: Diagnosis not present

## 2021-03-28 LAB — ECHOCARDIOGRAM COMPLETE
Area-P 1/2: 2.83 cm2
P 1/2 time: 518 msec
S' Lateral: 2.6 cm
Single Plane A4C EF: 53.9 %

## 2021-03-28 MED ORDER — ENTRESTO 97-103 MG PO TABS: 1.0000 | ORAL_TABLET | Freq: Two times a day (BID) | ORAL | 11 refills | Status: DC

## 2021-03-28 NOTE — Patient Instructions (Signed)
It was a pleasure seeing you today!  MEDICATIONS: -We are changing your medications today -Increase Entresto to 97/103 mg (1 tablet) twice daily. You may taking 2 tablets twice daily of your current supply until you run out. -Call if you have questions about your medications.  NEXT APPOINTMENT: Keep your appointment with Dr. Excell Seltzer in 1 week. Return to clinic in 2 months with Dr. Gala Romney.  In general, to take care of your heart failure: -Limit your fluid intake to 2 Liters (half-gallon) per day.   -Limit your salt intake to ideally 2-3 grams (2000-3000 mg) per day. -Weigh yourself daily and record, and bring that "weight diary" to your next appointment.  (Weight gain of 2-3 pounds in 1 day typically means fluid weight.) -The medications for your heart are to help your heart and help you live longer.   -Please contact us before stopping any of your heart medications.  Call the clinic at [phone] with questions or to reschedule future appointments.

## 2021-03-28 NOTE — Telephone Encounter (Signed)
Per Dr Gala Romney pt will need zio xt monitor this week  Pt aware and voiced understanding Parking details reviewed with patient

## 2021-03-28 NOTE — Progress Notes (Signed)
Referring Physician: Dr. Frances Furbish PCP: Salley Scarlet, MD PCP-Cardiologist: Tonny Bollman, MD  HF Cardiologist: Dr. 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 Primarily managed by his PCP before NSTEMI admission.  No prior cardiac history or admissions. Strong family history of cardiac disease.      He was admitted on 12/13/20 with NSTEMI and taken for LHC. He had 100% mid-LAD, DES to mid-LAD placed. Non-obstructive CAD in RCA and LCX. Echo with EF 35-40%, RV normal.   He was [facility] in [facility] post-[facility] and was feeling well. Was able to walk 2 miles in neighborhood, no SOB, CP, dizziness. Continued aggressive secondary prevention and medical therapy for LV dysfunction.   He presented for follow-up with Dr. Gala Romney on 01/03/21. Overall was feeling fine. Was walking 1 mile/day in neighborhood, 2 miles/day on weekends. Enjoyed his road bike. Works as an Acupuncturist. Denied increasing SOB, CP, dizziness, edema, or PND/Orthopnea. Appetite was ok. Weight at home ~215 lbs.   He returned to HF clinic for pharmacist medication titration on 01/24/21. At last visit with MD, spironolactone 12.5 mg daily was started. Overall was feeling well . BP stable around 106/70s at home. No dizziness, lightheadedness, or fatigue. No chest pain or palpitations. Breathing was great, able to walk 2 miles before feeling SOB. Weight was down 4 lbs in clinic. No LEE, PND/Orthopnea. Appetite was good, adhered to low-salt diet. Was taking all medications as prescribed and tolerating them well.  He returned for follow-up with Robbie Lis, PA-C on 02/21/21. He was feeling well without complaints. Was riding bike. NYHA I-II symptoms. Reported full medication compliance. SBP at home around 110. No orthostatic symptoms with medications.   Today he returns to HF clinic  for pharmacist medication titration. At last visit with APP, Sherryll Burger was increased to 49/51 mg BID. ECHO today with improved EF 60-65%. He is feeling well overall. He graduated from cardiac rehab and is riding his bike more. No dizziness, lightheadedness, or fatigue. No CP or palpitations. Breathing is fine. Home SBP mostly 110s, recently up to 120s with some days around 108. Weight at home stable 204 lbs. No LEE, PND, orthopnea. Appetite is stable, following low-salt diet. Taking all medications as prescribed.  HF Medications: Metoprolol succinate 25 mg daily Entresto 49/51 mg BID Spironolactone 25 mg daily Jardiance 10 mg daily  Has the patient been experiencing any side effects to the medications prescribed?  no  Does the patient have any problems obtaining medications due to transportation or finances?   no - Has [employer]  Understanding of regimen: good Understanding of indications: good Potential of compliance: good Patient understands to avoid NSAIDs. Patient understands to avoid decongestants.    Pertinent Lab Values: . 02/28/21: Serum creatinine 1.13, BUN 16, Potassium 4.1, Sodium 137  Vital Signs: . Weight: 211 lbs (last clinic weight: 209 lbs) . Blood pressure: 116/62 mmHg . Heart rate: 64 bpm  Assessment/Plan: 1. Chronic Combined Systolic and Diastolic CHF, Ischemic Cardiomyopathy: - Recent NSTEMI 12/2020, s/p PCI to mid LAD. - Echo (12/14/20): EF 35-40%, G2DD w/ Moderate LVH. - Echo (03/28/21) today: EF 60-65%, G1DD w/ mild LVH. - NYHA Class I-II. Euvolemic on exam and weight stable. No loop diuretic requirements. - Continue metoprolol succinate 25 mg daily - Increase Entresto to 97/103 mg BID. Check BMET at next visit. - Continue  spironolactone 25 mg daily - Continue Jardiance 10 mg daily - Completed cardiac rehab  2. CAD:  - S/p NSTEMI with PCI to mid LAD.  RCA and LCx 50% - No chest pain. - Continue ASA, Brilinta, beta-blocker +  statin.  3. HTN - Stable today - Increase Entresto as above - Continue metoprolol succinate, spironolactone as above - Says he does not snore, could consider Sleep Study.  4. T2DM: - 12/2020 Hgb A1C 8.1 - On Jardiance and metformin. - Previously recommended adding Ozempic; deferred to PCP.  5. HLD: - Triglycerides 345, LDL 69 (2/22). - Continue statin + Vascepa. - Following with Dr. Rennis Golden in Lipid Clinic (participates in Aegis II study).  6. Obesity: - Body mass index is 28.70 kg/m. - Encouraged portion control and increased physical activity. - Consider adding Ozempic  Follow-up office visit with Dr. Excell Seltzer in 1 week and with Dr. Gala Romney in 2 months.  [name], PharmD, BCPS PGY2 Cardiology Pharmacy Resident  Sharen Hones, PharmD, BCPS Heart Failure Clinic Pharmacist [phone]

## 2021-03-28 NOTE — Progress Notes (Signed)
  Echocardiogram 2D Echocardiogram has been performed.  James Francis 03/28/2021, 9:27 AM

## 2021-03-29 ENCOUNTER — Encounter: Payer: Self-pay | Admitting: Gastroenterology

## 2021-03-30 ENCOUNTER — Encounter (HOSPITAL_COMMUNITY): Payer: 59

## 2021-04-01 ENCOUNTER — Encounter (HOSPITAL_COMMUNITY): Payer: 59

## 2021-04-04 ENCOUNTER — Encounter (HOSPITAL_COMMUNITY): Payer: 59

## 2021-04-04 ENCOUNTER — Encounter (HOSPITAL_COMMUNITY): Payer: Self-pay | Admitting: Cardiology

## 2021-04-06 ENCOUNTER — Encounter (HOSPITAL_COMMUNITY): Payer: 59

## 2021-04-07 ENCOUNTER — Other Ambulatory Visit: Payer: Self-pay

## 2021-04-07 ENCOUNTER — Encounter: Payer: Self-pay | Admitting: Cardiovascular Disease

## 2021-04-07 ENCOUNTER — Ambulatory Visit (INDEPENDENT_AMBULATORY_CARE_PROVIDER_SITE_OTHER): Payer: 59 | Admitting: Cardiovascular Disease

## 2021-04-07 VITALS — BP 110/62 | HR 64 | Ht 72.0 in | Wt 206.0 lb

## 2021-04-07 DIAGNOSIS — E782 Mixed hyperlipidemia: Secondary | ICD-10-CM | POA: Diagnosis not present

## 2021-04-07 DIAGNOSIS — I255 Ischemic cardiomyopathy: Secondary | ICD-10-CM | POA: Diagnosis not present

## 2021-04-07 MED ORDER — ICOSAPENT ETHYL 1 G PO CAPS: 2.0000 g | ORAL_CAPSULE | Freq: Two times a day (BID) | ORAL | 3 refills | Status: DC

## 2021-04-07 MED ORDER — EMPAGLIFLOZIN 10 MG PO TABS: 10.0000 mg | ORAL_TABLET | Freq: Every day | ORAL | 3 refills | Status: DC

## 2021-04-07 NOTE — Addendum Note (Signed)
Addended by: Gunnar Fusi A on: 04/07/2021 02:57 PM   Modules accepted: Orders

## 2021-04-07 NOTE — Progress Notes (Signed)
Cardiology Office Note:    Date:  04/07/2021   ID:  James Francis, DOB Apr 16, 1971, MRN [id number]  PCP:  Aviva Kluver   [facility] Providers Cardiologist:  Tonny Bollman, MD     Referring MD: Salley Scarlet, MD   Chief Complaint  Patient presents with  . Coronary Artery Disease    History of Present Illness:    James Francis is a 50 y.o. male with a hx of coronary artery disease with non-STEMI in February 2022.  The patient has a background history of hypertension, mixed hyperlipidemia, and type 2 diabetes.  He underwent cardiac catheterization demonstrating total occlusion of the LAD and was treated with PCI.  He initially had moderately severe ischemic cardiomyopathy with LVEF 35 to 40%.  He followed up in the heart failure clinic and was started on Entresto.  He recently underwent a repeat echo demonstrating complete normalization of LV function with no regional wall motion abnormalities.  LVEF is now 60 to 65%.  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  He exercises on the weekends and rides his bike.  He has no exertional symptoms. Today, he denies symptoms of palpitations, chest pain, shortness of breath, orthopnea, PND, lower extremity edema, dizziness, or syncope.   Past Medical History:  Diagnosis Date  . CHF (congestive heart failure) (HCC)   . Colitis    Indeterminate cause  . Coronary artery disease   . Diabetes mellitus without complication (HCC)   . Hearing loss    High frequency bilateral  . History of broken nose    Multiple- has deviated septum  . History of colon polyps   . Hyperlipidemia   . Hypertension   . Ruptured lumbar disc    L4-L5 as a child    Past Surgical History:  Procedure Laterality Date  . CARDIAC CATHETERIZATION    . COLONOSCOPY    . CORONARY STENT INTERVENTION N/A 12/14/2020   Procedure: CORONARY STENT INTERVENTION;  Surgeon: Corky Crafts, MD;  Location: [facility] INVASIVE CV LAB;  Service: Cardiovascular;   Laterality: N/A;  . INTRAVASCULAR ULTRASOUND/IVUS N/A 12/14/2020   Procedure: Intravascular Ultrasound/IVUS;  Surgeon: Corky Crafts, MD;  Location: [facility] INVASIVE CV LAB;  Service: Cardiovascular;  Laterality: N/A;  . LEFT HEART CATH AND CORONARY ANGIOGRAPHY N/A 12/14/2020   Procedure: LEFT HEART CATH AND CORONARY ANGIOGRAPHY;  Surgeon: Corky Crafts, MD;  Location: [facility] INVASIVE CV LAB;  Service: Cardiovascular;  Laterality: N/A;  . NO PAST SURGERIES      Current Medications: No outpatient medications have been marked as taking for the 04/07/21 encounter (Office Visit) with Tonny Bollman, MD.     Allergies:   Patient has no known allergies.   Social History   Socioeconomic History  . Marital status: Married    Spouse name: Not on file  . Number of children: 2  . Years of education: 16  . Highest education level: Bachelor's degree (e.g., BA, AB, BS)  Occupational History  . Occupation: Art gallery manager  Tobacco Use  . Smoking status: Never Smoker  . Smokeless tobacco: Never Used  Vaping Use  . Vaping Use: Never used  Substance and Sexual Activity  . Alcohol use: Yes    Alcohol/week: 0.0 standard drinks    Comment: occasionally   . Drug use: No  . Sexual activity: Yes  Other Topics Concern  . Not on file  Social History Narrative  . Not on file   Social Determinants of Health  Financial Resource Strain: Low Risk   . Difficulty of Paying Living Expenses: Not hard at all  Food Insecurity: No Food Insecurity  . Worried About Programme researcher, broadcasting/film/video in the Last Year: Never true  . Ran Out of Food in the Last Year: Never true  Transportation Needs: No Transportation Needs  . Lack of Transportation (Medical): No  . Lack of Transportation (Non-Medical): No  Physical Activity: Not on file  Stress: Not on file  Social Connections: Not on file     Family History: The patient's family history includes Cancer in his maternal grandmother and mother; Crohn's disease in his  paternal aunt; Diabetes in his paternal grandfather; Heart disease in his father and paternal grandfather; Kidney Stones in his mother. There is no history of Colon cancer, Stomach cancer, Rectal cancer, Esophageal cancer, or Liver cancer.  ROS:   Please see the history of present illness.    All other systems reviewed and are negative.  EKGs/Labs/Other Studies Reviewed:    The following studies were reviewed today: Echo 03/28/2021: IMPRESSIONS    1. Left ventricular ejection fraction, by estimation, is 60 to 65%. The  left ventricle has normal function. The left ventricle has no regional  wall motion abnormalities. There is mild concentric left ventricular  hypertrophy. Left ventricular diastolic  parameters are consistent with Grade I diastolic dysfunction (impaired  relaxation).  2. Right ventricular systolic function is normal. The right ventricular  size is normal.  3. The mitral valve is normal in structure. Trivial mitral valve  regurgitation. No evidence of mitral stenosis.  4. The aortic valve is tricuspid. Aortic valve regurgitation is mild. No  aortic stenosis is present.  5. Aortic dilatation noted. There is mild dilatation of the aortic root,  measuring 39 mm.  6. The inferior vena cava is normal in size with greater than 50%  respiratory variability, suggesting right atrial pressure of 3 mmHg.   Comparison(s): Compared to prior TTE on 12/14/20, the LVEF has improved to  60-65% and the anteroseptal WMA is no longer present.   Cath 12/14/2020: Conclusion    There is moderate to severe left ventricular systolic dysfunction.  LV end diastolic pressure is mildly elevated.  The left ventricular ejection fraction is 25-35% by visual estimate.  There is no aortic valve stenosis.  Dist RCA lesion is 50% stenosed.  2nd Mrg lesion is 50% stenosed.  Mid LAD lesion is 100% stenosed.  A drug-eluting stent was successfully placed using a STENT RESOLUTE ONYX  3.5X30.  Post intervention, there is a 0% residual stenosis.   Pain resolved after PCI.  Sx were intermittent over the prior three days.  Hopefully he will have recovery of LV function with revascularization.  Continue aggressive secondary prevention.  Medical therapy for LV dysfunction.   EKG:  EKG is not ordered today.    Recent Labs: 12/14/2020: B Natriuretic Peptide 67.6; Magnesium 1.8; TSH 1.219 12/15/2020: Hemoglobin 14.6; Platelets 215 01/03/2021: ALT 25 02/28/2021: BUN 16; Creatinine, Ser 1.13; Potassium 4.1; Sodium 137  Recent Lipid Panel    Component Value Date/Time   CHOL 179 12/14/2020 0341   TRIG 345 (H) 12/14/2020 0341   HDL 44 12/14/2020 0341   CHOLHDL 4.1 12/14/2020 0341   VLDL 69 (H) 12/14/2020 0341   LDLCALC 66 12/14/2020 0341   LDLCALC 66 10/04/2020 0826     Risk Assessment/Calculations:       Physical Exam:    VS:  BP 110/62   Pulse 64   Ht  6' (1.829 m)   Wt 206 lb (93.4 kg)   SpO2 98%   BMI 27.94 kg/m     Wt Readings from Last 3 Encounters:  04/07/21 206 lb (93.4 kg)  03/28/21 211 lb 9.6 oz (96 kg)  02/22/21 209 lb 14.1 oz (95.2 kg)     GEN:  Well nourished, well developed in no acute distress HEENT: Normal NECK: No JVD; No carotid bruits LYMPHATICS: No lymphadenopathy CARDIAC: RRR, no murmurs, rubs, gallops RESPIRATORY:  Clear to auscultation without rales, wheezing or rhonchi  ABDOMEN: Soft, non-tender, non-distended MUSCULOSKELETAL:  No edema; No deformity  SKIN: Warm and dry NEUROLOGIC:  Alert and oriented x 3 PSYCHIATRIC:  Normal affect   ASSESSMENT:    1. Ischemic cardiomyopathy   2. Mixed hyperlipidemia    PLAN:    In order of problems listed above:  1. LVEF normalized with revascularization and medical therapy now 3 months out from his MI.  Advised he can stop spironolactone.  He will continue on metoprolol succinate and Entresto at current doses.  He will continue on dual antiplatelet therapy with aspirin and ticagrelor.   Recommend follow-up with a metabolic panel and office visit in 6 months. 2. Treated with a high intensity statin drug.  Last lipids showed an LDL cholesterol of 66.  Continue current therapy.  ALT was 25.  Overall the patient appears to be doing very well on current medications.  He asked about need to continue with metformin.  His last hemoglobin A1c was 8.1.  Blood glucoses now are in a much better range.  Advised that he follow-up with his primary care physician.  He is in the process of changing.  I feel it is important for him to continue on Jardiance for its cardioprotective effect.  He seems to be tolerating low-dose metformin well and probably should continue for the time being.  When he returns in 6 months we will add a hemoglobin A1c to his blood work.  Medication Adjustments/Labs and Tests Ordered: Current medicines are reviewed at length with the patient today.  Concerns regarding medicines are outlined above.  Orders Placed This Encounter  Procedures  . Comprehensive metabolic panel  . Lipid panel   Meds ordered this encounter  Medications  . empagliflozin (JARDIANCE) 10 MG TABS tablet    Sig: Take 1 tablet (10 mg total) by mouth daily before breakfast.    Dispense:  90 tablet    Refill:  3  . icosapent Ethyl (VASCEPA) 1 g capsule    Sig: Take 2 capsules (2 g total) by mouth 2 (two) times daily.    Dispense:  360 capsule    Refill:  3    Patient Instructions  Medication Instructions:  1) STOP SPIRONOLACTONE *If you need a refill on your cardiac medications before your next appointment, please call your pharmacy*  Follow-Up: [facility], you and your health needs are our priority.  As part of our continuing mission to provide you with exceptional heart care, we have created designated Provider Care Teams.  These Care Teams include your primary Cardiologist (physician) and Advanced Practice Providers (APPs -  Physician Assistants and Nurse Practitioners) who all work  together to provide you with the care you need, when you need it. Your next appointment:   6 month(s) The format for your next appointment:   In Person Provider:   You will see one of the following Advanced Practice Providers on your designated Care Team:    Tereso NewcomerScott Weaver, [place]  Vin Bhagat, PA-C Please schedule your fasting labs prior to your appointment.    Signed, Tonny Bollman, MD  04/07/2021 2:52 PM    [facility]

## 2021-04-07 NOTE — Patient Instructions (Signed)
Medication Instructions:  1) STOP SPIRONOLACTONE *If you need a refill on your cardiac medications before your next appointment, please call your pharmacy*  Follow-Up: At [facility], you and your health needs are our priority.  As part of our continuing mission to provide you with exceptional heart care, we have created designated Provider Care Teams.  These Care Teams include your primary Cardiologist (physician) and Advanced Practice Providers (APPs -  Physician Assistants and Nurse Practitioners) who all work together to provide you with the care you need, when you need it. Your next appointment:   6 month(s) The format for your next appointment:   In Person Provider:   You will see one of the following Advanced Practice Providers on your designated Care Team:    Tereso Newcomer, PA-C  Chelsea Aus, [place] Please schedule your fasting labs prior to your appointment.

## 2021-04-08 ENCOUNTER — Encounter (HOSPITAL_COMMUNITY): Payer: 59

## 2021-04-13 ENCOUNTER — Encounter (HOSPITAL_COMMUNITY): Payer: 59

## 2021-04-15 ENCOUNTER — Encounter (HOSPITAL_COMMUNITY): Payer: 59

## 2021-04-17 ENCOUNTER — Other Ambulatory Visit: Payer: Self-pay | Admitting: Cardiology

## 2021-04-18 ENCOUNTER — Encounter (HOSPITAL_COMMUNITY): Payer: 59

## 2021-04-20 ENCOUNTER — Encounter (HOSPITAL_COMMUNITY): Payer: 59

## 2021-04-22 ENCOUNTER — Encounter (HOSPITAL_COMMUNITY): Payer: 59

## 2021-04-29 ENCOUNTER — Other Ambulatory Visit: Payer: Self-pay | Admitting: Cardiology

## 2021-05-12 ENCOUNTER — Other Ambulatory Visit: Payer: Self-pay | Admitting: Cardiology

## 2021-05-13 MED ORDER — TICAGRELOR 90 MG PO TABS: 90.0000 mg | ORAL_TABLET | Freq: Two times a day (BID) | ORAL | 3 refills | Status: DC

## 2021-05-23 ENCOUNTER — Other Ambulatory Visit (HOSPITAL_COMMUNITY): Payer: 59

## 2021-05-23 ENCOUNTER — Encounter (HOSPITAL_COMMUNITY): Payer: 59 | Admitting: Internal Medicine

## 2021-06-03 ENCOUNTER — Encounter: Payer: Self-pay | Admitting: *Deleted

## 2021-06-03 DIAGNOSIS — Z006 Encounter for examination for normal comparison and control in clinical research program: Secondary | ICD-10-CM

## 2021-06-03 NOTE — Research (Signed)
AEGIS Visit 9 Phone Call per COVID precautions  Patient is doing well, no complaints.  Last visit with Dr Excell Seltzer in May there was a medication change.  Patient was taking off of Spirolactone.  Patinet stated that his diabetes is doing fine.  No extreme high or low readings. Instructed patient that his next visit is number 10 and that it is a phone visit and we will call him for that follow-up  Current weight given verbally from patient 201.9lb    Lifestyle Adherence Assessment:    YES NO  Abstinence from smoking/remaining tobacco free X   Cardiac Diet X   Routine physical activity and/or cardiac rehabilitation X                                         "CONSENT"   YES     NO   Continuing further Investigational Product and study visits for follow-up? [x]  []   Continuing consent from future biomedical research [x]  []                                      "EVENTS"    YES     NO  AE   (IF YES SEE SOURCE) []  [x]   SAE  (IF YES SEE SOURCE) []  [x]   ENDPOINT   (IF YES SEE SOURCE) []  [x]   REVASCULARIZATION  (IF YES SEE SOURCE) []  [x]   AMPUTATION   (IF YES SEE SOURCE) []  [x]   TROPONIN'S  (IF YES SEE SOURCE) []  [x]    Outpatient Encounter Medications as of 06/03/2021  Medication Sig   aspirin 81 MG chewable tablet CHEW AND SWALLOW 1 TABLET  BY MOUTH DAILY   atorvastatin (LIPITOR) 80 MG tablet TAKE 1 TABLET BY MOUTH  DAILY   empagliflozin (JARDIANCE) 10 MG TABS tablet Take 1 tablet (10 mg total) by mouth daily before breakfast.   Glucose Blood (BLOOD GLUCOSE TEST STRIPS) STRP Please dispense based on patient and insurance preference. Use as directed to monitor FSBS 1x daily. Dx: E11.9.   icosapent Ethyl (VASCEPA) 1 g capsule Take 2 capsules (2 g total) by mouth 2 (two) times daily.   metFORMIN (GLUCOPHAGE) 1000 MG tablet Take 0.5 tablets (500 mg total) by mouth 2 (two) times daily with a meal.   metoprolol succinate (TOPROL-XL) 25 MG 24 hr tablet TAKE 1 TABLET BY MOUTH  DAILY   nitroGLYCERIN  (NITROSTAT) 0.4 MG SL tablet DISSOLVE 1 TABLET UNDER THE TONGUE EVERY 5 MINUTES AS  NEEDED FOR CHEST PAIN. MAX  OF 3 TABLETS IN 15 MINUTES. CALL 911 IF PAIN PERSISTS.   sacubitril-valsartan (ENTRESTO) 97-103 MG Take 1 tablet by mouth 2 (two) times daily.   ticagrelor (BRILINTA) 90 MG TABS tablet Take 1 tablet (90 mg total) by mouth 2 (two) times daily.   No facility-administered encounter medications on file as of 06/03/2021.

## 2021-06-10 ENCOUNTER — Telehealth: Payer: Self-pay | Admitting: *Deleted

## 2021-06-10 DIAGNOSIS — Z006 Encounter for examination for normal comparison and control in clinical research program: Secondary | ICD-10-CM

## 2021-06-14 NOTE — Telephone Encounter (Signed)
AEGIS Visit 9 completed

## 2021-06-17 MED ORDER — TICAGRELOR 90 MG PO TABS: 90.0000 mg | ORAL_TABLET | Freq: Two times a day (BID) | ORAL | 1 refills | Status: DC

## 2021-07-11 IMAGING — CR DG CHEST 2V
2 series · 2 of 2 positions shown · non-contrast
Comparison: None.

CLINICAL DATA: Chest pain for several days

EXAM:
CHEST - 2 VIEW

[chest pa]
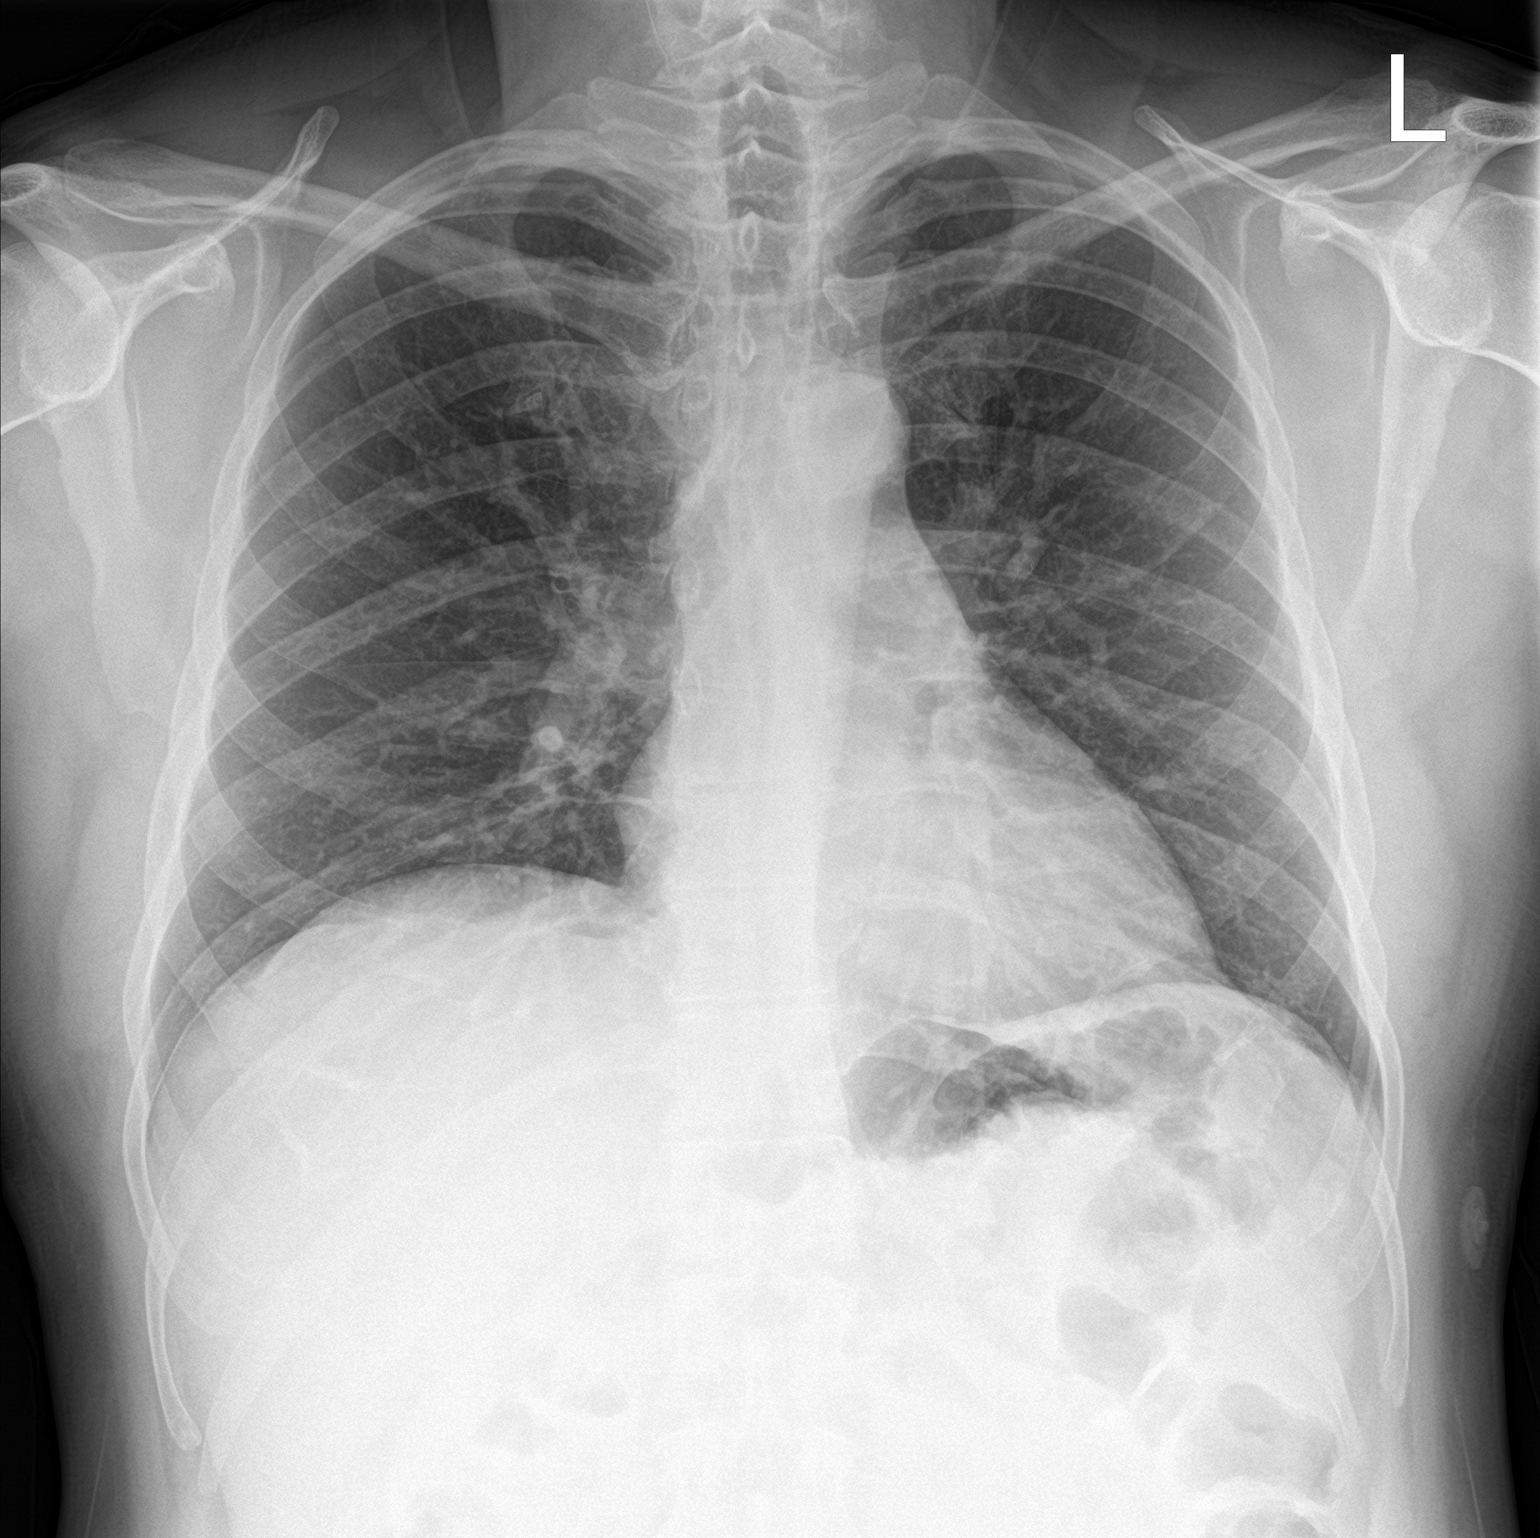

[chest lat]
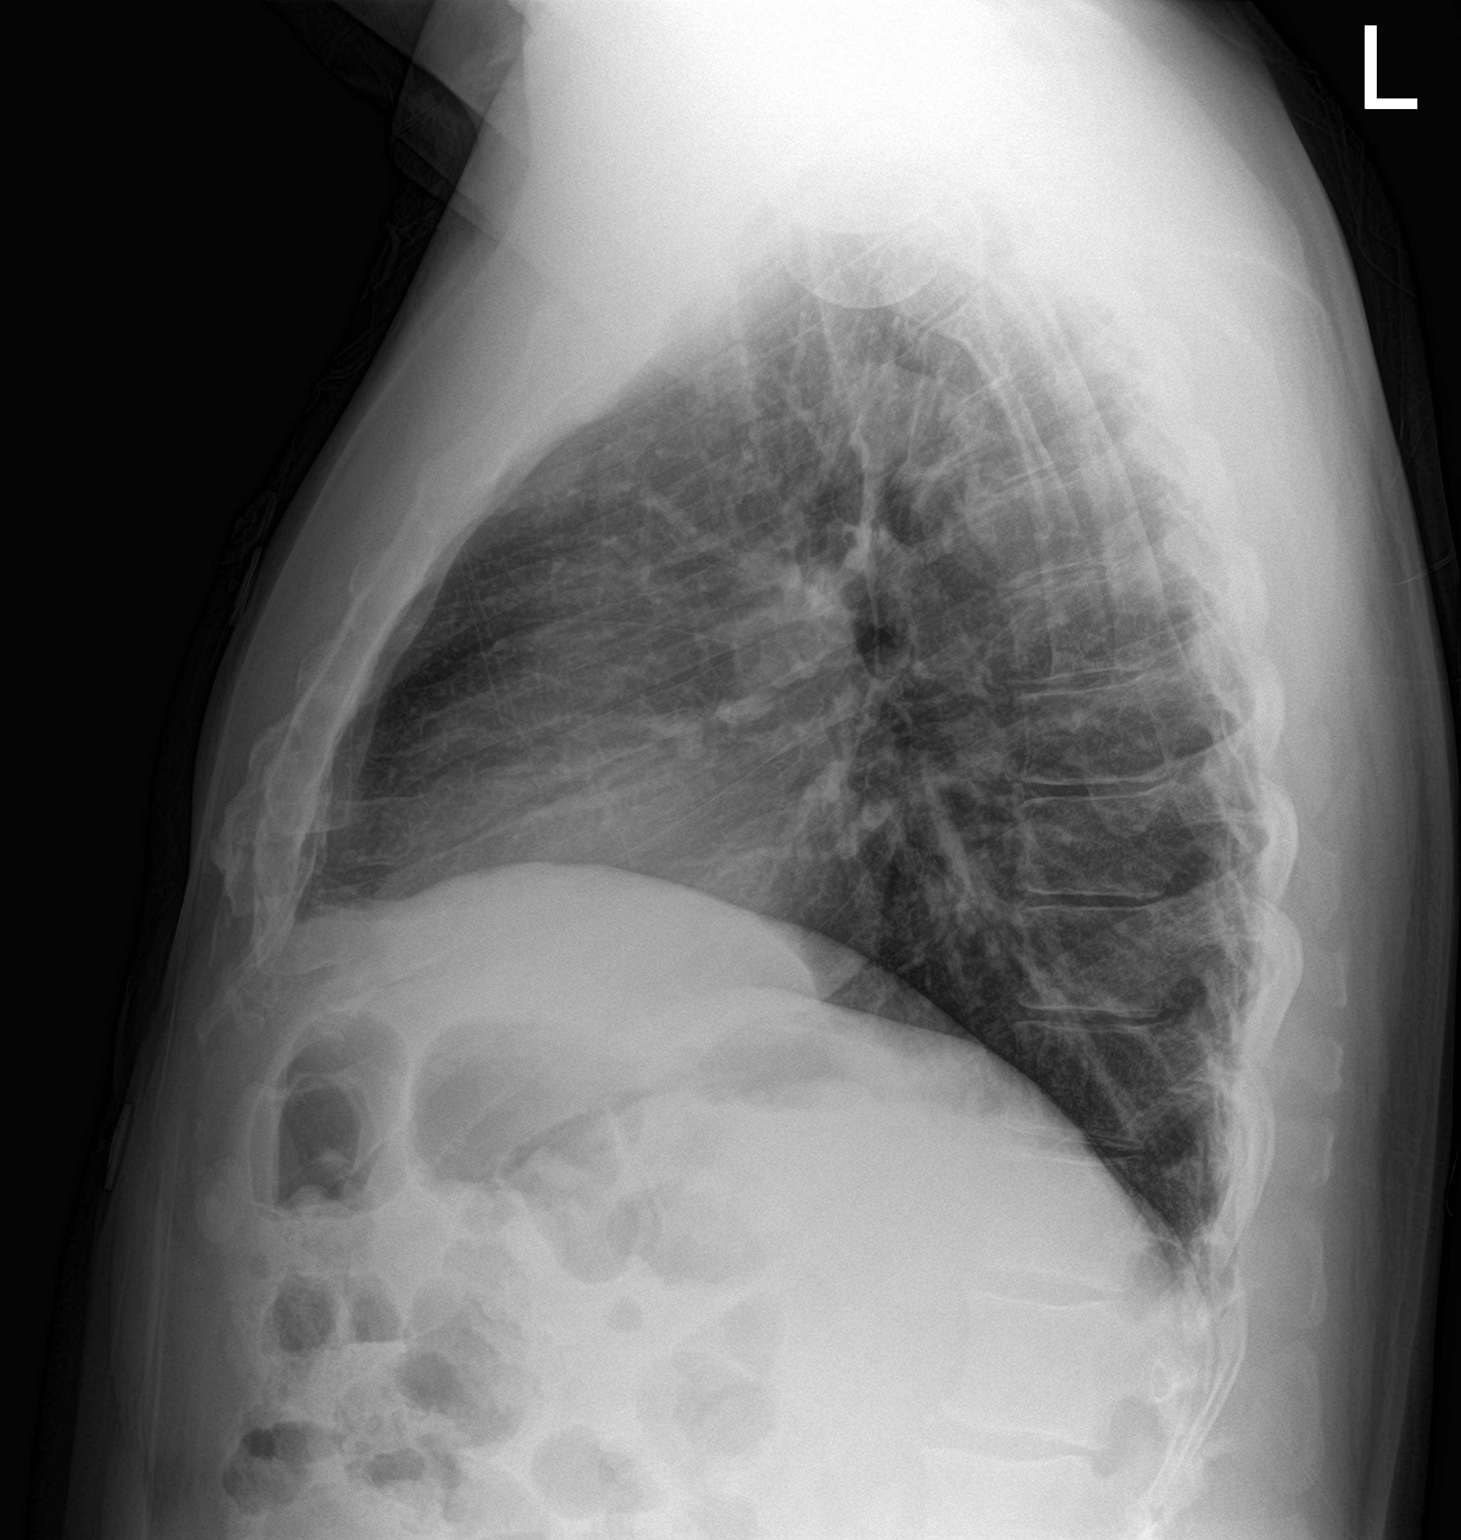

[2 of 2 positions shown; findings below may reference images not displayed]

FINDINGS: The heart size and mediastinal contours are within normal limits.
Both lungs are clear. The visualized skeletal structures are
unremarkable.
IMPRESSION: No active cardiopulmonary disease.

## 2021-07-30 ENCOUNTER — Other Ambulatory Visit: Payer: Self-pay | Admitting: Cardiovascular Disease

## 2021-08-10 ENCOUNTER — Other Ambulatory Visit: Payer: Self-pay | Admitting: Cardiology

## 2021-08-31 ENCOUNTER — Encounter: Payer: Self-pay | Admitting: *Deleted

## 2021-08-31 DIAGNOSIS — Z006 Encounter for examination for normal comparison and control in clinical research program: Secondary | ICD-10-CM

## 2021-08-31 NOTE — Research (Signed)
AEGIS Visit 10 Phone Visit   Patient stated that he is doing well with no complaints or chest pain.  Patient stated he is following a healthy diet and exercise regimen.  He has not had any medication changes since last visit. Patient stated that his diabetes seems to be controlled with no high or low readings.    Most recent verbal weight 202.6lbs                                      "CONSENT"   YES     NO   Continuing further Investigational Product and study visits for follow-up? [x]  []   Continuing consent from future biomedical research [x]  []                                      "EVENTS"    YES     NO  AE   (IF YES SEE SOURCE) []  [x]   SAE  (IF YES SEE SOURCE) []  [x]   ENDPOINT   (IF YES SEE SOURCE) []  [x]   REVASCULARIZATION  (IF YES SEE SOURCE) []  [x]   AMPUTATION   (IF YES SEE SOURCE) []  [x]   TROPONIN'S  (IF YES SEE SOURCE) []  [x]     Lifestyle Adherence Assessment:    YES NO  Abstinence from smoking/remaining tobacco free X   Cardiac Diet X   Routine physical activity and/or cardiac rehabilitation X      Allergies as of 08/31/2021   No Known Allergies      Medication List        Accurate as of August 31, 2021 12:39 PM. If you have any questions, ask your nurse or doctor.          aspirin 81 MG chewable tablet CHEW AND SWALLOW 1 TABLET  BY MOUTH DAILY   atorvastatin 80 MG tablet Commonly known as: LIPITOR TAKE 1 TABLET BY MOUTH  DAILY   BLOOD GLUCOSE TEST STRIPS Strp Please dispense based on patient and insurance preference. Use as directed to monitor FSBS 1x daily. Dx: E11.9.   empagliflozin 10 MG Tabs tablet Commonly known as: Jardiance Take 1 tablet (10 mg total) by mouth daily before breakfast.   Entresto 97-103 MG Generic drug: sacubitril-valsartan Take 1 tablet by mouth 2 (two) times daily.   icosapent Ethyl 1 g capsule Commonly known as: VASCEPA TAKE 2 CAPSULES BY MOUTH 2 TIMES DAILY.   metFORMIN 1000 MG tablet Commonly known as:  GLUCOPHAGE Take 0.5 tablets (500 mg total) by mouth 2 (two) times daily with a meal.   metoprolol succinate 25 MG 24 hr tablet Commonly known as: TOPROL-XL TAKE 1 TABLET BY MOUTH  DAILY   nitroGLYCERIN 0.4 MG SL tablet Commonly known as: NITROSTAT DISSOLVE 1 TABLET UNDER THE TONGUE EVERY 5 MINUTES AS  NEEDED FOR CHEST PAIN. MAX  OF 3 TABLETS IN 15 MINUTES. CALL 911 IF PAIN PERSISTS.   ticagrelor 90 MG Tabs tablet Commonly known as: BRILINTA Take 1 tablet (90 mg total) by mouth 2 (two) times daily.

## 2021-10-04 ENCOUNTER — Other Ambulatory Visit: Payer: 59 | Admitting: *Deleted

## 2021-10-04 ENCOUNTER — Encounter: Payer: Self-pay | Admitting: Cardiovascular Disease

## 2021-10-04 ENCOUNTER — Other Ambulatory Visit: Payer: Self-pay

## 2021-10-04 ENCOUNTER — Other Ambulatory Visit (HOSPITAL_COMMUNITY): Payer: Self-pay | Admitting: *Deleted

## 2021-10-04 DIAGNOSIS — I255 Ischemic cardiomyopathy: Secondary | ICD-10-CM

## 2021-10-04 DIAGNOSIS — E782 Mixed hyperlipidemia: Secondary | ICD-10-CM

## 2021-10-04 LAB — COMPREHENSIVE METABOLIC PANEL
ALT: 19 IU/L (ref 0–44)
AST: 16 IU/L (ref 0–40)
Albumin/Globulin Ratio: 2.6 — ABNORMAL HIGH (ref 1.2–2.2)
Albumin: 4.9 g/dL (ref 4.0–5.0)
Alkaline Phosphatase: 46 IU/L (ref 44–121)
BUN/Creatinine Ratio: 15 (ref 9–20)
BUN: 18 mg/dL (ref 6–24)
Bilirubin Total: 0.9 mg/dL (ref 0.0–1.2)
CO2: 23 mmol/L (ref 20–29)
Calcium: 9.5 mg/dL (ref 8.7–10.2)
Chloride: 103 mmol/L (ref 96–106)
Creatinine, Ser: 1.17 mg/dL (ref 0.76–1.27)
Globulin, Total: 1.9 g/dL (ref 1.5–4.5)
Glucose: 119 mg/dL — ABNORMAL HIGH (ref 70–99)
Potassium: 4.4 mmol/L (ref 3.5–5.2)
Sodium: 140 mmol/L (ref 134–144)
Total Protein: 6.8 g/dL (ref 6.0–8.5)
eGFR: 76 mL/min/{1.73_m2} (ref >59)

## 2021-10-04 LAB — LIPID PANEL
Chol/HDL Ratio: 2.6 ratio (ref 0.0–5.0)
Cholesterol, Total: 109 mg/dL (ref 100–199)
HDL: 42 mg/dL (ref >39)
LDL Chol Calc (NIH): 49 mg/dL (ref 0–99)
Triglycerides: 95 mg/dL (ref 0–149)
VLDL Cholesterol Cal: 18 mg/dL (ref 5–40)

## 2021-10-04 LAB — HEMOGLOBIN A1C
Est. average glucose Bld gHb Est-mCnc: 137 mg/dL
Hgb A1c MFr Bld: 6.4 % — ABNORMAL HIGH (ref 4.8–5.6)

## 2021-10-08 ENCOUNTER — Other Ambulatory Visit: Payer: Self-pay | Admitting: Cardiovascular Disease

## 2021-10-10 ENCOUNTER — Encounter (HOSPITAL_BASED_OUTPATIENT_CLINIC_OR_DEPARTMENT_OTHER): Payer: Self-pay | Admitting: Nurse Practitioner

## 2021-10-10 ENCOUNTER — Encounter (HOSPITAL_BASED_OUTPATIENT_CLINIC_OR_DEPARTMENT_OTHER): Payer: Self-pay

## 2021-10-10 ENCOUNTER — Other Ambulatory Visit: Payer: Self-pay | Admitting: Cardiology

## 2021-10-10 ENCOUNTER — Ambulatory Visit (INDEPENDENT_AMBULATORY_CARE_PROVIDER_SITE_OTHER): Payer: 59 | Admitting: Nurse Practitioner

## 2021-10-10 ENCOUNTER — Other Ambulatory Visit: Payer: Self-pay

## 2021-10-10 VITALS — BP 90/68 | HR 60 | Ht 72.0 in | Wt 212.0 lb

## 2021-10-10 DIAGNOSIS — I214 Non-ST elevation (NSTEMI) myocardial infarction: Secondary | ICD-10-CM | POA: Diagnosis not present

## 2021-10-10 DIAGNOSIS — E785 Hyperlipidemia, unspecified: Secondary | ICD-10-CM | POA: Diagnosis not present

## 2021-10-10 DIAGNOSIS — I2510 Atherosclerotic heart disease of native coronary artery without angina pectoris: Secondary | ICD-10-CM

## 2021-10-10 DIAGNOSIS — E119 Type 2 diabetes mellitus without complications: Secondary | ICD-10-CM | POA: Diagnosis not present

## 2021-10-10 MED ORDER — METOPROLOL SUCCINATE ER 25 MG PO TB24: 25.0000 mg | ORAL_TABLET | Freq: Every day | ORAL | 3 refills | Status: DC

## 2021-10-10 MED ORDER — ENTRESTO 97-103 MG PO TABS
1.0000 | ORAL_TABLET | Freq: Two times a day (BID) | ORAL | 3 refills | Status: DC
Start: 2021-10-10 — End: 2022-10-26

## 2021-10-10 NOTE — Progress Notes (Signed)
Cardiology Office Note:    Date:  10/10/2021   ID:  James Francis, DOB [birth year], MRN [id number]  PCP:  Aviva Kluver   [facility] Providers Cardiologist:  Tonny Bollman, MD     Referring MD: No ref. provider found   Chief complaint: 6 month follow-up of CAD  History of Present Illness:    James Francis is a 50 y.o. male with a hx of NSTEMI, CAD s/p DES mLAD 2/22, DM, hyperlipidemia, and HFrEF. He was previously evaluated for syncope that was felt to be orthostasis after exercise in July 2017 by Dr. Elberta Fortis. In February 2022, he presented to the ED with a 3 day history of burning chest pain that radiated to both arms that was worse with exertion. Cardiac cath revealed 100% stenosis in mLAD, treated w/DES,  50% stenosis dRCA, and 50% stenosis 2nd marginal. Initial echo post coronary intervention showed LVEF 35-40%, with improvement to 60-65% by echo on 03/28/21. He was last seen in our office on 04/07/21 by Dr. Excell Seltzer and was advised to d/c spironolactone due to soft BP and to follow-up in 6 months. He was advised to continue DAPT at that time.   He is here alone today for 6 month follow-up. He denies chest pain, shortness of breath, lower extremity edema, fatigue, palpitations, melena, hematuria, hemoptysis, diaphoresis, weakness, presyncope, syncope, orthopnea, and PND. He is weighing himself daily and notes a normal variation of 5 lb weight gain in one week on occasion. Monitoring sodium and following heart healthy diet.   Past Medical History:  Diagnosis Date   CHF (congestive heart failure) (HCC)    Colitis    Indeterminate cause   Coronary artery disease    Diabetes mellitus without complication (HCC)    Hearing loss    High frequency bilateral   History of broken nose    Multiple- has deviated septum   History of colon polyps    Hyperlipidemia    Hypertension    Ruptured lumbar disc    L4-L5 as a child    Past Surgical History:  Procedure Laterality Date   CARDIAC  CATHETERIZATION     COLONOSCOPY     CORONARY STENT INTERVENTION N/A 12/14/2020   Procedure: CORONARY STENT INTERVENTION;  Surgeon: Corky Crafts, MD;  Location: [facility] INVASIVE CV LAB;  Service: Cardiovascular;  Laterality: N/A;   INTRAVASCULAR ULTRASOUND/IVUS N/A 12/14/2020   Procedure: Intravascular Ultrasound/IVUS;  Surgeon: Corky Crafts, MD;  Location: [facility] INVASIVE CV LAB;  Service: Cardiovascular;  Laterality: N/A;   LEFT HEART CATH AND CORONARY ANGIOGRAPHY N/A 12/14/2020   Procedure: LEFT HEART CATH AND CORONARY ANGIOGRAPHY;  Surgeon: Corky Crafts, MD;  Location: [facility] INVASIVE CV LAB;  Service: Cardiovascular;  Laterality: N/A;   NO PAST SURGERIES      Current Medications: Current Meds  Medication Sig   aspirin 81 MG chewable tablet CHEW AND SWALLOW 1 TABLET  BY MOUTH DAILY   atorvastatin (LIPITOR) 80 MG tablet TAKE 1 TABLET BY MOUTH  DAILY   empagliflozin (JARDIANCE) 10 MG TABS tablet Take 1 tablet (10 mg total) by mouth daily before breakfast.   Glucose Blood (BLOOD GLUCOSE TEST STRIPS) STRP Please dispense based on patient and insurance preference. Use as directed to monitor FSBS 1x daily. Dx: E11.9.   icosapent Ethyl (VASCEPA) 1 g capsule TAKE 2 CAPSULES BY MOUTH 2 TIMES DAILY.   metFORMIN (GLUCOPHAGE) 1000 MG tablet Take 0.5 tablets (500 mg total) by mouth 2 (two) times daily with a meal.  nitroGLYCERIN (NITROSTAT) 0.4 MG SL tablet DISSOLVE 1 TABLET UNDER THE TONGUE EVERY 5 MINUTES AS  NEEDED FOR CHEST PAIN. MAX  OF 3 TABLETS IN 15 MINUTES. CALL 911 IF PAIN PERSISTS.   ticagrelor (BRILINTA) 90 MG TABS tablet Take 1 tablet (90 mg total) by mouth 2 (two) times daily.   [DISCONTINUED] metoprolol succinate (TOPROL-XL) 25 MG 24 hr tablet TAKE 1 TABLET BY MOUTH  DAILY   [DISCONTINUED] sacubitril-valsartan (ENTRESTO) 97-103 MG Take 1 tablet by mouth 2 (two) times daily.     Allergies:   Patient has no known allergies.   Social History   Socioeconomic History   Marital  status: Married    Spouse name: Not on file   Number of children: 2   Years of education: 16   Highest education level: Bachelor's degree (e.g., BA, AB, BS)  Occupational History   Occupation: Chief Financial Officer  Tobacco Use   Smoking status: Never   Smokeless tobacco: Never  Vaping Use   Vaping Use: Never used  Substance and Sexual Activity   Alcohol use: Yes    Alcohol/week: 0.0 standard drinks    Comment: occasionally    Drug use: No   Sexual activity: Yes  Other Topics Concern   Not on file  Social History Narrative   Not on file   Social Determinants of Health   Financial Resource Strain: Low Risk    Difficulty of Paying Living Expenses: Not hard at all  Food Insecurity: No Food Insecurity   Worried About Charity fundraiser in the Last Year: Never true   [place] in the Last Year: Never true  Transportation Needs: No Transportation Needs   Lack of Transportation (Medical): No   Lack of Transportation (Non-Medical): No  Physical Activity: Not on file  Stress: Not on file  Social Connections: Not on file     Family History: The patient's family history includes Cancer in his maternal grandmother and mother; Crohn's disease in his paternal aunt; Diabetes in his paternal grandfather; Heart disease in his father and paternal grandfather; Kidney Stones in his mother. There is no history of Colon cancer, Stomach cancer, Rectal cancer, Esophageal cancer, or Liver cancer.  ROS:   Please see the history of present illness. Mild non-pitting bilateral lower extremity edema. All other systems reviewed and are negative.  Labs/Other Studies Reviewed:    The following studies were reviewed today:  Echo 03/28/21  Left Ventricle: Left ventricular ejection fraction, by estimation, is 60  to 65%. The left ventricle has normal function. The left ventricle has no  regional wall motion abnormalities. The left ventricular internal cavity  size was normal in size. There is   mild  concentric left ventricular hypertrophy. Left ventricular diastolic  parameters are consistent with Grade I diastolic dysfunction (impaired  relaxation). Normal left ventricular filling pressure.  Right Ventricle: The right ventricular size is normal. No increase in  right ventricular wall thickness. Right ventricular systolic function is  normal.  Left Atrium: Left atrial size was normal in size.  Right Atrium: Right atrial size was normal in size.  Pericardium: There is no evidence of pericardial effusion.  Mitral Valve: The mitral valve is normal in structure. Trivial mitral  valve regurgitation. No evidence of mitral valve stenosis.  Tricuspid Valve: The tricuspid valve is normal in structure. Tricuspid  valve regurgitation is trivial.  Aortic Valve: The aortic valve is tricuspid. Aortic valve regurgitation is  mild. Aortic regurgitation PHT measures 518 msec. No aortic  stenosis is  present.  Pulmonic Valve: The pulmonic valve was normal in structure. Pulmonic valve  regurgitation is trivial.  Aorta: Aortic dilatation noted. There is mild dilatation of the aortic  root, measuring 39 mm.  Venous: The inferior vena cava is normal in size with greater than 50%  respiratory variability, suggesting right atrial pressure of 3 mmHg.    LHC 12/14/20  There is moderate to severe left ventricular systolic dysfunction. LV end diastolic pressure is mildly elevated. The left ventricular ejection fraction is 25-35% by visual estimate. There is no aortic valve stenosis. Dist RCA lesion is 50% stenosed. 2nd Mrg lesion is 50% stenosed. Mid LAD lesion is 100% stenosed. A drug-eluting stent was successfully placed using a STENT RESOLUTE ONYX 3.5X30. Post intervention, there is a 0% residual stenosis.   Pain resolved after PCI.  Sx were intermittent over the prior three days.  Hopefully he will have recovery of LV function with revascularization.  Continue aggressive secondary prevention.  Medical  therapy for LV dysfunction.   Recent Labs: 12/14/2020: B Natriuretic Peptide 67.6; Magnesium 1.8; TSH 1.219 12/15/2020: Hemoglobin 14.6; Platelets 215 10/04/2021: ALT 19; BUN 18; Creatinine, Ser 1.17; Potassium 4.4; Sodium 140   Recent Lipid Panel    Component Value Date/Time   CHOL 109 10/04/2021 0818   TRIG 95 10/04/2021 0818   HDL 42 10/04/2021 0818   CHOLHDL 2.6 10/04/2021 0818   CHOLHDL 4.1 12/14/2020 0341   VLDL 69 (H) 12/14/2020 0341   LDLCALC 49 10/04/2021 0818   LDLCALC 66 10/04/2020 0826    Physical Exam:    VS:  BP 90/68   Pulse 60   Ht 6' (1.829 m)   Wt 212 lb (96.2 kg)   BMI 28.75 kg/m     Wt Readings from Last 3 Encounters:  10/10/21 212 lb (96.2 kg)  06/14/21 201 lb 14.4 oz (91.6 kg)  04/07/21 206 lb (93.4 kg)    GEN:  Well nourished, well developed in no acute distress HEENT: Normal NECK: No JVD; No carotid bruits LYMPHATICS: No lymphadenopathy CARDIAC: RRR, no murmurs, rubs, gallops RESPIRATORY:  Clear to auscultation without rales, wheezing or rhonchi  ABDOMEN: Soft, non-tender, non-distended MUSCULOSKELETAL:  No edema; No deformity  SKIN: Warm and dry NEUROLOGIC:  Alert and oriented x 3 PSYCHIATRIC:  Normal affect   EKG:  EKG is ordered today.  The ekg ordered today demonstrates NSR at 60 bpm with TWI V1, no significant change from previous.   Diagnoses:    1. Coronary artery disease involving native coronary artery of native heart without angina pectoris   2. NSTEMI (non-ST elevated myocardial infarction) ([place])   3. Hyperlipidemia LDL goal <70   4. Diabetes mellitus without complication ([place])    Assessment and Plan:    CAD native without angina: Denies chest pain or activity intolerance. He is post DES to [place] 2/22, on DAPT with Brilinta and ASA. Denies bleeding concerns except with minor skin injuries that occur doing yard work. Advised that I will reach out to Dr. Burt Knack, primary cardiologist for advice regarding continuing DAPT beyond Feb.  2023. Tolerating activity without difficulty. Continue asa, Brilinta, statin, beta blocker. Encourage exercise recommendations of 150 minutes per week and heart healthy diet. Per Dr. Burt Knack, plan to d/c Brilinta in February 2023 and continue aspirin 81 mg indefinitely.  HFrEF: LVEF improved to 60-65% by echo 5/22. Tolerating GDMT including highest dose Entresto, SGLT2-I, beta blocker. Previously on spironolactone but it was discontinued 5/22 due to soft BP. He  reports 5 lb weight fluctuation within a period of a week on a consistent basis. Advised him to monitor sodium intake and weight carefully and to call if weight increases > 5 lb in 1 week or if SOB or edema increase. Has mild non-pitting bilateral lower extremity edema, otherwise appears euvolemic. Continue current medications.   Hyperlipidemia LDL goal < 70: LDL 49 on 10/04/21. Continue atorvastatin, Vascepa.   Diabetes Mellitus: On GDMT metformin and SGLT2-I with A1C of 6.4 on 10/04/21. He has no concerns today. He needs to establish with PCP for further management. Continue current medications.    Medication Adjustments/Labs and Tests Ordered: Current medicines are reviewed at length with the patient today.  Concerns regarding medicines are outlined above.  Orders Placed This Encounter  Procedures   EKG 12-Lead    Meds ordered this encounter  Medications   metoprolol succinate (TOPROL-XL) 25 MG 24 hr tablet    Sig: Take 1 tablet (25 mg total) by mouth daily.    Dispense:  90 tablet    Refill:  3    Requesting 1 year supply   sacubitril-valsartan (ENTRESTO) 97-103 MG    Sig: Take 1 tablet by mouth 2 (two) times daily.    Dispense:  180 tablet    Refill:  3     Patient Instructions  Medication Instructions:  Your Physician recommend you continue on your current medication as directed.    You care team is reaching out to Dr. Burt Knack for any further medication changes and will let you know via mychart.  *If you need a refill  on your cardiac medications before your next appointment, please call your pharmacy*   Lab Work: None ordered today   Testing/Procedures: None ordered today    Follow-Up: At [facility], you and your health needs are our priority.  As part of our continuing mission to provide you with exceptional heart care, we have created designated Provider Care Teams.  These Care Teams include your primary Cardiologist (physician) and Advanced Practice Providers (APPs -  Physician Assistants and Nurse Practitioners) who all work together to provide you with the care you need, when you need it.  We recommend signing up for the patient portal called "MyChart".  Sign up information is provided on this After Visit Summary.  MyChart is used to connect with patients for Virtual Visits (Telemedicine).  Patients are able to view lab/test results, encounter notes, upcoming appointments, etc.  Non-urgent messages can be sent to your provider as well.   To learn more about what you can do with MyChart, go to [email or web address].    Your next appointment:   6 month(s)  The format for your next appointment:   In Person  Provider:   Sherren Mocha, MD  or APPs    Signed, Emmaline Life, NP  10/10/2021 11:07 AM    [place]

## 2021-10-10 NOTE — Patient Instructions (Signed)
Medication Instructions:  Your Physician recommend you continue on your current medication as directed.    You care team is reaching out to Dr. Excell Seltzer for any further medication changes and will let you know via mychart.  *If you need a refill on your cardiac medications before your next appointment, please call your pharmacy*   Lab Work: None ordered today   Testing/Procedures: None ordered today    Follow-Up: At [facility], you and your health needs are our priority.  As part of our continuing mission to provide you with exceptional heart care, we have created designated Provider Care Teams.  These Care Teams include your primary Cardiologist (physician) and Advanced Practice Providers (APPs -  Physician Assistants and Nurse Practitioners) who all work together to provide you with the care you need, when you need it.  We recommend signing up for the patient portal called "MyChart".  Sign up information is provided on this After Visit Summary.  MyChart is used to connect with patients for Virtual Visits (Telemedicine).  Patients are able to view lab/test results, encounter notes, upcoming appointments, etc.  Non-urgent messages can be sent to your provider as well.   To learn more about what you can do with MyChart, go to [email or web address].    Your next appointment:   6 month(s)  The format for your next appointment:   In Person  Provider:   Tonny Bollman, MD  or APPs

## 2021-11-05 ENCOUNTER — Other Ambulatory Visit: Payer: Self-pay | Admitting: Cardiology

## 2021-11-08 MED ORDER — EMPAGLIFLOZIN 10 MG PO TABS: 10.0000 mg | ORAL_TABLET | Freq: Every day | ORAL | 3 refills | Status: DC

## 2021-12-14 ENCOUNTER — Other Ambulatory Visit: Payer: Self-pay

## 2021-12-15 ENCOUNTER — Other Ambulatory Visit: Payer: Self-pay

## 2021-12-20 ENCOUNTER — Encounter: Payer: Self-pay | Admitting: *Deleted

## 2021-12-20 DIAGNOSIS — Z006 Encounter for examination for normal comparison and control in clinical research program: Secondary | ICD-10-CM

## 2021-12-20 NOTE — Research (Signed)
Attempted to reach patient by phone to discuss his End of Study visit.  Left voicemail on 12/19/2021.  Attempted call again on 2/7.  Will continue to attempt to reach patient

## 2021-12-21 ENCOUNTER — Encounter: Payer: Self-pay | Admitting: *Deleted

## 2021-12-21 DIAGNOSIS — Z006 Encounter for examination for normal comparison and control in clinical research program: Secondary | ICD-10-CM

## 2021-12-21 NOTE — Research (Signed)
AEGIS Visit 11 Patient doing well, no complaints of chest pains or shortness of breath.  Thanked him for being in the study.                                     "CONSENT"   YES     NO   Continuing further Investigational Product and study visits for follow-up? [x]  []   Continuing consent from future biomedical research [x]  []                                      "EVENTS"    YES     NO  AE   (IF YES SEE SOURCE) []  [x]   SAE  (IF YES SEE SOURCE) []  [x]   ENDPOINT   (IF YES SEE SOURCE) []  [x]   REVASCULARIZATION  (IF YES SEE SOURCE) []  [x]   AMPUTATION   (IF YES SEE SOURCE) []  [x]   TROPONIN'S  (IF YES SEE SOURCE) []  [x]    Lifestyle Adherence Assessment:   YES NO  Abstinence from smoking/remaining tobacco free X   Cardiac Diet X   Routine physical activity and/or cardiac rehabilitation X     Current Outpatient Medications:    aspirin 81 MG chewable tablet, CHEW AND SWALLOW 1 TABLET  BY MOUTH DAILY, Disp: 30 tablet, Rfl: 11   atorvastatin (LIPITOR) 80 MG tablet, TAKE 1 TABLET BY MOUTH  DAILY, Disp: 90 tablet, Rfl: 3   empagliflozin (JARDIANCE) 10 MG TABS tablet, Take 1 tablet (10 mg total) by mouth daily before breakfast., Disp: 90 tablet, Rfl: 3   Glucose Blood (BLOOD GLUCOSE TEST STRIPS) STRP, Please dispense based on patient and insurance preference. Use as directed to monitor FSBS 1x daily. Dx: E11.9., Disp: 100 each, Rfl: 1   icosapent Ethyl (VASCEPA) 1 g capsule, TAKE 2 CAPSULES BY MOUTH 2 TIMES DAILY., Disp: 360 capsule, Rfl: 2   metFORMIN (GLUCOPHAGE) 1000 MG tablet, Take 0.5 tablets (500 mg total) by mouth 2 (two) times daily with a meal., Disp: 90 tablet, Rfl: 3   metoprolol succinate (TOPROL-XL) 25 MG 24 hr tablet, Take 1 tablet (25 mg total) by mouth daily., Disp: 90 tablet, Rfl: 3   nitroGLYCERIN (NITROSTAT) 0.4 MG SL tablet, DISSOLVE 1 TABLET UNDER THE TONGUE EVERY 5 MINUTES AS  NEEDED FOR CHEST PAIN. MAX  OF 3 TABLETS IN 15 MINUTES. CALL 911 IF PAIN PERSISTS., Disp: 75 tablet,  Rfl: 2   sacubitril-valsartan (ENTRESTO) 97-103 MG, Take 1 tablet by mouth 2 (two) times daily., Disp: 180 tablet, Rfl: 3   ticagrelor (BRILINTA) 90 MG TABS tablet, Take 1 tablet (90 mg total) by mouth 2 (two) times daily., Disp: 180 tablet, Rfl: 1

## 2022-03-09 ENCOUNTER — Other Ambulatory Visit: Payer: Self-pay | Admitting: Cardiovascular Disease

## 2022-04-27 ENCOUNTER — Other Ambulatory Visit: Payer: Self-pay | Admitting: Cardiovascular Disease

## 2022-04-27 MED ORDER — TICAGRELOR 90 MG PO TABS: 90.0000 mg | ORAL_TABLET | Freq: Two times a day (BID) | ORAL | 0 refills | Status: DC

## 2022-04-28 ENCOUNTER — Other Ambulatory Visit: Payer: Self-pay | Admitting: Cardiovascular Disease

## 2022-07-01 ENCOUNTER — Other Ambulatory Visit: Payer: Self-pay | Admitting: Cardiology

## 2022-07-01 ENCOUNTER — Other Ambulatory Visit: Payer: Self-pay | Admitting: Cardiovascular Disease

## 2022-07-06 ENCOUNTER — Other Ambulatory Visit: Payer: Self-pay | Admitting: Cardiovascular Disease

## 2022-07-18 ENCOUNTER — Other Ambulatory Visit: Payer: Self-pay | Admitting: Cardiovascular Disease

## 2022-08-28 ENCOUNTER — Ambulatory Visit: Payer: 59 | Attending: Cardiovascular Disease | Admitting: Cardiovascular Disease

## 2022-08-28 ENCOUNTER — Encounter: Payer: Self-pay | Admitting: Cardiovascular Disease

## 2022-08-28 VITALS — BP 106/66 | HR 66 | Ht 72.0 in | Wt 210.0 lb

## 2022-08-28 DIAGNOSIS — E119 Type 2 diabetes mellitus without complications: Secondary | ICD-10-CM

## 2022-08-28 DIAGNOSIS — I255 Ischemic cardiomyopathy: Secondary | ICD-10-CM

## 2022-08-28 DIAGNOSIS — I2510 Atherosclerotic heart disease of native coronary artery without angina pectoris: Secondary | ICD-10-CM | POA: Diagnosis not present

## 2022-08-28 DIAGNOSIS — E782 Mixed hyperlipidemia: Secondary | ICD-10-CM

## 2022-08-28 NOTE — Progress Notes (Signed)
Cardiology Office Note:    Date:  08/28/2022   ID:  Terie Purser, DOB 1971-03-26, MRN [id number]  PCP:  Kathyrn Lass   [facility] Cardiologist:  Sherren Mocha, MD     Referring MD: No ref. provider found   Chief Complaint  Patient presents with   Coronary Artery Disease    History of Present Illness:    James Francis is a 51 y.o. male presenting for follow-up of coronary artery disease.  He initially presented in 2022 with non-STEMI and was treated with PCI of an occluded LAD.  His LVEF at presentation was 35 to 40% but normalized on follow-up to 60 to 65%.  Background medical problems include hypertension, mixed hyperlipidemia, and type 2 diabetes.  The patient is here alone today.  He continues to be busy with his work at [employer].  He thinks he is working about 60 hours/week.  He does not have any chest pain, chest pressure, or shortness of breath.  He denies edema or heart palpitations.  He is compliant with his medications.  He does not follow his glycemic control at home.  He remains on metformin and Jardiance for diabetes.  He discontinued ticagrelor once he was out beyond a year from his MI.  Past Medical History:  Diagnosis Date   CHF (congestive heart failure) (HCC)    Colitis    Indeterminate cause   Coronary artery disease    Diabetes mellitus without complication (HCC)    Hearing loss    High frequency bilateral   History of broken nose    Multiple- has deviated septum   History of colon polyps    Hyperlipidemia    Hypertension    Ruptured lumbar disc    L4-L5 as a child    Past Surgical History:  Procedure Laterality Date   CARDIAC CATHETERIZATION     COLONOSCOPY     CORONARY STENT INTERVENTION N/A 12/14/2020   Procedure: CORONARY STENT INTERVENTION;  Surgeon: Jettie Booze, MD;  Location: [facility];  Service: Cardiovascular;  Laterality: N/A;   INTRAVASCULAR ULTRASOUND/IVUS N/A 12/14/2020   Procedure: Intravascular  Ultrasound/IVUS;  Surgeon: Jettie Booze, MD;  Location: [facility];  Service: Cardiovascular;  Laterality: N/A;   LEFT HEART CATH AND CORONARY ANGIOGRAPHY N/A 12/14/2020   Procedure: LEFT HEART CATH AND CORONARY ANGIOGRAPHY;  Surgeon: Jettie Booze, MD;  Location: [facility];  Service: Cardiovascular;  Laterality: N/A;   NO PAST SURGERIES      Current Medications: Current Meds  Medication Sig   aspirin 81 MG chewable tablet CHEW AND SWALLOW 1 TABLET BY  MOUTH DAILY   atorvastatin (LIPITOR) 80 MG tablet TAKE 1 TABLET BY MOUTH  DAILY   empagliflozin (JARDIANCE) 10 MG TABS tablet Take 1 tablet (10 mg total) by mouth daily before breakfast.   icosapent Ethyl (VASCEPA) 1 g capsule TAKE 2 CAPSULES BY MOUTH TWICE A DAY   metFORMIN (GLUCOPHAGE) 1000 MG tablet Take 0.5 tablets (500 mg total) by mouth 2 (two) times daily with a meal.   metoprolol succinate (TOPROL-XL) 25 MG 24 hr tablet TAKE 1 TABLET BY MOUTH  DAILY   nitroGLYCERIN (NITROSTAT) 0.4 MG SL tablet DISSOLVE 1 TABLET UNDER THE TONGUE EVERY 5 MINUTES AS  NEEDED FOR CHEST PAIN. MAX  OF 3 TABLETS IN 15 MINUTES. CALL 911 IF PAIN PERSISTS.   sacubitril-valsartan (ENTRESTO) 97-103 MG Take 1 tablet by mouth 2 (two) times daily.  Allergies:   Patient has no known allergies.   Social History   Socioeconomic History   Marital status: Married    Spouse name: Not on file   Number of children: 2   Years of education: 16   Highest education level: Bachelor's degree (e.g., BA, AB, BS)  Occupational History   Occupation: Art gallery manager  Tobacco Use   Smoking status: Never   Smokeless tobacco: Never  Vaping Use   Vaping Use: Never used  Substance and Sexual Activity   Alcohol use: Yes    Alcohol/week: 0.0 standard drinks of alcohol    Comment: occasionally    Drug use: No   Sexual activity: Yes  Other Topics Concern   Not on file  Social History Narrative   Not on file   Social Determinants of Health    Financial Resource Strain: Low Risk  (12/15/2020)   Overall Financial Resource Strain (CARDIA)    Difficulty of Paying Living Expenses: Not hard at all  Food Insecurity: No Food Insecurity (12/15/2020)   Hunger Vital Sign    Worried About Running Out of Food in the Last Year: Never true    Ran Out of Food in the Last Year: Never true  Transportation Needs: No Transportation Needs (12/15/2020)   PRAPARE - Administrator, Civil Service (Medical): No    Lack of Transportation (Non-Medical): No  Physical Activity: Not on file  Stress: Not on file  Social Connections: Not on file     Family History: The patient's family history includes Cancer in his maternal grandmother and mother; Crohn's disease in his paternal aunt; Diabetes in his paternal grandfather; Heart disease in his father and paternal grandfather; Kidney Stones in his mother. There is no history of Colon cancer, Stomach cancer, Rectal cancer, Esophageal cancer, or Liver cancer.  ROS:   Please see the history of present illness.    All other systems reviewed and are negative.  EKGs/Labs/Other Studies Reviewed:    The following studies were reviewed today: Cardiac catheterization 12/14/2020: There is moderate to severe left ventricular systolic dysfunction. LV end diastolic pressure is mildly elevated. The left ventricular ejection fraction is 25-35% by visual estimate. There is no aortic valve stenosis. Dist RCA lesion is 50% stenosed. 2nd Mrg lesion is 50% stenosed. Mid LAD lesion is 100% stenosed. A drug-eluting stent was successfully placed using a STENT RESOLUTE ONYX 3.5X30. Post intervention, there is a 0% residual stenosis.   Pain resolved after PCI.  Sx were intermittent over the prior three days.  Hopefully he will have recovery of LV function with revascularization.  Continue aggressive secondary prevention.  Medical therapy for LV dysfunction.   Echocardiogram 03/28/2021:  1. Left ventricular ejection  fraction, by estimation, is 60 to 65%. The  left ventricle has normal function. The left ventricle has no regional  wall motion abnormalities. There is mild concentric left ventricular  hypertrophy. Left ventricular diastolic  parameters are consistent with Grade I diastolic dysfunction (impaired  relaxation).   2. Right ventricular systolic function is normal. The right ventricular  size is normal.   3. The mitral valve is normal in structure. Trivial mitral valve  regurgitation. No evidence of mitral stenosis.   4. The aortic valve is tricuspid. Aortic valve regurgitation is mild. No  aortic stenosis is present.   5. Aortic dilatation noted. There is mild dilatation of the aortic root,  measuring 39 mm.   6. The inferior vena cava is normal in size with greater than  50%  respiratory variability, suggesting right atrial pressure of 3 mmHg.   Comparison(s): Compared to prior TTE on 12/14/20, the LVEF has improved to  60-65% and the anteroseptal WMA is no longer present.   EKG:  EKG is ordered today.  The ekg ordered today demonstrates normal sinus rhythm 66 bpm, within normal limits  Recent Labs: 10/04/2021: ALT 19; BUN 18; Creatinine, Ser 1.17; Potassium 4.4; Sodium 140  Recent Lipid Panel    Component Value Date/Time   CHOL 109 10/04/2021 0818   TRIG 95 10/04/2021 0818   HDL 42 10/04/2021 0818   CHOLHDL 2.6 10/04/2021 0818   CHOLHDL 4.1 12/14/2020 0341   VLDL 69 (H) 12/14/2020 0341   LDLCALC 49 10/04/2021 0818   LDLCALC 66 10/04/2020 0826     Risk Assessment/Calculations:                Physical Exam:    VS:  BP 106/66   Pulse 66   Ht 6' (1.829 m)   Wt 210 lb (95.3 kg)   SpO2 96%   BMI 28.48 kg/m     Wt Readings from Last 3 Encounters:  08/28/22 210 lb (95.3 kg)  10/10/21 212 lb (96.2 kg)  06/14/21 201 lb 14.4 oz (91.6 kg)     GEN:  Well nourished, well developed in no acute distress HEENT: Normal NECK: No JVD; No carotid bruits LYMPHATICS: No  lymphadenopathy CARDIAC: RRR, no murmurs, rubs, gallops RESPIRATORY:  Clear to auscultation without rales, wheezing or rhonchi  ABDOMEN: Soft, non-tender, non-distended MUSCULOSKELETAL:  No edema; No deformity  SKIN: Warm and dry NEUROLOGIC:  Alert and oriented x 3 PSYCHIATRIC:  Normal affect   ASSESSMENT:    1. Coronary artery disease involving native coronary artery of native heart without angina pectoris   2. Diabetes mellitus without complication (HCC)   3. Mixed hyperlipidemia   4. Ischemic cardiomyopathy    PLAN:    In order of problems listed above:  The patient is stable without symptoms of angina.  However, he did have moderate nonobstructive disease in the right coronary artery at the time of catheterization.  He has diabetes and is at risk for silent ischemia.  I have recommended an exercise stress Myoview scan for surveillance of his ischemic heart disease.  He will continue his medical regimen which includes aspirin for antiplatelet therapy, high intensity statin drug, Jardiance, and a beta-blocker with metoprolol succinate. Check hemoglobin A1c.  Continue Jardiance and metformin.  Should establish with a PCP. Treated with atorvastatin.  Last lipids showed a cholesterol 109, LDL 49, HDL 42.  Update labs when he returns for his stress test. LV function has recovered on a combination of Jardiance, Entresto, and metoprolol succinate.  Spironolactone was discontinued last year when he had symptoms of fatigue and low blood pressure.      Shared Decision Making/Informed Consent The risks [chest pain, shortness of breath, cardiac arrhythmias, dizziness, blood pressure fluctuations, myocardial infarction, stroke/transient ischemic attack, nausea, vomiting, allergic reaction, radiation exposure, metallic taste sensation and life-threatening complications (estimated to be 1 in 10,000)], benefits (risk stratification, diagnosing coronary artery disease, treatment guidance) and  alternatives of a nuclear stress test were discussed in detail with James Francis and he agrees to proceed.    Medication Adjustments/Labs and Tests Ordered: Current medicines are reviewed at length with the patient today.  Concerns regarding medicines are outlined above.  Orders Placed This Encounter  Procedures   CBC   Comprehensive metabolic panel   Lipid panel  Hemoglobin A1c   MYOCARDIAL PERFUSION IMAGING   EKG 12-Lead   No orders of the defined types were placed in this encounter.   Patient Instructions  Medication Instructions:  Your physician recommends that you continue on your current medications as directed. Please refer to the Current Medication list given to you today.  *If you need a refill on your cardiac medications before your next appointment, please call your pharmacy*   Lab Work: CBC, CMET, LIPIDS, A1C (same day as nuclear test) If you have labs (blood work) drawn today and your tests are completely normal, you will receive your results only by: MyChart Message (if you have MyChart) OR A paper copy in the mail If you have any lab test that is abnormal or we need to change your treatment, we will call you to review the results.   Testing/Procedures: Exercise Stress Myoview Your physician has requested that you have en exercise stress myoview. For further information please visit [email or web address] Please follow instruction sheet, as given.  Follow-Up: At [facility], you and your health needs are our priority.  As part of our continuing mission to provide you with exceptional heart care, we have created designated Provider Care Teams.  These Care Teams include your primary Cardiologist (physician) and Advanced Practice Providers (APPs -  Physician Assistants and Nurse Practitioners) who all work together to provide you with the care you need, when you need it.  Your next appointment:   1 year(s)  The format for your next appointment:   In  Person  Provider:   Tonny Bollman, MD  or APP    Other Instructions See Separate instruction sheet for stress test information  Important Information About Sugar         Signed, Tonny Bollman, MD  08/28/2022 5:29 PM    [facility]

## 2022-08-28 NOTE — Patient Instructions (Signed)
Medication Instructions:  Your physician recommends that you continue on your current medications as directed. Please refer to the Current Medication list given to you today.  *If you need a refill on your cardiac medications before your next appointment, please call your pharmacy*   Lab Work: CBC, CMET, LIPIDS, A1C (same day as nuclear test) If you have labs (blood work) drawn today and your tests are completely normal, you will receive your results only by: [place] (if you have MyChart) OR A paper copy in the mail If you have any lab test that is abnormal or we need to change your treatment, we will call you to review the results.   Testing/Procedures: Exercise Stress Myoview Your physician has requested that you have en exercise stress myoview. For further information please visit [email or web address]. Please follow instruction sheet, as given.  Follow-Up: At [facility], you and your health needs are our priority.  As part of our continuing mission to provide you with exceptional heart care, we have created designated Provider Care Teams.  These Care Teams include your primary Cardiologist (physician) and Advanced Practice Providers (APPs -  Physician Assistants and Nurse Practitioners) who all work together to provide you with the care you need, when you need it.  Your next appointment:   1 year(s)  The format for your next appointment:   In Person  Provider:   Sherren Mocha, MD  or APP    Other Instructions See Separate instruction sheet for stress test information  Important Information About Sugar

## 2022-08-29 ENCOUNTER — Telehealth (HOSPITAL_COMMUNITY): Payer: Self-pay | Admitting: *Deleted

## 2022-08-29 NOTE — Telephone Encounter (Signed)
Patient given detailed instructions per Myocardial Perfusion Study Information Sheet for the test on 09/04/22 at 7:30. Patient notified to arrive 15 minutes early and that it is imperative to arrive on time for appointment to keep from having the test rescheduled.  If you need to cancel or reschedule your appointment, please call the office within 24 hours of your appointment. . Patient verbalized understanding.James Francis

## 2022-09-04 ENCOUNTER — Ambulatory Visit (HOSPITAL_COMMUNITY): Payer: 59 | Attending: Cardiovascular Disease

## 2022-09-04 ENCOUNTER — Ambulatory Visit: Payer: 59

## 2022-09-04 DIAGNOSIS — I2510 Atherosclerotic heart disease of native coronary artery without angina pectoris: Secondary | ICD-10-CM

## 2022-09-04 DIAGNOSIS — E782 Mixed hyperlipidemia: Secondary | ICD-10-CM | POA: Insufficient documentation

## 2022-09-04 DIAGNOSIS — E119 Type 2 diabetes mellitus without complications: Secondary | ICD-10-CM | POA: Insufficient documentation

## 2022-09-04 DIAGNOSIS — I255 Ischemic cardiomyopathy: Secondary | ICD-10-CM | POA: Insufficient documentation

## 2022-09-04 LAB — MYOCARDIAL PERFUSION IMAGING
Angina Index: 0
Duke Treadmill Score: 11
Estimated workload: 13.4
Exercise duration (min): 11 min
Exercise duration (sec): 0 s
LV dias vol: 96 mL (ref 62–150)
LV sys vol: 44 mL
MPHR: 169 {beats}/min
Nuc Stress EF: 54 %
Peak HR: 169 {beats}/min
Percent HR: 100 %
Rest HR: 60 {beats}/min
Rest Nuclear Isotope Dose: 10.7 mCi
SDS: 1
SRS: 2
SSS: 3
ST Depression (mm): 0 mm
Stress Nuclear Isotope Dose: 32 mCi
TID: 0.78

## 2022-09-04 MED ORDER — TECHNETIUM TC 99M TETROFOSMIN IV KIT
32.0000 | PACK | Freq: Once | INTRAVENOUS | Status: AC | PRN
  Administered 2022-09-04: 32 via INTRAVENOUS

## 2022-09-04 MED ORDER — TECHNETIUM TC 99M TETROFOSMIN IV KIT
10.7000 | PACK | Freq: Once | INTRAVENOUS | Status: AC | PRN
  Administered 2022-09-04: 10.7 via INTRAVENOUS

## 2022-09-05 LAB — HEMOGLOBIN A1C
Est. average glucose Bld gHb Est-mCnc: 146 mg/dL
Hgb A1c MFr Bld: 6.7 % — ABNORMAL HIGH (ref 4.8–5.6)

## 2022-09-05 LAB — COMPREHENSIVE METABOLIC PANEL
ALT: 20 IU/L (ref 0–44)
AST: 19 IU/L (ref 0–40)
Albumin/Globulin Ratio: 3.2 — ABNORMAL HIGH (ref 1.2–2.2)
Albumin: 4.5 g/dL (ref 3.8–4.9)
Alkaline Phosphatase: 47 IU/L (ref 44–121)
BUN/Creatinine Ratio: 17 (ref 9–20)
BUN: 16 mg/dL (ref 6–24)
Bilirubin Total: 0.7 mg/dL (ref 0.0–1.2)
CO2: 23 mmol/L (ref 20–29)
Calcium: 8.8 mg/dL (ref 8.7–10.2)
Chloride: 107 mmol/L — ABNORMAL HIGH (ref 96–106)
Creatinine, Ser: 0.96 mg/dL (ref 0.76–1.27)
Globulin, Total: 1.4 g/dL — ABNORMAL LOW (ref 1.5–4.5)
Glucose: 123 mg/dL — ABNORMAL HIGH (ref 70–99)
Potassium: 4.1 mmol/L (ref 3.5–5.2)
Sodium: 141 mmol/L (ref 134–144)
Total Protein: 5.9 g/dL — ABNORMAL LOW (ref 6.0–8.5)
eGFR: 96 mL/min/{1.73_m2} (ref >59)

## 2022-09-05 LAB — LIPID PANEL
Chol/HDL Ratio: 2 ratio (ref 0.0–5.0)
Cholesterol, Total: 95 mg/dL — ABNORMAL LOW (ref 100–199)
HDL: 47 mg/dL (ref >39)
LDL Chol Calc (NIH): 33 mg/dL (ref 0–99)
Triglycerides: 66 mg/dL (ref 0–149)
VLDL Cholesterol Cal: 15 mg/dL (ref 5–40)

## 2022-09-05 LAB — CBC
Hematocrit: 43.6 % (ref 37.5–51.0)
Hemoglobin: 14.2 g/dL (ref 13.0–17.7)
MCH: 27.8 pg (ref 26.6–33.0)
MCHC: 32.6 g/dL (ref 31.5–35.7)
MCV: 85 fL (ref 79–97)
Platelets: 215 10*3/uL (ref 150–450)
RBC: 5.11 x10E6/uL (ref 4.14–5.80)
RDW: 12.7 % (ref 11.6–15.4)
WBC: 5.5 10*3/uL (ref 3.4–10.8)

## 2022-09-30 ENCOUNTER — Other Ambulatory Visit: Payer: Self-pay | Admitting: Cardiovascular Disease

## 2022-10-17 ENCOUNTER — Other Ambulatory Visit: Payer: Self-pay | Admitting: Cardiovascular Disease

## 2022-10-25 ENCOUNTER — Encounter: Payer: Self-pay | Admitting: Cardiovascular Disease

## 2022-10-26 MED ORDER — ENTRESTO 97-103 MG PO TABS: 1.0000 | ORAL_TABLET | Freq: Two times a day (BID) | ORAL | 3 refills | Status: DC

## 2022-12-11 ENCOUNTER — Other Ambulatory Visit (HOSPITAL_BASED_OUTPATIENT_CLINIC_OR_DEPARTMENT_OTHER): Payer: Self-pay | Admitting: General Practice

## 2023-01-13 ENCOUNTER — Telehealth: Payer: 59 | Admitting: Family Medicine

## 2023-01-13 DIAGNOSIS — J069 Acute upper respiratory infection, unspecified: Secondary | ICD-10-CM

## 2023-01-13 MED ORDER — AZITHROMYCIN 250 MG PO TABS: ORAL_TABLET | ORAL | 0 refills | Status: AC

## 2023-01-13 MED ORDER — PREDNISONE 20 MG PO TABS: 20.0000 mg | ORAL_TABLET | Freq: Two times a day (BID) | ORAL | 0 refills | Status: AC

## 2023-01-13 NOTE — Patient Instructions (Signed)

## 2023-01-13 NOTE — Progress Notes (Signed)
Virtual Visit Consent   James Francis, you are scheduled for a virtual visit with a [facility] provider today. Just as with appointments in the office, your consent must be obtained to participate. Your consent will be active for this visit and any virtual visit you may have with one of our providers in the next 365 days. If you have a MyChart account, a copy of this consent can be sent to you electronically.  As this is a virtual visit, video technology does not allow for your provider to perform a traditional examination. This may limit your provider's ability to fully assess your condition. If your provider identifies any concerns that need to be evaluated in person or the need to arrange testing (such as labs, EKG, etc.), we will make arrangements to do so. Although advances in technology are sophisticated, we cannot ensure that it will always work on either your end or our end. If the connection with a video visit is poor, the visit may have to be switched to a telephone visit. With either a video or telephone visit, we are not always able to ensure that we have a secure connection.  By engaging in this virtual visit, you consent to the provision of healthcare and authorize for your insurance to be billed (if applicable) for the services provided during this visit. Depending on your insurance coverage, you may receive a charge related to this service.  I need to obtain your verbal consent now. Are you willing to proceed with your visit today? James Francis has provided verbal consent on 01/13/2023 for a virtual visit (video or telephone). James Nims, FNP  Date: 01/13/2023 9:34 AM  Virtual Visit via Video Note   I, James Francis, connected with  James Francis  ([id number], 01-01-1971) on 01/13/23 at  9:30 AM EST by a video-enabled telemedicine application and verified that I am speaking with the correct person using two identifiers.  Location: Patient: Virtual Visit Location Patient: Home Provider:  Virtual Visit Location Provider: Home Office   I discussed the limitations of evaluation and management by telemedicine and the availability of in person appointments. The patient expressed understanding and agreed to proceed.    History of Present Illness: James Francis is a 52 y.o. who identifies as a male who was assigned male at birth, and is being seen today for cough, wheezing, no fever, sinus drainage and pressure, in no distress. Sx for over a week persistent. No covid tests done. Marland Kitchen  HPI: HPI  Problems:  Patient Active Problem List   Diagnosis Date Noted   Ischemic cardiomyopathy 12/15/2020   NSTEMI (non-ST elevated myocardial infarction) ([place]) 12/14/2020   Bulging lumbar disc 08/12/2014   Left knee pain 02/02/2014   Diabetes mellitus without complication ([place]) [id number]   Hyperlipidemia 08/04/2013   Obesity 08/04/2013    Allergies: No Known Allergies Medications:  Current Outpatient Medications:    aspirin 81 MG chewable tablet, CHEW AND SWALLOW 1 TABLET BY  MOUTH DAILY, Disp: 90 tablet, Rfl: 1   atorvastatin (LIPITOR) 80 MG tablet, TAKE 1 TABLET BY MOUTH ONCE  DAILY, Disp: 90 tablet, Rfl: 3   BRILINTA 90 MG TABS tablet, TAKE 1 TABLET BY MOUTH  TWICE DAILY (Patient not taking: Reported on 08/28/2022), Disp: 180 tablet, Rfl: 1   Glucose Blood (BLOOD GLUCOSE TEST STRIPS) STRP, Please dispense based on patient and insurance preference. Use as directed to monitor FSBS 1x daily. Dx: E11.9. (Patient not taking: Reported on 08/28/2022), Disp: 100 each, Rfl: 1  icosapent Ethyl (VASCEPA) 1 g capsule, TAKE 2 CAPSULES BY MOUTH TWICE A DAY, Disp: 360 capsule, Rfl: 3   metFORMIN (GLUCOPHAGE) 1000 MG tablet, Take 0.5 tablets (500 mg total) by mouth 2 (two) times daily with a meal., Disp: 90 tablet, Rfl: 3   metoprolol succinate (TOPROL-XL) 25 MG 24 hr tablet, TAKE 1 TABLET (25 MG TOTAL) BY MOUTH DAILY., Disp: 90 tablet, Rfl: 3   nitroGLYCERIN (NITROSTAT) 0.4 MG SL tablet, DISSOLVE 1 TABLET  UNDER THE  TONGUE EVERY 5 MINUTES AS NEEDED FOR CHEST PAIN. MAX OF 3 TABLETS IN 15 MINUTES. CALL 911 IF PAIN  PERSISTS., Disp: 25 tablet, Rfl: 1   sacubitril-valsartan (ENTRESTO) 97-103 MG, Take 1 tablet by mouth 2 (two) times daily., Disp: 180 tablet, Rfl: 3  Observations/Objective: Patient is well-developed, well-nourished in no acute distress.  Resting comfortably  at home.  Head is normocephalic, atraumatic.  No labored breathing.  Speech is clear and coherent with logical content.  Patient is alert and oriented at baseline.    Assessment and Plan: 1. Upper respiratory tract infection, unspecified type  Increase fluids, humidifier at night, tylenol as directed, continue nyquil at night, urgent care if sx worsen.   Follow Up Instructions: I discussed the assessment and treatment plan with the patient. The patient was provided an opportunity to ask questions and all were answered. The patient agreed with the plan and demonstrated an understanding of the instructions.  A copy of instructions were sent to the patient via MyChart unless otherwise noted below.     The patient was advised to call back or seek an in-person evaluation if the symptoms worsen or if the condition fails to improve as anticipated.  Time:  I spent 10 minutes with the patient via telehealth technology discussing the above problems/concerns.    James Nims, FNP

## 2023-02-02 ENCOUNTER — Other Ambulatory Visit: Payer: Self-pay | Admitting: Cardiovascular Disease

## 2023-02-06 ENCOUNTER — Other Ambulatory Visit: Payer: Self-pay | Admitting: Cardiovascular Disease

## 2023-02-14 ENCOUNTER — Other Ambulatory Visit: Payer: Self-pay | Admitting: Cardiovascular Disease

## 2023-03-08 ENCOUNTER — Telehealth: Payer: 59 | Admitting: Physician Assistant

## 2023-03-08 DIAGNOSIS — H00014 Hordeolum externum left upper eyelid: Secondary | ICD-10-CM

## 2023-03-08 MED ORDER — POLYMYXIN B-TRIMETHOPRIM 10000-0.1 UNIT/ML-% OP SOLN: OPHTHALMIC | 0 refills | Status: DC

## 2023-03-08 NOTE — Progress Notes (Signed)
Virtual Visit Consent   James Francis, you are scheduled for a virtual visit with a [facility] provider today. Just as with appointments in the office, your consent must be obtained to participate. Your consent will be active for this visit and any virtual visit you may have with one of our providers in the next 365 days. If you have a MyChart account, a copy of this consent can be sent to you electronically.  As this is a virtual visit, video technology does not allow for your provider to perform a traditional examination. This may limit your provider's ability to fully assess your condition. If your provider identifies any concerns that need to be evaluated in person or the need to arrange testing (such as labs, EKG, etc.), we will make arrangements to do so. Although advances in technology are sophisticated, we cannot ensure that it will always work on either your end or our end. If the connection with a video visit is poor, the visit may have to be switched to a telephone visit. With either a video or telephone visit, we are not always able to ensure that we have a secure connection.  By engaging in this virtual visit, you consent to the provision of healthcare and authorize for your insurance to be billed (if applicable) for the services provided during this visit. Depending on your insurance coverage, you may receive a charge related to this service.  I need to obtain your verbal consent now. Are you willing to proceed with your visit today? James Francis has provided verbal consent on 03/08/2023 for a virtual visit (video or telephone). James Francis, [place]  Date: 03/08/2023 3:37 PM  Virtual Visit via Video Note   I, James Francis, connected with  James Francis  ([id number], 05-31-1971) on 03/08/23 at  3:15 PM EDT by a video-enabled telemedicine application and verified that I am speaking with the correct person using two identifiers.  Location: Patient: Virtual Visit Location Patient:  Home Provider: Virtual Visit Location Provider: Home Office   I discussed the limitations of evaluation and management by telemedicine and the availability of in person appointments. The patient expressed understanding and agreed to proceed.    History of Present Illness: James Francis is a 52 y.o. who identifies as a male who was assigned male at birth, and is being seen today for stye of left upper eyelid since Tuesday. Notes area is swollen and tender. Noting crusting on eyelid in the morning. Denies fevers, chills, malaise or fatigue.    HPI: HPI  Problems:  Patient Active Problem List   Diagnosis Date Noted   Ischemic cardiomyopathy 12/15/2020   NSTEMI (non-ST elevated myocardial infarction) 12/14/2020   Bulging lumbar disc 08/12/2014   Left knee pain 02/02/2014   Diabetes mellitus without complication 10/13/2013   Hyperlipidemia 08/04/2013   Obesity 08/04/2013    Allergies: No Known Allergies Medications:  Current Outpatient Medications:    aspirin 81 MG chewable tablet, CHEW AND SWALLOW 1 TABLET BY  MOUTH DAILY, Disp: 120 tablet, Rfl: 0   atorvastatin (LIPITOR) 80 MG tablet, TAKE 1 TABLET BY MOUTH ONCE  DAILY, Disp: 90 tablet, Rfl: 3   empagliflozin (JARDIANCE) 10 MG TABS tablet, Take 1 tablet (10 mg total) by mouth daily before breakfast., Disp: 30 tablet, Rfl: 6   icosapent Ethyl (VASCEPA) 1 g capsule, TAKE 2 CAPSULES BY MOUTH TWICE A DAY, Disp: 360 capsule, Rfl: 3   metFORMIN (GLUCOPHAGE) 1000 MG tablet, Take 0.5 tablets (500 mg total) by  mouth 2 (two) times daily with a meal., Disp: 90 tablet, Rfl: 3   metoprolol succinate (TOPROL-XL) 25 MG 24 hr tablet, TAKE 1 TABLET (25 MG TOTAL) BY MOUTH DAILY., Disp: 90 tablet, Rfl: 3   nitroGLYCERIN (NITROSTAT) 0.4 MG SL tablet, DISSOLVE 1 TABLET UNDER THE  TONGUE EVERY 5 MINUTES AS NEEDED FOR CHEST PAIN. MAX OF 3 TABLETS IN 15 MINUTES. CALL 911 IF PAIN  PERSISTS., Disp: 25 tablet, Rfl: 6   sacubitril-valsartan (ENTRESTO) 97-103 MG, Take  1 tablet by mouth 2 (two) times daily., Disp: 180 tablet, Rfl: 3  Observations/Objective: Patient is well-developed, well-nourished in no acute distress.  Resting comfortably at home.  Head is normocephalic, atraumatic.  No labored breathing.  Speech is clear and coherent with logical content.  Patient is alert and oriented at baseline.  Left upper eyelid with stye noted. No active drainage noted. No noted conjunctival injection bilaterally. Pupil are equal and round.   Assessment and Plan: 1. Hordeolum externum of left upper eyelid  Discussed supportive measures including hand hygiene, avoidance of rubbing the area and initiation of warm compresses. If tenderness increasing or any increased drainage, will also have him start polytrim drops as directed. In person follow-up discussed.   Follow Up Instructions: I discussed the assessment and treatment plan with the patient. The patient was provided an opportunity to ask questions and all were answered. The patient agreed with the plan and demonstrated an understanding of the instructions.  A copy of instructions were sent to the patient via MyChart unless otherwise noted below.   The patient was advised to call back or seek an in-person evaluation if the symptoms worsen or if the condition fails to improve as anticipated.  Time:  I spent 10 minutes with the patient via telehealth technology discussing the above problems/concerns.    James Climes, PA-C

## 2023-03-08 NOTE — Patient Instructions (Signed)
Wylie Hail, thank you for joining Piedad Climes, PA-C for today's virtual visit.  While this provider is not your primary care provider (PCP), if your PCP is located in our provider database this encounter information will be shared with them immediately following your visit.   A [facility] MyChart account gives you access to today's visit and all your visits, tests, and labs performed at [facility] " click here if you don't have a [facility] MyChart account or go to [email or web address]  Consent: (Patient) Silvia Markuson provided verbal consent for this virtual visit at the beginning of the encounter.  Current Medications:  Current Outpatient Medications:    aspirin 81 MG chewable tablet, CHEW AND SWALLOW 1 TABLET BY  MOUTH DAILY, Disp: 120 tablet, Rfl: 0   atorvastatin (LIPITOR) 80 MG tablet, TAKE 1 TABLET BY MOUTH ONCE  DAILY, Disp: 90 tablet, Rfl: 3   BRILINTA 90 MG TABS tablet, TAKE 1 TABLET BY MOUTH  TWICE DAILY (Patient not taking: Reported on 08/28/2022), Disp: 180 tablet, Rfl: 1   empagliflozin (JARDIANCE) 10 MG TABS tablet, Take 1 tablet (10 mg total) by mouth daily before breakfast., Disp: 30 tablet, Rfl: 6   Glucose Blood (BLOOD GLUCOSE TEST STRIPS) STRP, Please dispense based on patient and insurance preference. Use as directed to monitor FSBS 1x daily. Dx: E11.9. (Patient not taking: Reported on 08/28/2022), Disp: 100 each, Rfl: 1   icosapent Ethyl (VASCEPA) 1 g capsule, TAKE 2 CAPSULES BY MOUTH TWICE A DAY, Disp: 360 capsule, Rfl: 3   metFORMIN (GLUCOPHAGE) 1000 MG tablet, Take 0.5 tablets (500 mg total) by mouth 2 (two) times daily with a meal., Disp: 90 tablet, Rfl: 3   metoprolol succinate (TOPROL-XL) 25 MG 24 hr tablet, TAKE 1 TABLET (25 MG TOTAL) BY MOUTH DAILY., Disp: 90 tablet, Rfl: 3   nitroGLYCERIN (NITROSTAT) 0.4 MG SL tablet, DISSOLVE 1 TABLET UNDER THE  TONGUE EVERY 5 MINUTES AS NEEDED FOR CHEST PAIN. MAX OF 3 TABLETS IN 15 MINUTES. CALL 911 IF  PAIN  PERSISTS., Disp: 25 tablet, Rfl: 6   sacubitril-valsartan (ENTRESTO) 97-103 MG, Take 1 tablet by mouth 2 (two) times daily., Disp: 180 tablet, Rfl: 3   Medications ordered in this encounter:  No orders of the defined types were placed in this encounter.    *If you need refills on other medications prior to your next appointment, please contact your pharmacy*  Follow-Up: Call back or seek an in-person evaluation if the symptoms worsen or if the condition fails to improve as anticipated.  [facility] [phone]  Other Instructions Keep hands washed and avoid touching the area with bare hands. Start warm compresses as discussed. If tenderness not resolving, or any worsening symptoms, start the polytrim drops as directed. Follow-up with your eye doctor if stye is not resolving within next 10-14 days.    If you have been instructed to have an in-person evaluation today at a local Urgent Care facility, please use the link below. It will take you to a list of all of our available [facility], including address, phone number and hours of operation. Please do not delay care.  [facility]  If you or a family member do not have a primary care provider, use the link below to schedule a visit and establish care. When you choose a [facility] primary care physician or advanced practice provider, you gain a long-term partner in health. Find a Primary Care Provider  Learn  more about [facility]'s in-office and virtual care options: [facility]

## 2023-06-06 ENCOUNTER — Other Ambulatory Visit: Payer: Self-pay | Admitting: Cardiovascular Disease

## 2023-06-12 ENCOUNTER — Other Ambulatory Visit: Payer: Self-pay | Admitting: Cardiovascular Disease

## 2023-07-15 ENCOUNTER — Other Ambulatory Visit: Payer: Self-pay | Admitting: Cardiovascular Disease

## 2023-08-16 ENCOUNTER — Other Ambulatory Visit: Payer: Self-pay | Admitting: Cardiovascular Disease

## 2023-09-09 ENCOUNTER — Other Ambulatory Visit: Payer: Self-pay | Admitting: Cardiovascular Disease

## 2023-09-12 ENCOUNTER — Other Ambulatory Visit: Payer: Self-pay | Admitting: Cardiovascular Disease

## 2023-09-25 ENCOUNTER — Other Ambulatory Visit: Payer: Self-pay | Admitting: Cardiovascular Disease

## 2023-09-30 ENCOUNTER — Other Ambulatory Visit: Payer: Self-pay | Admitting: Cardiovascular Disease

## 2023-10-03 MED ORDER — ASPIRIN 81 MG PO CHEW: 81.0000 mg | CHEWABLE_TABLET | Freq: Every day | ORAL | 0 refills | Status: DC

## 2023-10-03 NOTE — Addendum Note (Signed)
Addended by: Daphane Shepherd on: 10/03/2023 09:19 AM   Modules accepted: Orders

## 2023-10-04 ENCOUNTER — Other Ambulatory Visit: Payer: Self-pay | Admitting: Cardiovascular Disease

## 2023-10-15 ENCOUNTER — Other Ambulatory Visit: Payer: Self-pay | Admitting: Cardiovascular Disease

## 2023-11-03 ENCOUNTER — Telehealth: Payer: 59 | Admitting: Physician Assistant

## 2023-11-03 DIAGNOSIS — J069 Acute upper respiratory infection, unspecified: Secondary | ICD-10-CM | POA: Diagnosis not present

## 2023-11-03 DIAGNOSIS — R0602 Shortness of breath: Secondary | ICD-10-CM

## 2023-11-03 MED ORDER — CETIRIZINE HCL 10 MG PO TABS: 10.0000 mg | ORAL_TABLET | Freq: Every day | ORAL | 0 refills | Status: DC

## 2023-11-03 MED ORDER — FLUTICASONE PROPIONATE 50 MCG/ACT NA SUSP: 2.0000 | Freq: Every day | NASAL | 6 refills | Status: DC

## 2023-11-03 MED ORDER — BENZONATATE 100 MG PO CAPS: 100.0000 mg | ORAL_CAPSULE | Freq: Two times a day (BID) | ORAL | 0 refills | Status: DC | PRN

## 2023-11-03 NOTE — Progress Notes (Signed)
Because of your symptoms and medical history, I feel your condition warrants further evaluation and I recommend that you be seen in a face to face visit.   NOTE: There will be NO CHARGE for this eVisit   If you are having a true medical emergency please call 911.      For an urgent face to face visit, [facility] has eight urgent care centers for your convenience:   NEW!! [facility] Health Urgent Care Center at [facility] Get Driving Directions [phone] [address] [place], [address]    [facility] Health Urgent Care Center at [facility] Get Driving Directions [phone] [address] [place], [place] [address]   [facility] Health Urgent Care Center [facility]) Get Driving Directions [phone] [address] [place], [place] [address]  [facility] Health Urgent Care Center [facility] [place]) Get Driving Directions [phone] [address] [place],  [place]  [address]  [facility] Health Urgent Care Center [facility] - at [employer]  [phone] [address] [place],  [place] [address]   [facility] Health Urgent Care at [facility] Get Driving Directions [phone] [address] [place], [place] [address]   [facility] Health Urgent Care at [facility] Get Driving Directions  [phone] [address] [place], [place] [address]   [facility] Health Urgent Care at [facility] Directions [phone] [address] [place], [place] [address]  Your MyChart E-visit questionnaire answers were reviewed by a board certified advanced clinical practitioner to complete your personal care plan based on your specific symptoms.  Thank you for using e-Visits.

## 2023-11-03 NOTE — Patient Instructions (Signed)
  James Francis, thank you for joining Laure Kidney, PA-C for today's virtual visit.  While this provider is not your primary care provider (PCP), if your PCP is located in our provider database this encounter information will be shared with them immediately following your visit.   A [facility] MyChart account gives you access to today's visit and all your visits, tests, and labs performed at [facility] " click here if you don't have a [facility] MyChart account or go to [email or web address]  Consent: (Patient) James Francis provided verbal consent for this virtual visit at the beginning of the encounter.  Current Medications:  Current Outpatient Medications:    aspirin 81 MG chewable tablet, Chew 1 tablet (81 mg total) by mouth daily., Disp: 120 tablet, Rfl: 0   atorvastatin (LIPITOR) 80 MG tablet, TAKE 1 TABLET BY MOUTH ONCE  DAILY, Disp: 90 tablet, Rfl: 3   empagliflozin (JARDIANCE) 10 MG TABS tablet, TAKE 1 TABLET BY MOUTH EVERY DAY BEFORE BREAKFAST, Disp: 15 tablet, Rfl: 0   ENTRESTO 97-103 MG, TAKE 1 TABLET BY MOUTH TWICE A DAY, Disp: 180 tablet, Rfl: 3   icosapent Ethyl (VASCEPA) 1 g capsule, TAKE 2 CAPSULES BY MOUTH TWICE A DAY, Disp: 360 capsule, Rfl: 0   metFORMIN (GLUCOPHAGE) 1000 MG tablet, Take 0.5 tablets (500 mg total) by mouth 2 (two) times daily with a meal., Disp: 90 tablet, Rfl: 3   metoprolol succinate (TOPROL-XL) 25 MG 24 hr tablet, TAKE 1 TABLET (25 MG TOTAL) BY MOUTH DAILY., Disp: 90 tablet, Rfl: 3   nitroGLYCERIN (NITROSTAT) 0.4 MG SL tablet, DISSOLVE 1 TABLET UNDER THE  TONGUE EVERY 5 MINUTES AS NEEDED FOR CHEST PAIN. MAX OF 3 TABLETS IN 15 MINUTES. CALL 911 IF PAIN  PERSISTS., Disp: 25 tablet, Rfl: 2   trimethoprim-polymyxin b (POLYTRIM) ophthalmic solution, Apply 1-2 drops into affected eye QID x 5 days., Disp: 10 mL, Rfl: 0   Medications ordered in this encounter:  No orders of the defined types were placed in this encounter.    *If you need  refills on other medications prior to your next appointment, please contact your pharmacy*  Follow-Up: Call back or seek an in-person evaluation if the symptoms worsen or if the condition fails to improve as anticipated.  [facility] [phone]  Other Instructions Follow up with primary care or ER in 2 -3 days if you have worsening symptoms.    If you have been instructed to have an in-person evaluation today at a local Urgent Care facility, please use the link below. It will take you to a list of all of our available [facility], including address, phone number and hours of operation. Please do not delay care.  [facility]  If you or a family member do not have a primary care provider, use the link below to schedule a visit and establish care. When you choose a [facility] primary care physician or advanced practice provider, you gain a long-term partner in health. Find a Primary Care Provider  Learn more about [facility]'s in-office and virtual care options:  - Get Care Now

## 2023-11-03 NOTE — Progress Notes (Signed)
Virtual Visit Consent   James Francis, you are scheduled for a virtual visit with a  provider today. Just as with appointments in the office, your consent must be obtained to participate. Your consent will be active for this visit and any virtual visit you may have with one of our providers in the next 365 days. If you have a MyChart account, a copy of this consent can be sent to you electronically.  As this is a virtual visit, video technology does not allow for your provider to perform a traditional examination. This may limit your provider's ability to fully assess your condition. If your provider identifies any concerns that need to be evaluated in person or the need to arrange testing (such as labs, EKG, etc.), we will make arrangements to do so. Although advances in technology are sophisticated, we cannot ensure that it will always work on either your end or our end. If the connection with a video visit is poor, the visit may have to be switched to a telephone visit. With either a video or telephone visit, we are not always able to ensure that we have a secure connection.  By engaging in this virtual visit, you consent to the provision of healthcare and authorize for your insurance to be billed (if applicable) for the services provided during this visit. Depending on your insurance coverage, you may receive a charge related to this service.  I need to obtain your verbal consent now. Are you willing to proceed with your visit today? James Francis has provided verbal consent on 11/03/2023 for a virtual visit (video or telephone). Laure Kidney, [place]  Date: 11/03/2023 11:08 AM  Virtual Visit via Video Note   I, Laure Kidney, connected with  James Francis  ([id number], [birth year]) on 11/03/23 at 11:15 AM EST by a video-enabled telemedicine application and verified that I am speaking with the correct person using two identifiers.  Location: Patient: Virtual Visit Location Patient:  Mobile Provider: Virtual Visit Location Provider: Home Office   I discussed the limitations of evaluation and management by telemedicine and the availability of in person appointments. The patient expressed understanding and agreed to proceed.    History of Present Illness: James Francis is a 52 y.o. who identifies as a male who was assigned male at birth, and is being seen today for URI.  HPI: URI  This is a new problem. The current episode started in the past 7 days. The problem has been unchanged. There has been no fever. Associated symptoms include coughing and rhinorrhea. Treatments tried: day/nyquil. The treatment provided mild relief.    Problems:  Patient Active Problem List   Diagnosis Date Noted   Ischemic cardiomyopathy 12/15/2020   NSTEMI (non-ST elevated myocardial infarction) (HCC) 12/14/2020   Bulging lumbar disc 08/12/2014   Left knee pain 02/02/2014   Diabetes mellitus without complication (HCC) 10/13/2013   Hyperlipidemia 08/04/2013   Obesity 08/04/2013    Allergies: No Known Allergies Medications:  Current Outpatient Medications:    aspirin 81 MG chewable tablet, Chew 1 tablet (81 mg total) by mouth daily., Disp: 120 tablet, Rfl: 0   atorvastatin (LIPITOR) 80 MG tablet, TAKE 1 TABLET BY MOUTH ONCE  DAILY, Disp: 90 tablet, Rfl: 3   empagliflozin (JARDIANCE) 10 MG TABS tablet, TAKE 1 TABLET BY MOUTH EVERY DAY BEFORE BREAKFAST, Disp: 15 tablet, Rfl: 0   ENTRESTO 97-103 MG, TAKE 1 TABLET BY MOUTH TWICE A DAY, Disp: 180 tablet, Rfl: 3   icosapent Ethyl (VASCEPA) 1  g capsule, TAKE 2 CAPSULES BY MOUTH TWICE A DAY, Disp: 360 capsule, Rfl: 0   metFORMIN (GLUCOPHAGE) 1000 MG tablet, Take 0.5 tablets (500 mg total) by mouth 2 (two) times daily with a meal., Disp: 90 tablet, Rfl: 3   metoprolol succinate (TOPROL-XL) 25 MG 24 hr tablet, TAKE 1 TABLET (25 MG TOTAL) BY MOUTH DAILY., Disp: 90 tablet, Rfl: 3   nitroGLYCERIN (NITROSTAT) 0.4 MG SL tablet, DISSOLVE 1 TABLET UNDER THE   TONGUE EVERY 5 MINUTES AS NEEDED FOR CHEST PAIN. MAX OF 3 TABLETS IN 15 MINUTES. CALL 911 IF PAIN  PERSISTS., Disp: 25 tablet, Rfl: 2   trimethoprim-polymyxin b (POLYTRIM) ophthalmic solution, Apply 1-2 drops into affected eye QID x 5 days., Disp: 10 mL, Rfl: 0  Observations/Objective: Patient is well-developed, well-nourished in no acute distress.  Resting comfortably  at home.  Head is normocephalic, atraumatic.  No labored breathing.  Speech is clear and coherent with logical content.  Patient is alert and oriented at baseline.    Assessment and Plan: 1. Viral URI with cough (Primary)  patient presents with symptoms suspicious for likely viral upper respiratory infection. Differential includes bacterial pneumonia, sinusitis, allergic rhinitis. Do not suspect underlying cardiopulmonary process. I considered, but think unlikely, dangerous causes of this patient's symptoms to include ACS, CHF or COPD exacerbations, pneumonia, pneumothorax. Patient is nontoxic appearing and not in need of emergent medical intervention.  Plan: reassurance, reassessment, over the counter medications, discharge with PCP followup  Follow Up Instructions: I discussed the assessment and treatment plan with the patient. The patient was provided an opportunity to ask questions and all were answered. The patient agreed with the plan and demonstrated an understanding of the instructions.  A copy of instructions were sent to the patient via MyChart unless otherwise noted below.    The patient was advised to call back or seek an in-person evaluation if the symptoms worsen or if the condition fails to improve as anticipated.    Laure Kidney, PA-C

## 2023-11-26 ENCOUNTER — Ambulatory Visit: Payer: 59 | Admitting: Podiatry

## 2023-11-26 ENCOUNTER — Encounter: Payer: Self-pay | Admitting: Podiatry

## 2023-11-26 ENCOUNTER — Ambulatory Visit (INDEPENDENT_AMBULATORY_CARE_PROVIDER_SITE_OTHER): Payer: 59

## 2023-11-26 DIAGNOSIS — M79672 Pain in left foot: Secondary | ICD-10-CM

## 2023-11-26 DIAGNOSIS — M205X1 Other deformities of toe(s) (acquired), right foot: Secondary | ICD-10-CM

## 2023-11-26 DIAGNOSIS — M722 Plantar fascial fibromatosis: Secondary | ICD-10-CM

## 2023-11-26 MED ORDER — DICLOFENAC SODIUM 75 MG PO TBEC: 75.0000 mg | DELAYED_RELEASE_TABLET | Freq: Two times a day (BID) | ORAL | 2 refills | Status: DC

## 2023-11-26 MED ORDER — TRIAMCINOLONE ACETONIDE 10 MG/ML IJ SUSP
10.0000 mg | Freq: Once | INTRAMUSCULAR | Status: AC
  Administered 2023-11-26: 10 mg via INTRA_ARTICULAR

## 2023-11-26 NOTE — Patient Instructions (Signed)

## 2023-11-28 NOTE — Progress Notes (Signed)
 Subjective:   Patient ID: James Francis, male   DOB: 53 y.o.   MRN: [id number]   HPI Patient presents stating he has developed a lot of pain in his left arch over the last few months and has a lot of discomfort in the right big toe joint that has been going on for about 4 years gradually getting worse.  Patient states that he has tried shoe gear modifications anti-inflammatories as needed.  Patient does not smoke likes to be active   Review of Systems  All other systems reviewed and are negative.       Objective:  Physical Exam Vitals and nursing note reviewed.  Constitutional:      Appearance: He is well-developed.  Pulmonary:     Effort: Pulmonary effort is normal.  Musculoskeletal:        General: Normal range of motion.  Skin:    General: Skin is warm.  Neurological:     Mental Status: He is alert.     Neurovascular status intact muscle strength found to be adequate range of motion adequate with exquisite discomfort range of motion loss of the first MPJ right with fluid buildup around the joint surface and no crepitus.  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  X-rays taken reviewed and today for the left I did sterile prep and I injected the plantar fascia at insertion 3 mg Kenalog  5 mg Xylocaine  and applied fascial brace to lift up the arch.  For the right I did discuss the elongation process the beginning of structural arthritis and that at 1 point this will require a biplanar type osteotomy and I do think we can save the joint.  He is interested in this and  I reviewed recovery and we will discuss in greater detail in 2 weeks.  Patient will be seen back and both conditions discussed  X-rays indicated elongation first metatarsal segment right over left with moderate arthritis around the first MPJ right and spur formation with spur in the plantar heel left over right

## 2023-11-29 ENCOUNTER — Other Ambulatory Visit: Payer: Self-pay | Admitting: Cardiovascular Disease

## 2023-12-04 ENCOUNTER — Other Ambulatory Visit: Payer: Self-pay | Admitting: Cardiovascular Disease

## 2023-12-05 MED ORDER — EMPAGLIFLOZIN 10 MG PO TABS: 10.0000 mg | ORAL_TABLET | Freq: Every day | ORAL | 0 refills | Status: DC

## 2023-12-10 ENCOUNTER — Ambulatory Visit: Payer: 59 | Admitting: Podiatry

## 2023-12-10 ENCOUNTER — Encounter: Payer: Self-pay | Admitting: Podiatry

## 2023-12-10 DIAGNOSIS — M205X1 Other deformities of toe(s) (acquired), right foot: Secondary | ICD-10-CM

## 2023-12-10 DIAGNOSIS — M722 Plantar fascial fibromatosis: Secondary | ICD-10-CM | POA: Diagnosis not present

## 2023-12-11 ENCOUNTER — Encounter: Payer: Self-pay | Admitting: Cardiovascular Disease

## 2023-12-11 ENCOUNTER — Ambulatory Visit: Payer: 59 | Attending: Cardiovascular Disease | Admitting: Cardiovascular Disease

## 2023-12-11 VITALS — BP 112/70 | HR 49 | Ht 72.0 in | Wt 220.6 lb

## 2023-12-11 DIAGNOSIS — E119 Type 2 diabetes mellitus without complications: Secondary | ICD-10-CM | POA: Diagnosis not present

## 2023-12-11 DIAGNOSIS — E782 Mixed hyperlipidemia: Secondary | ICD-10-CM

## 2023-12-11 DIAGNOSIS — I2510 Atherosclerotic heart disease of native coronary artery without angina pectoris: Secondary | ICD-10-CM

## 2023-12-11 MED ORDER — EMPAGLIFLOZIN 10 MG PO TABS: 10.0000 mg | ORAL_TABLET | Freq: Every day | ORAL | 3 refills | Status: DC

## 2023-12-11 NOTE — Progress Notes (Signed)
Cardiology Office Note:    Date:  12/11/2023   ID:  James Francis, DOB 02-03-1971, MRN [id number]  PCP:  Aviva Kluver   [facility] Providers Cardiologist:  Tonny Bollman, MD     Referring MD: No ref. provider found   Chief Complaint  Patient presents with   Coronary Artery Disease    History of Present Illness:    James Francis is a 53 y.o. male with a hx of coronary artery disease, presenting for follow-up evaluation.  The patient initially presented in 2022 with non-STEMI and was found to have an occluded LAD treated with PCI.  LVEF initially was reduced at 35 to 40% but normalized on follow-up echo imaging at 60 to 65%.  Medical comorbidities include hypertension, mixed hyperlipidemia, and type 2 diabetes.  Last ischemic testing in 2023 with a Myoview scan showed normal myocardial perfusion and normal LVEF.  The patient is here alone today.  He is doing well and has no cardiac-related complaints. Today, he denies symptoms of palpitations, chest pain, shortness of breath, orthopnea, PND, lower extremity edema, dizziness, or syncope.  He continues to work long hours at [employer], but not as much is in the past.  He is walking 10,000 steps per day and he is exercising on the weekends with no exertional symptoms.  He has not established with a primary care physician yet but has an appointment next week.   Current Medications: Current Meds  Medication Sig   aspirin 81 MG chewable tablet Chew 1 tablet (81 mg total) by mouth daily.   atorvastatin (LIPITOR) 80 MG tablet TAKE 1 TABLET BY MOUTH ONCE  DAILY   diclofenac (VOLTAREN) 75 MG EC tablet Take 1 tablet (75 mg total) by mouth 2 (two) times daily.   empagliflozin (JARDIANCE) 10 MG TABS tablet Take 1 tablet (10 mg total) by mouth daily. TAKE 1 TABLET BY MOUTH EVERY DAY BEFORE BREAKFAST   ENTRESTO 97-103 MG TAKE 1 TABLET BY MOUTH TWICE A DAY   icosapent Ethyl (VASCEPA) 1 g capsule TAKE 2 CAPSULES BY MOUTH TWICE A DAY    metoprolol succinate (TOPROL-XL) 25 MG 24 hr tablet TAKE 1 TABLET (25 MG TOTAL) BY MOUTH DAILY.   nitroGLYCERIN (NITROSTAT) 0.4 MG SL tablet DISSOLVE 1 TABLET UNDER THE  TONGUE EVERY 5 MINUTES AS NEEDED FOR CHEST PAIN. MAX OF 3 TABLETS IN 15 MINUTES. CALL 911 IF PAIN  PERSISTS.   [DISCONTINUED] benzonatate (TESSALON) 100 MG capsule Take 1 capsule (100 mg total) by mouth 2 (two) times daily as needed for cough.   [DISCONTINUED] empagliflozin (JARDIANCE) 10 MG TABS tablet Take 1 tablet (10 mg total) by mouth daily. TAKE 1 TABLET BY MOUTH EVERY DAY BEFORE BREAKFAST   [DISCONTINUED] fluticasone (FLONASE) 50 MCG/ACT nasal spray Place 2 sprays into both nostrils daily.     Allergies:   Patient has no known allergies.   ROS:   Please see the history of present illness.    All other systems reviewed and are negative.  EKGs/Labs/Other Studies Reviewed:    The following studies were reviewed today: Cardiac Studies & Procedures   CARDIAC CATHETERIZATION  CARDIAC CATHETERIZATION 12/14/2020  Narrative  There is moderate to severe left ventricular systolic dysfunction.  LV end diastolic pressure is mildly elevated.  The left ventricular ejection fraction is 25-35% by visual estimate.  There is no aortic valve stenosis.  Dist RCA lesion is 50% stenosed.  2nd Mrg lesion is 50% stenosed.  Mid LAD lesion is 100% stenosed.  A drug-eluting stent was successfully placed using a STENT RESOLUTE ONYX 3.5X30.  Post intervention, there is a 0% residual stenosis.  Pain resolved after PCI.  Sx were intermittent over the prior three days.  Hopefully he will have recovery of LV function with revascularization.  Continue aggressive secondary prevention.  Medical therapy for LV dysfunction.  Check echo in AM.  Findings Coronary Findings Diagnostic  Dominance: Right  Left Anterior Descending Collaterals Dist LAD filled by collaterals from RPDA.  Mid LAD lesion is 100% stenosed.  Left  Circumflex There is mild diffuse disease throughout the vessel.  Second Obtuse Marginal Branch 2nd Mrg lesion is 50% stenosed.  Right Coronary Artery There is mild diffuse disease throughout the vessel. Dist RCA lesion is 50% stenosed.  Intervention  Mid LAD lesion Stent CATH LAUNCHER 6FR EBU3.5 guide catheter was inserted. Lesion crossed with guidewire using a WIRE ASAHI PROWATER 180CM. Pre-stent angioplasty was performed using a BALLOON SAPPHIRE 2.5X15. A drug-eluting stent was successfully placed using a STENT RESOLUTE ONYX 3.5X30. Stent strut is well apposed. Post-stent angioplasty was performed using a BALLOON SAPPHIRE [place] 4.0X12. IVUS was done after predilatation and after the stent was postdilated.  Several doses of IC verapamil and IC NTG were given. Post-Intervention Lesion Assessment The intervention was successful. Pre-interventional TIMI flow is 0. Post-intervention TIMI flow is 3. No complications occurred at this lesion. Ultrasound (IVUS) was performed on the lesion post PCI. Stent well apposed. There is a 0% residual stenosis post intervention.   STRESS TESTS  MYOCARDIAL PERFUSION IMAGING 09/04/2022  Narrative   The study is normal. The study is low risk.   No ST deviation was noted.   Left ventricular function is normal. Nuclear stress EF: 54 %. The left ventricular ejection fraction is mildly decreased (45-54%). End diastolic cavity size is normal. End systolic cavity size is normal.   Prior study not available for comparison.  Normal resting and stress perfusion. No ischemia or infarction EF 54% Note patient had frequent PVCls in recovery  ECHOCARDIOGRAM  ECHOCARDIOGRAM COMPLETE 03/28/2021  Narrative ECHOCARDIOGRAM REPORT    Patient Name:   James Francis Date of Exam: 03/28/2021 Medical Rec #:  [id number]    Height:       72.0 in Accession #:    [id number]   Weight:       209.9 lb Date of Birth:  01-30-1971     BSA:          2.175 m Patient Age:    50 years      BP:           108/70 mmHg Patient Gender: M            HR:           60 bpm. Exam Location:  Outpatient  Procedure: 2D Echo, Cardiac Doppler and Color Doppler  Indications:    CHF  History:        Patient has prior history of Echocardiogram examinations, most recent 12/14/2018. CHF, CAD and Previous Myocardial Infarction; Risk Factors:Hypertension, Dyslipidemia and Diabetes.  Sonographer:    Lavenia Atlas Referring Phys: [phone] Roxy Horseman SIMMONS  IMPRESSIONS   1. Left ventricular ejection fraction, by estimation, is 60 to 65%. The left ventricle has normal function. The left ventricle has no regional wall motion abnormalities. There is mild concentric left ventricular hypertrophy. Left ventricular diastolic parameters are consistent with Grade I diastolic dysfunction (impaired relaxation). 2. Right ventricular systolic function is normal. The right ventricular size is normal.  3. The mitral valve is normal in structure. Trivial mitral valve regurgitation. No evidence of mitral stenosis. 4. The aortic valve is tricuspid. Aortic valve regurgitation is mild. No aortic stenosis is present. 5. Aortic dilatation noted. There is mild dilatation of the aortic root, measuring 39 mm. 6. The inferior vena cava is normal in size with greater than 50% respiratory variability, suggesting right atrial pressure of 3 mmHg.  Comparison(s): Compared to prior TTE on 12/14/20, the LVEF has improved to 60-65% and the anteroseptal WMA is no longer present.  FINDINGS Left Ventricle: Left ventricular ejection fraction, by estimation, is 60 to 65%. The left ventricle has normal function. The left ventricle has no regional wall motion abnormalities. The left ventricular internal cavity size was normal in size. There is mild concentric left ventricular hypertrophy. Left ventricular diastolic parameters are consistent with Grade I diastolic dysfunction (impaired relaxation). Normal left ventricular filling  pressure.  Right Ventricle: The right ventricular size is normal. No increase in right ventricular wall thickness. Right ventricular systolic function is normal.  Left Atrium: Left atrial size was normal in size.  Right Atrium: Right atrial size was normal in size.  Pericardium: There is no evidence of pericardial effusion.  Mitral Valve: The mitral valve is normal in structure. Trivial mitral valve regurgitation. No evidence of mitral valve stenosis.  Tricuspid Valve: The tricuspid valve is normal in structure. Tricuspid valve regurgitation is trivial.  Aortic Valve: The aortic valve is tricuspid. Aortic valve regurgitation is mild. Aortic regurgitation PHT measures 518 msec. No aortic stenosis is present.  Pulmonic Valve: The pulmonic valve was normal in structure. Pulmonic valve regurgitation is trivial.  Aorta: Aortic dilatation noted. There is mild dilatation of the aortic root, measuring 39 mm.  Venous: The inferior vena cava is normal in size with greater than 50% respiratory variability, suggesting right atrial pressure of 3 mmHg.  IAS/Shunts: No atrial level shunt detected by color flow Doppler.   LEFT VENTRICLE PLAX 2D LVIDd:         4.50 cm      Diastology LVIDs:         2.60 cm      LV e' medial:    4.90 cm/s LV PW:         1.20 cm      LV E/e' medial:  11.8 LV IVS:        1.20 cm      LV e' lateral:   7.51 cm/s LVOT diam:     2.55 cm      LV E/e' lateral: 7.7 LV SV:         100 LV SV Index:   46 LVOT Area:     5.11 cm  LV Volumes (MOD) LV vol d, MOD A4C: 144.0 ml LV vol s, MOD A4C: 66.4 ml LV SV MOD A4C:     144.0 ml  RIGHT VENTRICLE RV Basal diam:  2.60 cm RV S prime:     9.68 cm/s TAPSE (M-mode): 2.3 cm  LEFT ATRIUM             Index       RIGHT ATRIUM           Index LA diam:        3.80 cm 1.75 cm/m  RA Area:     14.90 cm LA Vol (A2C):   42.0 ml 19.31 ml/m RA Volume:   33.20 ml  15.27 ml/m LA Vol (A4C):   50.1 ml 23.04 ml/m  LA Biplane Vol: 45.8  ml 21.06 ml/m AORTIC VALVE LVOT Vmax:   97.50 cm/s LVOT Vmean:  67.300 cm/s LVOT VTI:    0.195 m AI PHT:      518 msec  AORTA Ao Root diam: 3.50 cm  MITRAL VALVE MV Area (PHT): 2.83 cm    SHUNTS MV Decel Time: 268 msec    Systemic VTI:  0.20 m MV E velocity: 57.80 cm/s  Systemic Diam: 2.55 cm MV A velocity: 51.80 cm/s MV E/A ratio:  1.12  Laurance Flatten MD Electronically signed by Laurance Flatten MD Signature Date/Time: 03/28/2021/10:59:14 AM    Final             EKG:        Recent Labs: No results found for requested labs within last 365 days.  Recent Lipid Panel    Component Value Date/Time   CHOL 95 (L) 09/04/2022 0755   TRIG 66 09/04/2022 0755   HDL 47 09/04/2022 0755   CHOLHDL 2.0 09/04/2022 0755   CHOLHDL 4.1 12/14/2020 0341   VLDL 69 (H) 12/14/2020 0341   LDLCALC 33 09/04/2022 0755   LDLCALC 66 10/04/2020 0826     Risk Assessment/Calculations:                Physical Exam:    VS:  BP 112/70   Pulse (!) 49   Ht 6' (1.829 m)   Wt 220 lb 9.6 oz (100.1 kg)   SpO2 97%   BMI 29.92 kg/m     Wt Readings from Last 3 Encounters:  12/11/23 220 lb 9.6 oz (100.1 kg)  09/04/22 210 lb (95.3 kg)  08/28/22 210 lb (95.3 kg)     GEN:  Well nourished, well developed in no acute distress HEENT: Normal NECK: No JVD; No carotid bruits LYMPHATICS: No lymphadenopathy CARDIAC: Hello [employer] how you doing RRR, no murmurs, rubs, gallops RESPIRATORY:  Clear to auscultation without rales, wheezing or rhonchi  ABDOMEN: Soft, non-tender, non-distended MUSCULOSKELETAL:  No edema; No deformity  SKIN: Warm and dry NEUROLOGIC:  Alert and oriented x 3 PSYCHIATRIC:  Normal affect   Assessment & Plan Coronary artery disease involving native coronary artery of native heart without angina pectoris Patient is stable with no symptoms of angina.  He remains on aspirin and high intensity statin drug.  Continue current management.  Last stress test reviewed as  above. Diabetes mellitus without complication (HCC) Check hemoglobin A1c today.  Last hemoglobin A1c was 6.7.  He is going to establish with primary care in the near future.  Will check a complete metabolic panel as well. Mixed hyperlipidemia Treated with a high intensity statin drug.  LDL cholesterol at last check was 33.  He is fasting today for repeat labs.  Discussed exercise and lifestyle modification.      Medication Adjustments/Labs and Tests Ordered: Current medicines are reviewed at length with the patient today.  Concerns regarding medicines are outlined above.  Orders Placed This Encounter  Procedures   CBC   Comprehensive metabolic panel   Hemoglobin A1c   Lipid panel   EKG 12-Lead   Meds ordered this encounter  Medications   empagliflozin (JARDIANCE) 10 MG TABS tablet    Sig: Take 1 tablet (10 mg total) by mouth daily. TAKE 1 TABLET BY MOUTH EVERY DAY BEFORE BREAKFAST    Dispense:  90 tablet    Refill:  3    Pt needs to keep upcoming appt Jan, 2025 for 90 day supply  Patient Instructions  Lab Work: Today: CBC, CMP, Lipid panel, A1C If you have labs (blood work) drawn today and your tests are completely normal, you will receive your results only by: MyChart Message (if you have MyChart) OR A paper copy in the mail If you have any lab test that is abnormal or we need to change your treatment, we will call you to review the results.   Follow-Up: At [facility], you and your health needs are our priority.  As part of our continuing mission to provide you with exceptional heart care, we have created designated Provider Care Teams.  These Care Teams include your primary Cardiologist (physician) and Advanced Practice Providers (APPs -  Physician Assistants and Nurse Practitioners) who all work together to provide you with the care you need, when you need it.  We recommend signing up for the patient portal called "MyChart".  Sign up information is provided on  this After Visit Summary.  MyChart is used to connect with patients for Virtual Visits (Telemedicine).  Patients are able to view lab/test results, encounter notes, upcoming appointments, etc.  Non-urgent messages can be sent to your provider as well.   To learn more about what you can do with MyChart, go to [email or web address].    Your next appointment:   1 year(s)  Provider:   Tonny Bollman, MD     Other Instructions   1st Floor: - Lobby - Registration  - Pharmacy  - Lab - Cafe  2nd Floor: - PV Lab - Diagnostic Testing (echo, CT, nuclear med)  3rd Floor: - Vacant  4th Floor: - TCTS (cardiothoracic surgery) - AFib Clinic - Structural Heart Clinic - Vascular Surgery  - Vascular Ultrasound  5th Floor: - HeartCare Cardiology (general and EP) - Clinical Pharmacy for coumadin, hypertension, lipid, weight-loss medications, and med management appointments    Valet parking services will be available as well.          Signed, Tonny Bollman, MD  12/11/2023 1:43 PM    [facility]

## 2023-12-11 NOTE — Assessment & Plan Note (Signed)
Treated with a high intensity statin drug.  LDL cholesterol at last check was 33.  He is fasting today for repeat labs.  Discussed exercise and lifestyle modification.

## 2023-12-11 NOTE — Assessment & Plan Note (Signed)
Patient is stable with no symptoms of angina.  He remains on aspirin and high intensity statin drug.  Continue current management.  Last stress test reviewed as above.

## 2023-12-11 NOTE — Patient Instructions (Signed)
Lab Work: Today: CBC, CMP, Lipid panel, A1C If you have labs (blood work) drawn today and your tests are completely normal, you will receive your results only by: MyChart Message (if you have MyChart) OR A paper copy in the mail If you have any lab test that is abnormal or we need to change your treatment, we will call you to review the results.   Follow-Up: At [facility], you and your health needs are our priority.  As part of our continuing mission to provide you with exceptional heart care, we have created designated Provider Care Teams.  These Care Teams include your primary Cardiologist (physician) and Advanced Practice Providers (APPs -  Physician Assistants and Nurse Practitioners) who all work together to provide you with the care you need, when you need it.  We recommend signing up for the patient portal called "MyChart".  Sign up information is provided on this After Visit Summary.  MyChart is used to connect with patients for Virtual Visits (Telemedicine).  Patients are able to view lab/test results, encounter notes, upcoming appointments, etc.  Non-urgent messages can be sent to your provider as well.   To learn more about what you can do with MyChart, go to [email or web address].    Your next appointment:   1 year(s)  Provider:   Tonny Bollman, MD     Other Instructions   1st Floor: - Lobby - Registration  - Pharmacy  - Lab - Cafe  2nd Floor: - PV Lab - Diagnostic Testing (echo, CT, nuclear med)  3rd Floor: - Vacant  4th Floor: - TCTS (cardiothoracic surgery) - AFib Clinic - Structural Heart Clinic - Vascular Surgery  - Vascular Ultrasound  5th Floor: - HeartCare Cardiology (general and EP) - Clinical Pharmacy for coumadin, hypertension, lipid, weight-loss medications, and med management appointments    Valet parking services will be available as well.

## 2023-12-11 NOTE — Assessment & Plan Note (Signed)
Check hemoglobin A1c today.  Last hemoglobin A1c was 6.7.  He is going to establish with primary care in the near future.  Will check a complete metabolic panel as well.

## 2023-12-12 LAB — COMPREHENSIVE METABOLIC PANEL
ALT: 25 [IU]/L (ref 0–44)
AST: 17 [IU]/L (ref 0–40)
Albumin: 4.5 g/dL (ref 3.8–4.9)
Alkaline Phosphatase: 53 [IU]/L (ref 44–121)
BUN/Creatinine Ratio: 19 (ref 9–20)
BUN: 19 mg/dL (ref 6–24)
Bilirubin Total: 1 mg/dL (ref 0.0–1.2)
CO2: 23 mmol/L (ref 20–29)
Calcium: 9 mg/dL (ref 8.7–10.2)
Chloride: 105 mmol/L (ref 96–106)
Creatinine, Ser: 0.99 mg/dL (ref 0.76–1.27)
Globulin, Total: 1.9 g/dL (ref 1.5–4.5)
Glucose: 105 mg/dL — ABNORMAL HIGH (ref 70–99)
Potassium: 4.4 mmol/L (ref 3.5–5.2)
Sodium: 142 mmol/L (ref 134–144)
Total Protein: 6.4 g/dL (ref 6.0–8.5)
eGFR: 92 mL/min/{1.73_m2} (ref >59)

## 2023-12-12 LAB — HEMOGLOBIN A1C
Est. average glucose Bld gHb Est-mCnc: 174 mg/dL
Hgb A1c MFr Bld: 7.7 % — ABNORMAL HIGH (ref 4.8–5.6)

## 2023-12-12 LAB — CBC
Hematocrit: 48 % (ref 37.5–51.0)
Hemoglobin: 15.5 g/dL (ref 13.0–17.7)
MCH: 28.4 pg (ref 26.6–33.0)
MCHC: 32.3 g/dL (ref 31.5–35.7)
MCV: 88 fL (ref 79–97)
Platelets: 200 10*3/uL (ref 150–450)
RBC: 5.45 x10E6/uL (ref 4.14–5.80)
RDW: 12.9 % (ref 11.6–15.4)
WBC: 7.1 10*3/uL (ref 3.4–10.8)

## 2023-12-12 LAB — LIPID PANEL
Chol/HDL Ratio: 2.9 {ratio} (ref 0.0–5.0)
Cholesterol, Total: 120 mg/dL (ref 100–199)
HDL: 42 mg/dL (ref >39)
LDL Chol Calc (NIH): 55 mg/dL (ref 0–99)
Triglycerides: 132 mg/dL (ref 0–149)
VLDL Cholesterol Cal: 23 mg/dL (ref 5–40)

## 2023-12-12 NOTE — Progress Notes (Signed)
Subjective:   Patient ID: James Francis, male   DOB: 53 y.o.   MRN: [id number]   HPI patient states that his left heel is feeling quite a bit better and he knows he is gena need surgery on the big toe joint of his right foot and wants to figure out the best time to schedule this    ROS      Objective:  Physical Exam  Neurovascular status intact significant diminishment of discomfort plantar heel left with reduced range of motion first MPJ right with swelling around the joint inflammation of the joint surface pain     Assessment:  Improving from plantar fascial inflammation left with chronic hallux limitus deformity right with reduced range of motion of the joint     Plan:  H&P reviewed both conditions and discussed continued stretching exercises good support therapy for the left and for the right reviewed at great length the thought of a biplanar type osteotomy shortening the bone and fixation.  I did explain that I do think we can save this joint cannot say 100% for sure but I feel confident and I reviewed the recovery process.  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

## 2023-12-13 ENCOUNTER — Other Ambulatory Visit: Payer: Self-pay | Admitting: Cardiovascular Disease

## 2023-12-20 ENCOUNTER — Ambulatory Visit (HOSPITAL_BASED_OUTPATIENT_CLINIC_OR_DEPARTMENT_OTHER): Payer: 59 | Admitting: Family Medicine

## 2023-12-20 ENCOUNTER — Encounter (HOSPITAL_BASED_OUTPATIENT_CLINIC_OR_DEPARTMENT_OTHER): Payer: Self-pay | Admitting: Family Medicine

## 2023-12-20 VITALS — BP 98/62 | HR 54 | Ht 72.0 in | Wt 215.0 lb

## 2023-12-20 DIAGNOSIS — E1165 Type 2 diabetes mellitus with hyperglycemia: Secondary | ICD-10-CM

## 2023-12-20 DIAGNOSIS — E119 Type 2 diabetes mellitus without complications: Secondary | ICD-10-CM

## 2023-12-20 DIAGNOSIS — Z1211 Encounter for screening for malignant neoplasm of colon: Secondary | ICD-10-CM | POA: Diagnosis not present

## 2023-12-20 DIAGNOSIS — N529 Male erectile dysfunction, unspecified: Secondary | ICD-10-CM

## 2023-12-20 DIAGNOSIS — Z7984 Long term (current) use of oral hypoglycemic drugs: Secondary | ICD-10-CM

## 2023-12-20 MED ORDER — SILDENAFIL CITRATE 50 MG PO TABS: 50.0000 mg | ORAL_TABLET | Freq: Every day | ORAL | 0 refills | Status: DC | PRN

## 2023-12-20 MED ORDER — METFORMIN HCL 500 MG PO TABS: 1000.0000 mg | ORAL_TABLET | Freq: Two times a day (BID) | ORAL | 1 refills | Status: DC

## 2023-12-20 MED ORDER — DICLOFENAC SODIUM 75 MG PO TBEC: 75.0000 mg | DELAYED_RELEASE_TABLET | Freq: Two times a day (BID) | ORAL | 2 refills | Status: AC

## 2023-12-20 NOTE — Assessment & Plan Note (Signed)
 Patient reports issues with erectile dysfunction since heart attack in 2022.  We discussed options and we can proceed with pharmacotherapy today.  Discussed potential risks associated with class of medication.  Discussed that there is increased risk of some the side effects given patient's current blood pressure and medication regimen.  Patient voiced understanding.  Also discussed risk of priapism and what to do if this does occur

## 2023-12-20 NOTE — Assessment & Plan Note (Signed)
 A1c elevated at 7.7%, discussed goal of achieving hemoglobin A1c less than 7.0%.  We discussed options today Can continue with Jardiance , we will resume metformin , will start with 500 mg dose twice daily with gradual titration to 1000 mg twice daily. Plan to recheck hemoglobin A1c in 3 months to assess progress Will check urine microalbumin/creatinine ratio today Plan to complete foot exam at future office visit

## 2023-12-20 NOTE — Progress Notes (Signed)
 New Patient Office Visit  Subjective   Patient ID: James Francis, male    DOB: December 09, 1970  Age: 53 y.o. MRN: [id number]  CC:  Chief Complaint  Patient presents with   New Patient (Initial Visit)    New patient has been having back pain foot pain joint pain for a long time     HPI James Francis presents to establish care Last PCP - Dr. Bari  DM: Currently utilizing Jardiance  for diabetes as well as history of heart disease/HF.  Has utilized metformin  in the past, however reports that medication was not refilled and prescription has run out.  Denies having any issues with metformin  when utilizing this in the past.  Did have hemoglobin A1c checked with cardiologist recently and was 7.7%.  CAD, HTN: Does follow cardiology.  Had NSTEMI in 2022.  Current medications include ASA 81, atorvastatin , Jardiance , Entresto , Vascepa , metoprolol .  Denies any issues with medication currently.  Does follow with cardiology regularly, most recent appointment was last week.  Patient is originally from [place], has lived here for about 11 years. Patient works as art gallery manager at [employer]. He enjoys cycling.  Outpatient Encounter Medications as of 12/20/2023  Medication Sig   aspirin  81 MG chewable tablet Chew 1 tablet (81 mg total) by mouth daily.   atorvastatin  (LIPITOR ) 80 MG tablet TAKE 1 TABLET BY MOUTH ONCE  DAILY   empagliflozin  (JARDIANCE ) 10 MG TABS tablet Take 1 tablet (10 mg total) by mouth daily. TAKE 1 TABLET BY MOUTH EVERY DAY BEFORE BREAKFAST   ENTRESTO  97-103 MG TAKE 1 TABLET BY MOUTH TWICE A DAY   icosapent  Ethyl (VASCEPA ) 1 g capsule TAKE 2 CAPSULES BY MOUTH TWICE A DAY   metFORMIN  (GLUCOPHAGE ) 500 MG tablet Take 2 tablets (1,000 mg total) by mouth 2 (two) times daily with a meal.   metoprolol  succinate (TOPROL -XL) 25 MG 24 hr tablet TAKE 1 TABLET (25 MG TOTAL) BY MOUTH DAILY.   nitroGLYCERIN  (NITROSTAT ) 0.4 MG SL tablet DISSOLVE 1 TABLET UNDER THE  TONGUE EVERY 5 MINUTES AS NEEDED  FOR CHEST PAIN. MAX OF 3 TABLETS IN 15 MINUTES. CALL 911 IF PAIN  PERSISTS.   sildenafil  (VIAGRA ) 50 MG tablet Take 1 tablet (50 mg total) by mouth daily as needed for erectile dysfunction.   [DISCONTINUED] diclofenac  (VOLTAREN ) 75 MG EC tablet Take 1 tablet (75 mg total) by mouth 2 (two) times daily.   diclofenac  (VOLTAREN ) 75 MG EC tablet Take 1 tablet (75 mg total) by mouth 2 (two) times daily.   No facility-administered encounter medications on file as of 12/20/2023.    Past Medical History:  Diagnosis Date   CHF (congestive heart failure) (HCC)    Coronary artery disease    Diabetes mellitus without complication (HCC)    Hearing loss    High frequency bilateral   History of broken nose    Multiple- has deviated septum   History of colon polyps    Hyperlipidemia    Hypertension    Ruptured lumbar disc    L4-L5 as a child    Past Surgical History:  Procedure Laterality Date   CARDIAC CATHETERIZATION     COLONOSCOPY     CORONARY STENT INTERVENTION N/A 12/14/2020   Procedure: CORONARY STENT INTERVENTION;  Surgeon: Dann Candyce RAMAN, MD;  Location: [facility] INVASIVE CV LAB;  Service: Cardiovascular;  Laterality: N/A;   CORONARY ULTRASOUND/IVUS N/A 12/14/2020   Procedure: Intravascular Ultrasound/IVUS;  Surgeon: Dann Candyce RAMAN, MD;  Location: [facility] INVASIVE CV  LAB;  Service: Cardiovascular;  Laterality: N/A;   LEFT HEART CATH AND CORONARY ANGIOGRAPHY N/A 12/14/2020   Procedure: LEFT HEART CATH AND CORONARY ANGIOGRAPHY;  Surgeon: Dann Candyce RAMAN, MD;  Location: [facility] INVASIVE CV LAB;  Service: Cardiovascular;  Laterality: N/A;   NO PAST SURGERIES      Family History  Problem Relation Age of Onset   Cancer Mother        unknown type   Kidney Stones Mother    Heart disease Father        MI age 63, triple Bypass/ death age 54   Cancer Maternal Grandmother        Ovarian   Heart disease Paternal Grandfather        MI   Diabetes Paternal Grandfather    Crohn's disease Paternal  Aunt    Colon cancer Neg Hx    Stomach cancer Neg Hx    Rectal cancer Neg Hx    Esophageal cancer Neg Hx    Liver cancer Neg Hx     Social History   Socioeconomic History   Marital status: Married    Spouse name: Not on file   Number of children: 2   Years of education: 16   Highest education level: Bachelor's degree (e.g., BA, AB, BS)  Occupational History   Occupation: Art Gallery Manager  Tobacco Use   Smoking status: Never    Passive exposure: Never   Smokeless tobacco: Never  Vaping Use   Vaping status: Never Used  Substance and Sexual Activity   Alcohol use: Yes    Alcohol/week: 0.0 standard drinks of alcohol    Comment: occasionally    Drug use: No   Sexual activity: Yes  Other Topics Concern   Not on file  Social History Narrative   Not on file   Social Drivers of Health   Financial Resource Strain: Low Risk  (12/15/2020)   Overall Financial Resource Strain (CARDIA)    Difficulty of Paying Living Expenses: Not hard at all  Food Insecurity: No Food Insecurity (12/15/2020)   Hunger Vital Sign    Worried About Running Out of Food in the Last Year: Never true    Ran Out of Food in the Last Year: Never true  Transportation Needs: No Transportation Needs (12/15/2020)   PRAPARE - Administrator, Civil Service (Medical): No    Lack of Transportation (Non-Medical): No  Physical Activity: Not on file  Stress: Not on file  Social Connections: Not on file  Intimate Partner Violence: Not At Risk (12/15/2020)   Humiliation, Afraid, Rape, and Kick questionnaire    Fear of Current or Ex-Partner: No    Emotionally Abused: No    Physically Abused: No    Sexually Abused: No    Objective   BP 98/62 (BP Location: Right Arm, Patient Position: Sitting, Cuff Size: Normal)   Pulse (!) 54   Ht 6' (1.829 m)   Wt 215 lb (97.5 kg)   SpO2 95%   BMI 29.16 kg/m   Physical Exam  53 year old male in no acute distress Cardiovascular exam with regular rate and rhythm Lungs clear  to auscultation bilaterally  Assessment & Plan:   Encounter for screening colonoscopy -     Ambulatory referral to Gastroenterology  Diabetes mellitus without complication (HCC) -     Microalbumin / creatinine urine ratio  Type 2 diabetes mellitus with hyperglycemia, without long-term current use of insulin  (HCC) Assessment & Plan: A1c elevated at 7.7%, discussed goal of  achieving hemoglobin A1c less than 7.0%.  We discussed options today Can continue with Jardiance , we will resume metformin , will start with 500 mg dose twice daily with gradual titration to 1000 mg twice daily. Plan to recheck hemoglobin A1c in 3 months to assess progress Will check urine microalbumin/creatinine ratio today Plan to complete foot exam at future office visit   Erectile dysfunction, unspecified erectile dysfunction type Assessment & Plan: Patient reports issues with erectile dysfunction since heart attack in 2022.  We discussed options and we can proceed with pharmacotherapy today.  Discussed potential risks associated with class of medication.  Discussed that there is increased risk of some the side effects given patient's current blood pressure and medication regimen.  Patient voiced understanding.  Also discussed risk of priapism and what to do if this does occur   Other orders -     metFORMIN  HCl; Take 2 tablets (1,000 mg total) by mouth 2 (two) times daily with a meal.  Dispense: 360 tablet; Refill: 1 -     Diclofenac  Sodium; Take 1 tablet (75 mg total) by mouth 2 (two) times daily.  Dispense: 50 tablet; Refill: 2 -     Sildenafil  Citrate; Take 1 tablet (50 mg total) by mouth daily as needed for erectile dysfunction.  Dispense: 10 tablet; Refill: 0  Return in about 3 months (around 03/18/2024) for diabetes, med check.    ___________________________________________ Brantlee Hinde [name], MD, ABFM, CAQSM Primary Care and Sports Medicine [facility]

## 2023-12-20 NOTE — Patient Instructions (Addendum)
  Medication Instructions:  Your physician recommends that you continue on your current medications as directed. Please refer to the Current Medication list given to you today. --If you need a refill on any your medications before your next appointment, please call your pharmacy first. If no refills are authorized on file call the office.-- Lab Work: Your physician has recommended that you have lab work today: microalbumin If you have labs (blood work) drawn today and your tests are completely normal, you will receive your results via MyChart message OR a phone call from our staff.  Please ensure you check your voicemail in the event that you authorized detailed messages to be left on a delegated number. If you have any lab test that is abnormal or we need to change your treatment, we will call you to review the results.   Follow-Up: Your next appointment:   Your physician recommends that you schedule a follow-up appointment in: 3 month follow up  with Dr. de [place]  You will receive a text message or e-mail with a link to a survey about your care and experience with [place] today! We would greatly appreciate your feedback!   Thanks for letting us be apart of your health journey!!  Primary Care and Sports Medicine   Dr. Ceasar Mons Peru   We encourage you to activate your patient portal called "MyChart".  Sign up information is provided on this After Visit Summary.  MyChart is used to connect with patients for Virtual Visits (Telemedicine).  Patients are able to view lab/test results, encounter notes, upcoming appointments, etc.  Non-urgent messages can be sent to your provider as well. To learn more about what you can do with MyChart, please visit --  [email or web address].

## 2023-12-21 ENCOUNTER — Other Ambulatory Visit: Payer: Self-pay | Admitting: Cardiovascular Disease

## 2023-12-21 LAB — MICROALBUMIN / CREATININE URINE RATIO
Creatinine, Urine: 174 mg/dL
Microalb/Creat Ratio: 4 mg/g{creat} (ref 0–29)
Microalbumin, Urine: 6.9 ug/mL

## 2023-12-24 ENCOUNTER — Other Ambulatory Visit (HOSPITAL_BASED_OUTPATIENT_CLINIC_OR_DEPARTMENT_OTHER): Payer: Self-pay | Admitting: *Deleted

## 2023-12-26 ENCOUNTER — Other Ambulatory Visit (HOSPITAL_BASED_OUTPATIENT_CLINIC_OR_DEPARTMENT_OTHER): Payer: Self-pay | Admitting: Cardiovascular Disease

## 2023-12-27 ENCOUNTER — Encounter (HOSPITAL_BASED_OUTPATIENT_CLINIC_OR_DEPARTMENT_OTHER): Payer: Self-pay | Admitting: Family Medicine

## 2023-12-30 ENCOUNTER — Other Ambulatory Visit (HOSPITAL_BASED_OUTPATIENT_CLINIC_OR_DEPARTMENT_OTHER): Payer: Self-pay | Admitting: Family Medicine

## 2023-12-31 MED ORDER — SILDENAFIL CITRATE 50 MG PO TABS: 50.0000 mg | ORAL_TABLET | Freq: Every day | ORAL | 1 refills | Status: DC | PRN

## 2024-01-10 LAB — HM DIABETES EYE EXAM

## 2024-01-14 ENCOUNTER — Other Ambulatory Visit (HOSPITAL_BASED_OUTPATIENT_CLINIC_OR_DEPARTMENT_OTHER): Payer: Self-pay | Admitting: Cardiovascular Disease

## 2024-01-16 ENCOUNTER — Other Ambulatory Visit: Payer: Self-pay | Admitting: Cardiovascular Disease

## 2024-01-21 ENCOUNTER — Other Ambulatory Visit: Payer: Self-pay | Admitting: Cardiovascular Disease

## 2024-02-14 ENCOUNTER — Encounter: Payer: Self-pay | Admitting: Family Medicine

## 2024-03-13 ENCOUNTER — Other Ambulatory Visit (HOSPITAL_BASED_OUTPATIENT_CLINIC_OR_DEPARTMENT_OTHER): Payer: Self-pay | Admitting: Family Medicine

## 2024-03-18 ENCOUNTER — Encounter (HOSPITAL_BASED_OUTPATIENT_CLINIC_OR_DEPARTMENT_OTHER): Payer: Self-pay | Admitting: *Deleted

## 2024-03-18 ENCOUNTER — Encounter (HOSPITAL_BASED_OUTPATIENT_CLINIC_OR_DEPARTMENT_OTHER): Payer: Self-pay | Admitting: Family Medicine

## 2024-03-18 ENCOUNTER — Ambulatory Visit (HOSPITAL_BASED_OUTPATIENT_CLINIC_OR_DEPARTMENT_OTHER): Payer: 59 | Admitting: Family Medicine

## 2024-03-18 VITALS — BP 107/70 | HR 60 | Ht 72.0 in | Wt 224.5 lb

## 2024-03-18 DIAGNOSIS — M79673 Pain in unspecified foot: Secondary | ICD-10-CM | POA: Diagnosis not present

## 2024-03-18 DIAGNOSIS — E1165 Type 2 diabetes mellitus with hyperglycemia: Secondary | ICD-10-CM | POA: Diagnosis not present

## 2024-03-18 DIAGNOSIS — Z7984 Long term (current) use of oral hypoglycemic drugs: Secondary | ICD-10-CM

## 2024-03-18 LAB — POCT GLYCOSYLATED HEMOGLOBIN (HGB A1C)
HbA1c POC (<> result, manual entry): 6.7 % (ref 4.0–5.6)
HbA1c, POC (controlled diabetic range): 6.7 % (ref 0.0–7.0)
Hemoglobin A1C: 6.7 % — AB (ref 4.0–5.6)

## 2024-03-18 NOTE — Assessment & Plan Note (Signed)
 Continues to have foot pain.  Has had prior evaluation with podiatry.  He was started on diclofenac  orally by podiatry and continues with this medication.  He indicates that he has been taking this regularly twice a day.  He will note symptoms if he does miss a dose.  He feels that it has been helpful in regards to controlling his pain.  Unfortunately, he is also noted some stomach upset/epigastric discomfort. Reviewed medication considerations with patient.  Discussed that regular use of anti-inflammatory can put patient at risk related to side effects and potential complications from regular NSAID use.  This can include stomach upset, issues affecting lining of stomach.  Given this, would recommend generally trying to limit use of oral NSAID.  If symptoms not appropriately controlled with reduction in use of NSAID, recommend having follow-up with podiatrist in order to review considerations related to management of pain.  He indicates that he will contact podiatrist in order to schedule follow-up.  If any worsening is noted with epigastric discomfort or new GI symptoms noted, recommend returning to the office for further evaluation

## 2024-03-18 NOTE — Assessment & Plan Note (Signed)
 Patient continues with Jardiance  and metformin .  As discussed separately, he has been having some epigastric symptoms, however feel that this is more related to the diclofenac  and less likely related to the metformin .  Prior A1c was slightly above goal, due for recheck today.  Denies any issues with symptoms suggesting notable hyperglycemia or episodes of hypoglycemia. A1c in office today does show improvement and is now at goal at 6.7%.  Given this, can continue with current medication regimen, no changes made today We will plan to follow-up in about 3 to 4 months to monitor progress or sooner as needed

## 2024-03-18 NOTE — Progress Notes (Signed)
    Procedures performed today:    None.  Independent interpretation of notes and tests performed by another provider:   None.  Brief History, Exam, Impression, and Recommendations:    BP 107/70 (BP Location: Left Arm, Patient Position: Sitting, Cuff Size: Normal)   Pulse 60   Ht 6' (1.829 m)   Wt 224 lb 8 oz (101.8 kg)   SpO2 96%   BMI 30.45 kg/m   Type 2 diabetes mellitus with hyperglycemia, without long-term current use of insulin  [facility]) Assessment & Plan: Patient continues with Jardiance  and metformin .  As discussed separately, he has been having some epigastric symptoms, however feel that this is more related to the diclofenac  and less likely related to the metformin .  Prior A1c was slightly above goal, due for recheck today.  Denies any issues with symptoms suggesting notable hyperglycemia or episodes of hypoglycemia. A1c in office today does show improvement and is now at goal at 6.7%.  Given this, can continue with current medication regimen, no changes made today We will plan to follow-up in about 3 to 4 months to monitor progress or sooner as needed  Orders: -     POCT glycosylated hemoglobin (Hb A1C)  Pain of foot, unspecified laterality Assessment & Plan: Continues to have foot pain.  Has had prior evaluation with podiatry.  He was started on diclofenac  orally by podiatry and continues with this medication.  He indicates that he has been taking this regularly twice a day.  He will note symptoms if he does miss a dose.  He feels that it has been helpful in regards to controlling his pain.  Unfortunately, he is also noted some stomach upset/epigastric discomfort. Reviewed medication considerations with patient.  Discussed that regular use of anti-inflammatory can put patient at risk related to side effects and potential complications from regular NSAID use.  This can include stomach upset, issues affecting lining of stomach.  Given this, would recommend generally trying to  limit use of oral NSAID.  If symptoms not appropriately controlled with reduction in use of NSAID, recommend having follow-up with podiatrist in order to review considerations related to management of pain.  He indicates that he will contact podiatrist in order to schedule follow-up.  If any worsening is noted with epigastric discomfort or new GI symptoms noted, recommend returning to the office for further evaluation   Return in about 3 months (around 06/18/2024) for diabetes.   ___________________________________________ Sheryn Aldaz de [place], MD, ABFM, CAQSM Primary Care and Sports Medicine [facility]

## 2024-03-18 NOTE — Patient Instructions (Signed)

## 2024-04-06 ENCOUNTER — Other Ambulatory Visit: Payer: Self-pay | Admitting: Podiatry

## 2024-04-24 ENCOUNTER — Other Ambulatory Visit: Payer: Self-pay | Admitting: Cardiovascular Disease

## 2024-06-23 ENCOUNTER — Ambulatory Visit (HOSPITAL_BASED_OUTPATIENT_CLINIC_OR_DEPARTMENT_OTHER): Admitting: Family Medicine

## 2024-06-23 ENCOUNTER — Encounter (HOSPITAL_BASED_OUTPATIENT_CLINIC_OR_DEPARTMENT_OTHER): Payer: Self-pay | Admitting: Family Medicine

## 2024-06-23 VITALS — BP 107/71 | HR 69 | Temp 97.7°F | Resp 18 | Ht 72.0 in | Wt 226.0 lb

## 2024-06-23 DIAGNOSIS — E1165 Type 2 diabetes mellitus with hyperglycemia: Secondary | ICD-10-CM

## 2024-06-23 DIAGNOSIS — Z7984 Long term (current) use of oral hypoglycemic drugs: Secondary | ICD-10-CM

## 2024-06-23 DIAGNOSIS — Z0000 Encounter for general adult medical examination without abnormal findings: Secondary | ICD-10-CM

## 2024-06-23 LAB — POCT GLYCOSYLATED HEMOGLOBIN (HGB A1C): Hemoglobin A1C: 6.4 % — AB (ref 4.0–5.6)

## 2024-06-23 NOTE — Assessment & Plan Note (Signed)
 Patient continues with Jardiance  and metformin .  Prior A1c was at goal, due for recheck today.  Denies any issues with symptoms suggesting notable hyperglycemia or episodes of hypoglycemia. Hemoglobin A1c today continues to be well-controlled at 6.4%.  Can continue with current medication regimen, no changes made today.  He does have eye specialist that he follows with, Dr. Elma. Discussed having records sent over

## 2024-06-23 NOTE — Progress Notes (Signed)
    Procedures performed today:    None.  Independent interpretation of notes and tests performed by another provider:   None.  Brief History, Exam, Impression, and Recommendations:    BP 107/71   Pulse 69   Temp 97.7 F (36.5 C) (Oral)   Resp 18   Ht 6' (1.829 m)   Wt 226 lb (102.5 kg)   SpO2 96%   BMI 30.65 kg/m   Type 2 diabetes mellitus with hyperglycemia, without long-term current use of insulin  [facility]) Assessment & Plan: Patient continues with Jardiance  and metformin .  Prior A1c was at goal, due for recheck today.  Denies any issues with symptoms suggesting notable hyperglycemia or episodes of hypoglycemia. Hemoglobin A1c today continues to be well-controlled at 6.4%.  Can continue with current medication regimen, no changes made today.  He does have eye specialist that he follows with, Dr. Elma. Discussed having records sent over  Orders: -     POCT glycosylated hemoglobin (Hb A1C) -     Microalbumin / creatinine urine ratio; Future  Wellness examination -     CBC with Differential/Platelet; Future -     Comprehensive metabolic panel with GFR; Future -     Hemoglobin A1c; Future -     Lipid panel; Future -     TSH Rfx on Abnormal to Free T4; Future -     Microalbumin / creatinine urine ratio; Future  Return in about 5 months (around 11/23/2024) for CPE with fasting labs 1 week prior.   ___________________________________________ Ninah Moccio de [place], MD, ABFM, CAQSM Primary Care and Sports Medicine [facility]

## 2024-08-11 ENCOUNTER — Ambulatory Visit: Admitting: Podiatry

## 2024-08-11 ENCOUNTER — Encounter: Payer: Self-pay | Admitting: Podiatry

## 2024-08-11 ENCOUNTER — Ambulatory Visit

## 2024-08-11 DIAGNOSIS — M205X1 Other deformities of toe(s) (acquired), right foot: Secondary | ICD-10-CM | POA: Diagnosis not present

## 2024-08-11 DIAGNOSIS — M722 Plantar fascial fibromatosis: Secondary | ICD-10-CM | POA: Diagnosis not present

## 2024-08-12 ENCOUNTER — Telehealth: Payer: Self-pay | Admitting: Podiatry

## 2024-08-12 NOTE — Telephone Encounter (Signed)
 Received surgical consent form.   Left message for pt to call to get the surgery scheduled. PEr Dr Magdalen 10/7 or 10/14

## 2024-08-12 NOTE — Progress Notes (Signed)
 Subjective:   Patient ID: James Francis, male   DOB: 53 y.o.   MRN: [id number]   HPI Patient presents stating that he is having a lot of pain in his big toe joint right and he is ready to get this corrected in the next few weeks.  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  He would like to get this done and after extensive review signed consent form understands no guarantee as to the condition of the joint and eventually it may need fusion or joint implantation.  We will do a biplanar osteotomy he understands the total recovery will be 6 months approximately and today I did go ahead and I dispensed air fracture walker properly fitted to the lower leg and I like him to wear this prior to surgery and get used to it and fit shoe on the other foot properly.  X-rays indicate elongated elevated first metatarsal segment right but joint itself looks relatively healthy

## 2024-08-15 ENCOUNTER — Telehealth: Payer: Self-pay | Admitting: Podiatry

## 2024-08-15 NOTE — Telephone Encounter (Signed)
 Pt left message late yesterday( 754pm) stating returning call stating that 10/14 would be best for the surgery.  I called pt and left message I will tentatively have him scheduled for 10/14 but need him to call to confirm.

## 2024-08-19 ENCOUNTER — Encounter: Payer: Self-pay | Admitting: Podiatry

## 2024-08-19 ENCOUNTER — Telehealth: Payer: Self-pay | Admitting: Podiatry

## 2024-08-19 NOTE — Telephone Encounter (Signed)
 Pt left message yesterday afternoon at 417pm returning call from Friday to get scheduled for surgery. When called the options were 10/7 or 10/14.pt stated next Tuesday.   Left message for pt to call to confirm

## 2024-08-19 NOTE — Telephone Encounter (Signed)
 Called pt and it went to voicemail but I did not leave message.  Pt called back and is scheduled for 10/14. Is on asprin and jardiance  and is aware that has to be stopped 1 wk prior to surgery.Pharmacy is correct in chart.

## 2024-08-20 ENCOUNTER — Telehealth: Payer: Self-pay | Admitting: Podiatry

## 2024-08-20 NOTE — Telephone Encounter (Signed)
 Received voicemail from patient asking if he needed to initiate a prior auth. I called and left message for patient informing him that Dav[facility] would handle that and he should be all set to go as of now. If anything were to change we would reach out to him.

## 2024-08-25 ENCOUNTER — Telehealth: Payer: Self-pay | Admitting: Podiatry

## 2024-08-25 MED ORDER — OXYCODONE-ACETAMINOPHEN 10-325 MG PO TABS: 1.0000 | ORAL_TABLET | ORAL | 0 refills | Status: DC | PRN

## 2024-08-25 MED ORDER — ONDANSETRON HCL 4 MG PO TABS: 4.0000 mg | ORAL_TABLET | Freq: Three times a day (TID) | ORAL | 0 refills | Status: DC | PRN

## 2024-08-25 NOTE — Telephone Encounter (Signed)
 DOS- 08/26/2024  [place] BUNIONECTOMY RT- 28296 HEEL INJECTION LT- 20550  [employer] EFFECTIVE DATE- 11/14/2023  DEDUCTIBLE- $600 REMAINING- $0 OOP- $3000 REMAINING- $2114.42 FAMILY DEDUCTIBLE- $1300 REMAINING- $700 FAMILY OOP- $6000 REMAINING- $5114.42  PER [employer] PORTAL, PRIOR AUTH FOR CPT CODES 71703 AND 20550 HAVE BEEN APPROVED FROM 08/26/2024-11/24/2024. AUTH# [id number]

## 2024-08-25 NOTE — Addendum Note (Signed)
 Addended by: MAGDALEN PASCO RAMAN on: 08/25/2024 05:20 PM   Modules accepted: Orders

## 2024-08-26 DIAGNOSIS — M722 Plantar fascial fibromatosis: Secondary | ICD-10-CM | POA: Diagnosis not present

## 2024-08-26 DIAGNOSIS — M2011 Hallux valgus (acquired), right foot: Secondary | ICD-10-CM | POA: Diagnosis not present

## 2024-09-01 ENCOUNTER — Ambulatory Visit

## 2024-09-01 ENCOUNTER — Ambulatory Visit: Admitting: Podiatry

## 2024-09-01 VITALS — BP 103/65 | HR 62 | Temp 97.3°F

## 2024-09-01 DIAGNOSIS — M205X1 Other deformities of toe(s) (acquired), right foot: Secondary | ICD-10-CM

## 2024-09-01 NOTE — Patient Instructions (Signed)

## 2024-09-01 NOTE — Progress Notes (Signed)
 Patient presents for post-op visit today, POV # 1 DOS 08/26/24 -RT 1ST METATARSAL SHORTENING OSTEOTOMY W/ FIXATION, LEFT HEEL INJECTION  I think I've been doing okay since surgery. Has had some pain, but not a lot.  Can he go get the shingles, COVID, Flu vaccine? He is also due for a colonoscopy and would like to know if there are any restrictions with that? When does he go back to work?.   Vital Signs: Today's Vitals   09/01/24 0915  BP: 103/65  Pulse: 62  Temp: (!) 97.3 F (36.3 C)  TempSrc: Oral  PainSc: 0-No pain    Radiographs: [x]  Taken []  Not taken  Surgical Site Assessment:  - Dressing:  [x]  Minimal dry blood, intact []  Reinforced   [x]  Changed     -RN Notes: n/a  - Incision:  [x]  CDI (clean, dry, intact)  [x]  Mild erythema  []  Drainage noted   -RN Notes: n/a  - Swelling:  []  None  [x]  Mild  []  Moderate   []  Significant    - Bruising:  []  None  [x]  Present: base of toes, medial aspect of foot, top of foot.   - Sutures/staples:  [x]  Intact  []  Removed Today  []  Plan to remove at next visit     -RN Notes: Absorbable sutures intact.   -Cast/Splint/Pins: [x]  None []  Intact  []  Removed []  Plan to remove at next visit []  Replaced  -Signs of infection:  [x]  None  []  Present - Describe: n/a  -DME:    [x]  AFW []  Surgical shoe []  Cast  []  Splint  -Walking status:  [x]  Full WB  []  Partial WB  []  NWB  -Utilizing device:  [x]  None []  Knee Scooter []  Crutches []  Wheelchair    DVT assessment:  [x]  Denies symptoms []  Chest pain/SOB []  Pain in calf/redness/warmth   Redressed DSD and ace wrap. Educated on signs of infection, proper dressing care, pain management, and weight bearing status. Patient will contact provider with any new or worsening symptoms. The provider assessed the patient today and reviewed instructions regarding plan of care.

## 2024-09-01 NOTE — Progress Notes (Signed)
 Subjective:   Patient ID: James Francis, male   DOB: 53 y.o.   MRN: [id number]   HPI Patient presents stating doing well with right foot seen by nurse today   ROS      Objective:  Physical Exam  Neurovascular status intact negative [place]' sign noted wound edges healed well good alignment     Assessment:  Doing well surgically     Plan:  Reviewed motion reviewed continue immobilization and compression applied and reappoint 3 weeks or earlier if needed  X-rays indicate osteotomy looks good position looks good joint does look healthy

## 2024-09-03 ENCOUNTER — Encounter: Payer: Self-pay | Admitting: Podiatry

## 2024-09-04 ENCOUNTER — Telehealth: Payer: Self-pay | Admitting: Lab

## 2024-09-04 NOTE — Telephone Encounter (Signed)
 Sent pt a mess via MyChart for specifics of note for work.

## 2024-09-04 NOTE — Telephone Encounter (Signed)
 Patient is in need of work note for work since he is in a surgical shoe please advise.

## 2024-09-05 ENCOUNTER — Encounter: Payer: Self-pay | Admitting: Podiatry

## 2024-09-05 NOTE — Telephone Encounter (Signed)
 Faxed note to [employer] to adv pt needs to wear boot/shoe until next office visit. I sent mess to him to confirm his email and I will send him copy of what was sent to his job.

## 2024-09-05 NOTE — Telephone Encounter (Signed)
 That is fine

## 2024-09-15 ENCOUNTER — Encounter

## 2024-09-22 ENCOUNTER — Ambulatory Visit (INDEPENDENT_AMBULATORY_CARE_PROVIDER_SITE_OTHER)

## 2024-09-22 ENCOUNTER — Ambulatory Visit: Admitting: Podiatry

## 2024-09-22 ENCOUNTER — Encounter: Payer: Self-pay | Admitting: Podiatry

## 2024-09-22 DIAGNOSIS — M205X1 Other deformities of toe(s) (acquired), right foot: Secondary | ICD-10-CM | POA: Diagnosis not present

## 2024-09-22 NOTE — Progress Notes (Unsigned)
 Patient presents for post-op visit today, POV # 2 DOS 08/26/24 -RT 1ST METATARSAL SHORTENING OSTEOTOMY W/ FIXATION, LEFT HEEL INJECTION  Feels better, doing pretty well. Not really any problems..  RN Notes: n/a  Vital Signs: Today's Vitals   09/22/24 1125  PainSc: 0-No pain      Radiographs: [x]  Taken []  Not taken  Surgical Site Assessment:  - Dressing:  [x]  Minimal dry blood, intact []  Reinforced   []  Changed     -RN Notes: n/a  - Incision:  [x]  CDI (clean, dry, intact)  [x]  Mild erythema  []  Drainage noted   -RN Notes: n/a  - Swelling:  []  None  [x]  Mild  []  Moderate   []  Significant     -RN Notes: n/a  - Bruising:  []  None  [x]  Present: 1st hallux   - Sutures/Staples:  []  None []  Intact  []  Removed Today  []  Plan to remove at next visit     -RN Notes: absorbable sutures in place. Ends cut off.   -Cast/Splint/Pins: [x]  None []  Intact []  Removed Today []  Plan to remove at next visit []  Replaced  -Signs of infection:  [x]  None  []  Present - Describe: n/a  -DME:    []  None []  AFW [x]  Surgical shoe []  Cast  []  Splint  -Walking status:  [x]  Full WB  []  Partial WB  []  NWB  -Utilizing device:  [x]  None []  Knee Scooter []  Crutches []  Wheelchair    DVT assessment:  [x]  Denies symptoms []  Chest pain/SOB []  Pain in calf/redness/warmth   Redressed DSD and ace wrap. Educated on signs of infection, proper dressing care, pain management, and weight bearing status. Patient will contact provider with any new or worsening symptoms. The provider assessed the patient today and reviewed instructions regarding plan of care.

## 2024-09-24 NOTE — Progress Notes (Signed)
 Subjective:   Patient ID: James Francis, male   DOB: 53 y.o.   MRN: [id number]   HPI Patient seen by nurse and I checked x-ray and check to patient today.   ROS      Objective:  Physical Exam  Incision sites look good range of motion first MPJ adequate with no crepitus of the joint noted with the patient still in surgical shoe     Assessment:  Doing well post osteotomy right     Plan:  X-ray indicates good alignment osteotomy healing well fixation in place and encouraged range of motion and gradual return to soft shoe gear at this time with ankle compression stocking dispensed for swelling.  Reappoint 4 weeks

## 2024-09-25 ENCOUNTER — Encounter: Payer: Self-pay | Admitting: Podiatry

## 2024-09-25 NOTE — Telephone Encounter (Signed)
 Per MyChart pt req back to work note with no restrictions faxed to [facility] [phone]. I faxed and emailed pt copy for his records

## 2024-10-12 ENCOUNTER — Other Ambulatory Visit: Payer: Self-pay | Admitting: Cardiovascular Disease

## 2024-10-16 ENCOUNTER — Encounter: Payer: Self-pay | Admitting: Cardiovascular Disease

## 2024-10-16 MED ORDER — ENTRESTO 97-103 MG PO TABS: 1.0000 | ORAL_TABLET | Freq: Two times a day (BID) | ORAL | 0 refills | Status: AC

## 2024-10-20 ENCOUNTER — Ambulatory Visit (INDEPENDENT_AMBULATORY_CARE_PROVIDER_SITE_OTHER): Admitting: Podiatry

## 2024-10-20 ENCOUNTER — Ambulatory Visit (INDEPENDENT_AMBULATORY_CARE_PROVIDER_SITE_OTHER)

## 2024-10-20 ENCOUNTER — Encounter: Payer: Self-pay | Admitting: Podiatry

## 2024-10-20 DIAGNOSIS — M205X1 Other deformities of toe(s) (acquired), right foot: Secondary | ICD-10-CM

## 2024-10-20 NOTE — Progress Notes (Signed)
 Subjective:   Patient ID: James Francis, male   DOB: 53 y.o.   MRN: [id number]   HPI Patient states seems to be doing very well with surgery back to work full-time and wearing normal shoes   ROS      Objective:  Physical Exam  Neurovascular status intact negative T[place]a' sign noted wound edges coapted well still has some restriction of motion around the first MPJ but it is improving nonpainful no crepitus     Assessment:  Doing very well post hallux limitus repair right     Plan:  H&P reviewed and at this point discharge patient instructed on return to normal activity encouraged walking and motion exercises and will be seen back as needed  X-rays indicate osteotomy is healing well fixation in place joint congruence

## 2024-10-26 ENCOUNTER — Other Ambulatory Visit: Payer: Self-pay | Admitting: Cardiovascular Disease

## 2024-11-19 ENCOUNTER — Other Ambulatory Visit (HOSPITAL_BASED_OUTPATIENT_CLINIC_OR_DEPARTMENT_OTHER): Payer: Self-pay | Admitting: Family Medicine

## 2024-11-19 DIAGNOSIS — Z0000 Encounter for general adult medical examination without abnormal findings: Secondary | ICD-10-CM

## 2024-11-19 DIAGNOSIS — E1165 Type 2 diabetes mellitus with hyperglycemia: Secondary | ICD-10-CM

## 2024-11-20 ENCOUNTER — Encounter: Payer: Self-pay | Admitting: Cardiovascular Disease

## 2024-11-20 LAB — MICROALBUMIN / CREATININE URINE RATIO
Creatinine, Urine: 25.5 mg/dL
Microalb/Creat Ratio: <12 mg/g{creat} (ref 0–29)
Microalbumin, Urine: <3 ug/mL

## 2024-11-20 LAB — COMPREHENSIVE METABOLIC PANEL WITH GFR
ALT: 66 IU/L — ABNORMAL HIGH (ref 0–44)
AST: 39 IU/L (ref 0–40)
Albumin: 4.8 g/dL (ref 3.8–4.9)
Alkaline Phosphatase: 49 IU/L (ref 47–123)
BUN/Creatinine Ratio: 22 — ABNORMAL HIGH (ref 9–20)
BUN: 21 mg/dL (ref 6–24)
Bilirubin Total: 0.8 mg/dL (ref 0.0–1.2)
CO2: 21 mmol/L (ref 20–29)
Calcium: 9.5 mg/dL (ref 8.7–10.2)
Chloride: 102 mmol/L (ref 96–106)
Creatinine, Ser: 0.94 mg/dL (ref 0.76–1.27)
Globulin, Total: 2.1 g/dL (ref 1.5–4.5)
Glucose: 120 mg/dL — ABNORMAL HIGH (ref 70–99)
Potassium: 5 mmol/L (ref 3.5–5.2)
Sodium: 139 mmol/L (ref 134–144)
Total Protein: 6.9 g/dL (ref 6.0–8.5)
eGFR: 97 mL/min/1.73

## 2024-11-20 LAB — CBC WITH DIFFERENTIAL/PLATELET
Basophils Absolute: 0.1 x10E3/uL (ref 0.0–0.2)
Basos: 1 %
EOS (ABSOLUTE): 0.3 x10E3/uL (ref 0.0–0.4)
Eos: 5 %
Hematocrit: 47.7 % (ref 37.5–51.0)
Hemoglobin: 15.5 g/dL (ref 13.0–17.7)
Immature Grans (Abs): 0 x10E3/uL (ref 0.0–0.1)
Immature Granulocytes: 0 %
Lymphocytes Absolute: 2.9 x10E3/uL (ref 0.7–3.1)
Lymphs: 41 %
MCH: 27.9 pg (ref 26.6–33.0)
MCHC: 32.5 g/dL (ref 31.5–35.7)
MCV: 86 fL (ref 79–97)
Monocytes Absolute: 0.4 x10E3/uL (ref 0.1–0.9)
Monocytes: 6 %
Neutrophils Absolute: 3.3 x10E3/uL (ref 1.4–7.0)
Neutrophils: 47 %
Platelets: 240 x10E3/uL (ref 150–450)
RBC: 5.56 x10E6/uL (ref 4.14–5.80)
RDW: 14.3 % (ref 11.6–15.4)
WBC: 7 x10E3/uL (ref 3.4–10.8)

## 2024-11-20 LAB — TSH RFX ON ABNORMAL TO FREE T4: TSH: 0.778 u[IU]/mL (ref 0.450–4.500)

## 2024-11-20 LAB — LIPID PANEL
Chol/HDL Ratio: 2.8 ratio (ref 0.0–5.0)
Cholesterol, Total: 113 mg/dL (ref 100–199)
HDL: 41 mg/dL
LDL Chol Calc (NIH): 43 mg/dL (ref 0–99)
Triglycerides: 174 mg/dL — ABNORMAL HIGH (ref 0–149)
VLDL Cholesterol Cal: 29 mg/dL (ref 5–40)

## 2024-11-20 LAB — HEMOGLOBIN A1C
Est. average glucose Bld gHb Est-mCnc: 174 mg/dL
Hgb A1c MFr Bld: 7.7 % — ABNORMAL HIGH (ref 4.8–5.6)

## 2024-11-26 ENCOUNTER — Encounter (HOSPITAL_BASED_OUTPATIENT_CLINIC_OR_DEPARTMENT_OTHER): Payer: Self-pay | Admitting: Family Medicine

## 2024-11-26 ENCOUNTER — Ambulatory Visit (INDEPENDENT_AMBULATORY_CARE_PROVIDER_SITE_OTHER): Admitting: Family Medicine

## 2024-11-26 ENCOUNTER — Ambulatory Visit: Admitting: *Deleted

## 2024-11-26 VITALS — Ht 72.0 in | Wt 230.0 lb

## 2024-11-26 VITALS — BP 96/67 | HR 60 | Temp 97.7°F | Resp 18 | Ht 72.0 in | Wt 230.0 lb

## 2024-11-26 DIAGNOSIS — E1165 Type 2 diabetes mellitus with hyperglycemia: Secondary | ICD-10-CM

## 2024-11-26 DIAGNOSIS — N529 Male erectile dysfunction, unspecified: Secondary | ICD-10-CM | POA: Diagnosis not present

## 2024-11-26 DIAGNOSIS — Z23 Encounter for immunization: Secondary | ICD-10-CM

## 2024-11-26 DIAGNOSIS — Z7984 Long term (current) use of oral hypoglycemic drugs: Secondary | ICD-10-CM

## 2024-11-26 DIAGNOSIS — Z0000 Encounter for general adult medical examination without abnormal findings: Secondary | ICD-10-CM | POA: Insufficient documentation

## 2024-11-26 DIAGNOSIS — Z1211 Encounter for screening for malignant neoplasm of colon: Secondary | ICD-10-CM

## 2024-11-26 MED ORDER — NA SULFATE-K SULFATE-MG SULF 17.5-3.13-1.6 GM/177ML PO SOLN: 1.0000 | Freq: Once | ORAL | 0 refills | Status: AC

## 2024-11-26 MED ORDER — TIRZEPATIDE 2.5 MG/0.5ML ~~LOC~~ SOAJ: 2.5000 mg | SUBCUTANEOUS | 2 refills | Status: AC

## 2024-11-26 MED ORDER — SILDENAFIL CITRATE 100 MG PO TABS: 100.0000 mg | ORAL_TABLET | Freq: Every day | ORAL | 1 refills | Status: AC | PRN

## 2024-11-26 NOTE — Assessment & Plan Note (Signed)
 Patient continues with Jardiance  and metformin .  Most recent A1c has increased and is now at 7.7%, is slightly above goal at this point.  Denies any issues with symptoms suggesting notable hyperglycemia or episodes of hypoglycemia. We discussed considerations and patient does have interest in starting GLP-1 medication.  We would like to start with Mounjaro .  Cautioned on potential side effects.  Advised that if he is doing well with first month of medication, he can send us  a message and we can send prescription for dosage increase. He does have eye specialist that he follows with, Dr. Elma. Will plan to follow-up in about 3 months to assess progress with medication and recheck A1c Up-to-date with urine ACR He does follow with podiatry in regards to foot exam

## 2024-11-26 NOTE — Assessment & Plan Note (Signed)
 Has not had significant benefit with 50 mg dose of sildenafil .  We discussed considerations, will increase dose to 100 mg and monitor response.  Cautioned on side effects. If continuing to have ongoing issues with ED, can also consider referral to urologist

## 2024-11-26 NOTE — Progress Notes (Signed)
 " Subjective:    CC: Annual Physical Exam  HPI: James Francis is a 54 y.o. presenting for annual physical  I reviewed the past medical history, family history, social history, surgical history, and allergies today and no changes were needed.  Please see the problem list section below in epic for further details.  Past Medical History: Past Medical History:  Diagnosis Date   CHF (congestive heart failure) (HCC)    Coronary artery disease    Diabetes mellitus without complication (HCC)    Hearing loss    High frequency bilateral   History of broken nose    Multiple- has deviated septum   History of colon polyps    Hyperlipidemia    Hypertension    Ruptured lumbar disc    L4-L5 as a child   Past Surgical History: Past Surgical History:  Procedure Laterality Date   CARDIAC CATHETERIZATION     COLONOSCOPY     CORONARY STENT INTERVENTION N/A 12/14/2020   Procedure: CORONARY STENT INTERVENTION;  Surgeon: Dann Candyce RAMAN, MD;  Location: [facility] INVASIVE CV LAB;  Service: Cardiovascular;  Laterality: N/A;   CORONARY ULTRASOUND/IVUS N/A 12/14/2020   Procedure: Intravascular Ultrasound/IVUS;  Surgeon: Dann Candyce RAMAN, MD;  Location: [facility] INVASIVE CV LAB;  Service: Cardiovascular;  Laterality: N/A;   LEFT HEART CATH AND CORONARY ANGIOGRAPHY N/A 12/14/2020   Procedure: LEFT HEART CATH AND CORONARY ANGIOGRAPHY;  Surgeon: Dann Candyce RAMAN, MD;  Location: [facility] INVASIVE CV LAB;  Service: Cardiovascular;  Laterality: N/A;   NO PAST SURGERIES     Social History: Social History   Socioeconomic History   Marital status: Married    Spouse name: Not on file   Number of children: 2   Years of education: 16   Highest education level: Bachelor's degree (e.g., BA, AB, BS)  Occupational History   Occupation: Art Gallery Manager  Tobacco Use   Smoking status: Never    Passive exposure: Never   Smokeless tobacco: Never  Vaping Use   Vaping status: Never Used  Substance and Sexual Activity   Alcohol use:  Yes    Alcohol/week: 0.0 standard drinks of alcohol    Comment: occasionally    Drug use: No   Sexual activity: Yes  Other Topics Concern   Not on file  Social History Narrative   Not on file   Social Drivers of Health   Tobacco Use: Low Risk (11/26/2024)   Patient History    Smoking Tobacco Use: Never    Smokeless Tobacco Use: Never    Passive Exposure: Never  Financial Resource Strain: Not on file  Food Insecurity: Not on file  Transportation Needs: Not on file  Physical Activity: Not on file  Stress: Not on file  Social Connections: Not on file  Depression (PHQ2-9): Low Risk (12/20/2023)   Depression (PHQ2-9)    PHQ-2 Score: 0  Alcohol Screen: Not on file  Housing: Not on file  Utilities: Not on file  Health Literacy: Not on file   Family History: Family History  Problem Relation Age of Onset   Cancer Mother        unknown type   Kidney Stones Mother    Heart disease Father        MI age 62, triple Bypass/ death age 59   Cancer Maternal Grandmother        Ovarian   Heart disease Paternal Grandfather        MI   Diabetes Paternal Grandfather    Crohn's disease Paternal Aunt  Colon cancer Neg Hx    Stomach cancer Neg Hx    Rectal cancer Neg Hx    Esophageal cancer Neg Hx    Liver cancer Neg Hx    Allergies: Allergies[1] Medications: See med rec.  Review of Systems: No headache, visual changes, nausea, vomiting, diarrhea, constipation, dizziness, abdominal pain, skin rash, fevers, chills, night sweats, swollen lymph nodes, weight loss, chest pain, body aches, joint swelling, muscle aches, shortness of breath, mood changes, visual or auditory hallucinations.  Objective:    BP 96/67 (BP Location: Left Arm, Patient Position: Sitting, Cuff Size: Normal)   Pulse 60   Temp 97.7 F (36.5 C) (Oral)   Resp 18   Ht 6' (1.829 m)   Wt 230 lb (104.3 kg)   SpO2 100%   BMI 31.19 kg/m   General: Well Developed, well nourished, and in no acute distress. Neuro:  Alert and oriented x3, extra-ocular muscles intact, sensation grossly intact. Cranial nerves II through XII are intact, motor, sensory, and coordinative functions are all intact. HEENT: Normocephalic, atraumatic, pupils equal round reactive to light, neck supple, no masses, no lymphadenopathy, thyroid nonpalpable. Oropharynx, nasopharynx, external ear canals are unremarkable. Skin: Warm and dry, no rashes noted. Cardiac: Regular rate and rhythm, no murmurs rubs or gallops. Respiratory: Clear to auscultation bilaterally. Not using accessory muscles, speaking in full sentences. Abdominal: Soft, nontender, nondistended, positive bowel sounds, no masses, no organomegaly. Musculoskeletal: Shoulder, elbow, wrist, hip, knee, ankle stable, and with full range of motion.  Impression and Recommendations:    Wellness examination Assessment & Plan: Routine HCM labs ordered. HCM reviewed/discussed. Anticipatory guidance regarding healthy weight, lifestyle and choices given. Recommend healthy diet.  Recommend approximately 150 minutes/week of moderate intensity exercise Recommend regular dental and vision exams Always use seatbelt/lap and shoulder restraints Recommend using smoke alarms and checking batteries at least twice a year Recommend using sunscreen when outside Discussed colon cancer screening recommendations, options.  Scheduled for later this month Discussed recommendations for shingles vaccine.  Patient UTD Discussed immunization recommendations. PCV 21 today   Erectile dysfunction, unspecified erectile dysfunction type Assessment & Plan: Has not had significant benefit with 50 mg dose of sildenafil .  We discussed considerations, will increase dose to 100 mg and monitor response.  Cautioned on side effects. If continuing to have ongoing issues with ED, can also consider referral to urologist   Type 2 diabetes mellitus with hyperglycemia, without long-term current use of insulin   [facility]) Assessment & Plan: Patient continues with Jardiance  and metformin .  Most recent A1c has increased and is now at 7.7%, is slightly above goal at this point.  Denies any issues with symptoms suggesting notable hyperglycemia or episodes of hypoglycemia. We discussed considerations and patient does have interest in starting GLP-1 medication.  We would like to start with Mounjaro .  Cautioned on potential side effects.  Advised that if he is doing well with first month of medication, he can send us  a message and we can send prescription for dosage increase. He does have eye specialist that he follows with, Dr. Elma. Will plan to follow-up in about 3 months to assess progress with medication and recheck A1c Up-to-date with urine ACR He does follow with podiatry in regards to foot exam   Immunization due -     Pneumococcal Conjugate PCV21(Capvaxive)  Other orders -     Tirzepatide ; Inject 2.5 mg into the skin once a week.  Dispense: 2 mL; Refill: 2 -     Sildenafil  Citrate; Take 1  tablet (100 mg total) by mouth daily as needed for erectile dysfunction.  Dispense: 10 tablet; Refill: 1  Return in about 3 months (around 02/24/2025) for diabetes.   ___________________________________________ Lelend Heine[place] [name], MD, ABFM, CAQSM Primary Care and Sports Medicine [facility]    [1] No Known Allergies  "

## 2024-11-26 NOTE — Progress Notes (Signed)
 Pt's name and DOB verified at the beginning of the pre-visit with 2 identifiers   Pt denies any difficulty with ambulating,sitting, laying down or rolling side to side  Pt has no issues moving head neck or swallowing  No egg or soy allergy known to patient   No issues known to pt with past sedation  No FH of Malignant Hyperthermia  Pt is not on home 02   Pt is not on blood thinners   Pt denies issues with constipation   Pt is not on dialysis  Pt denise any abnormal heart rhythms   Pt denies any upcoming cardiac testing  Patient's chart reviewed by Norleen Schillings CNRA prior to pre-visit and patient appropriate for the [facility].  Pre-visit completed and red dot placed by patient's name on their procedure day (on provider's schedule).     Pt just started on Mounjaro  and instructed pt to hold 7 days prior Visit by phone  Pt states weight is 230 lb  Pt given  both [facility] main # and MD on call # prior to instructions.  Informed pt to come in at the time discussed and is shown on PV instructions.  Pt instructed to use Single[employer] or GoodRx for a price reduction on prep  Instructed pt where to find PV instructions in My Chart. . Instructed pt on all aspects of written instructions including med holds clothing to wear and foods to eat and not eat as well as after procedure legal restrictions and to call MD on call if needed.. Pt states understanding. Instructed pt to review instructions again prior to procedure and call main # given if has any questions or any issues. Pt states they will.

## 2024-11-26 NOTE — Assessment & Plan Note (Signed)
 Routine HCM labs ordered. HCM reviewed/discussed. Anticipatory guidance regarding healthy weight, lifestyle and choices given. Recommend healthy diet.  Recommend approximately 150 minutes/week of moderate intensity exercise Recommend regular dental and vision exams Always use seatbelt/lap and shoulder restraints Recommend using smoke alarms and checking batteries at least twice a year Recommend using sunscreen when outside Discussed colon cancer screening recommendations, options.  Scheduled for later this month Discussed recommendations for shingles vaccine.  Patient UTD Discussed immunization recommendations. PCV 21 today

## 2024-11-27 ENCOUNTER — Other Ambulatory Visit: Payer: Self-pay

## 2024-11-27 ENCOUNTER — Encounter: Payer: Self-pay | Admitting: Cardiovascular Disease

## 2024-11-27 ENCOUNTER — Ambulatory Visit: Attending: Cardiovascular Disease | Admitting: Cardiovascular Disease

## 2024-11-27 VITALS — BP 95/59 | HR 72 | Ht 72.0 in

## 2024-11-27 DIAGNOSIS — I2510 Atherosclerotic heart disease of native coronary artery without angina pectoris: Secondary | ICD-10-CM

## 2024-11-27 DIAGNOSIS — E119 Type 2 diabetes mellitus without complications: Secondary | ICD-10-CM

## 2024-11-27 DIAGNOSIS — E782 Mixed hyperlipidemia: Secondary | ICD-10-CM | POA: Diagnosis not present

## 2024-11-27 MED ORDER — EMPAGLIFLOZIN 10 MG PO TABS: 10.0000 mg | ORAL_TABLET | Freq: Every day | ORAL | 3 refills | Status: AC

## 2024-11-27 NOTE — Progress Notes (Unsigned)
 " Cardiology Office Note:    Date:  12/02/2024   ID:  James Francis, DOB 08/26/1971, MRN [id number]  PCP:  [name], Raymond J, MD   [facility] Providers Cardiologist:  Ozell Fell, MD     Referring MD: [name], [name], MD   Chief Complaint  Patient presents with   Coronary Artery Disease    History of Present Illness:    James Francis is a 54 y.o. male presenting for follow-up of coronary artery disease.  The patient initially presented in 2022 with non-STEMI and underwent PCI in the LAD when he was found to have a total occlusion.  LVEF initially was 35 to 40% but normalized on follow-up imaging with an LVEF of 60 to 65%.  Comorbid medical conditions include hypertension, type 2 diabetes, and mixed hyperlipidemia.  His most recent stress test in 2023 showed normal myocardial perfusion and normal LVEF.  The patient is here alone today. He has been experiencing some resting right shoulder pains and has some concerns about whether they could represent an anginal equivalent. The pains do not occur with brisk walking or aerobic exercise. He has no dyspnea, substernal chest pain, palpitations, or leg swelling.   Current Medications: Active Medications[1]   Allergies:   Patient has no known allergies.   ROS:   Please see the history of present illness.    All other systems reviewed and are negative.  EKGs/Labs/Other Studies Reviewed:    The following studies were reviewed today: Cardiac Studies & Procedures   ______________________________________________________________________________________________ CARDIAC CATHETERIZATION  CARDIAC CATHETERIZATION 12/14/2020  Conclusion  There is moderate to severe left ventricular systolic dysfunction.  LV end diastolic pressure is mildly elevated.  The left ventricular ejection fraction is 25-35% by visual estimate.  There is no aortic valve stenosis.  Dist RCA lesion is 50% stenosed.  2nd Mrg lesion is 50% stenosed.   Mid LAD lesion is 100% stenosed.  A drug-eluting stent was successfully placed using a STENT RESOLUTE ONYX 3.5X30.  Post intervention, there is a 0% residual stenosis.  Pain resolved after PCI.  Sx were intermittent over the prior three days.  Hopefully he will have recovery of LV function with revascularization.  Continue aggressive secondary prevention.  Medical therapy for LV dysfunction.  Check echo in AM.  Findings Coronary Findings Diagnostic  Dominance: Right  Left Anterior Descending Collaterals Dist LAD filled by collaterals from RPDA.  Mid LAD lesion is 100% stenosed.  Left Circumflex There is mild diffuse disease throughout the vessel.  Second Obtuse Marginal Branch 2nd Mrg lesion is 50% stenosed.  Right Coronary Artery There is mild diffuse disease throughout the vessel. Dist RCA lesion is 50% stenosed.  Intervention  Mid LAD lesion Stent CATH LAUNCHER 6FR EBU3.5 guide catheter was inserted. Lesion crossed with guidewire using a WIRE ASAHI PROWATER 180CM. Pre-stent angioplasty was performed using a BALLOON SAPPHIRE 2.5X15. A drug-eluting stent was successfully placed using a STENT RESOLUTE ONYX 3.5X30. Stent strut is well apposed. Post-stent angioplasty was performed using a BALLOON SAPPHIRE [place] 4.0X12. IVUS was done after predilatation and after the stent was postdilated.  Several doses of IC verapamil  and IC NTG were given. Post-Intervention Lesion Assessment The intervention was successful. Pre-interventional TIMI flow is 0. Post-intervention TIMI flow is 3. No complications occurred at this lesion. Ultrasound (IVUS) was performed on the lesion post PCI. Stent well apposed. There is a 0% residual stenosis post intervention.   STRESS TESTS  MYOCARDIAL PERFUSION IMAGING 09/04/2022  Interpretation Summary  The study is normal. The study is low risk.   No ST deviation was noted.   Left ventricular function is normal. Nuclear stress EF: 54 %. The left  ventricular ejection fraction is mildly decreased (45-54%). End diastolic cavity size is normal. End systolic cavity size is normal.   Prior study not available for comparison.  Normal resting and stress perfusion. No ischemia or infarction EF 54% Note patient had frequent PVCls in recovery   ECHOCARDIOGRAM  ECHOCARDIOGRAM COMPLETE 03/28/2021  Narrative ECHOCARDIOGRAM REPORT    Patient Name:   James Francis Date of Exam: 03/28/2021 Medical Rec #:  [id number]    Height:       72.0 in Accession #:    [id number]   Weight:       209.9 lb Date of Birth:  1971/05/06     BSA:          2.175 m Patient Age:    50 years     BP:           108/70 mmHg Patient Gender: M            HR:           60 bpm. Exam Location:  Outpatient  Procedure: 2D Echo, Cardiac Doppler and Color Doppler  Indications:    CHF  History:        Patient has prior history of Echocardiogram examinations, most recent 12/14/2018. CHF, CAD and Previous Myocardial Infarction; Risk Factors:Hypertension, Dyslipidemia and Diabetes.  Sonographer:    Lyle Marc Referring Phys: [phone] CAFFIE HERO SIMMONS  IMPRESSIONS   1. Left ventricular ejection fraction, by estimation, is 60 to 65%. The left ventricle has normal function. The left ventricle has no regional wall motion abnormalities. There is mild concentric left ventricular hypertrophy. Left ventricular diastolic parameters are consistent with Grade I diastolic dysfunction (impaired relaxation). 2. Right ventricular systolic function is normal. The right ventricular size is normal. 3. The mitral valve is normal in structure. Trivial mitral valve regurgitation. No evidence of mitral stenosis. 4. The aortic valve is tricuspid. Aortic valve regurgitation is mild. No aortic stenosis is present. 5. Aortic dilatation noted. There is mild dilatation of the aortic root, measuring 39 mm. 6. The inferior vena cava is normal in size with greater than 50% respiratory variability,  suggesting right atrial pressure of 3 mmHg.  Comparison(s): Compared to prior TTE on 12/14/20, the LVEF has improved to 60-65% and the anteroseptal WMA is no longer present.  FINDINGS Left Ventricle: Left ventricular ejection fraction, by estimation, is 60 to 65%. The left ventricle has normal function. The left ventricle has no regional wall motion abnormalities. The left ventricular internal cavity size was normal in size. There is mild concentric left ventricular hypertrophy. Left ventricular diastolic parameters are consistent with Grade I diastolic dysfunction (impaired relaxation). Normal left ventricular filling pressure.  Right Ventricle: The right ventricular size is normal. No increase in right ventricular wall thickness. Right ventricular systolic function is normal.  Left Atrium: Left atrial size was normal in size.  Right Atrium: Right atrial size was normal in size.  Pericardium: There is no evidence of pericardial effusion.  Mitral Valve: The mitral valve is normal in structure. Trivial mitral valve regurgitation. No evidence of mitral valve stenosis.  Tricuspid Valve: The tricuspid valve is normal in structure. Tricuspid valve regurgitation is trivial.  Aortic Valve: The aortic valve is tricuspid. Aortic valve regurgitation is mild. Aortic regurgitation PHT measures 518 msec. No aortic stenosis is present.  Pulmonic Valve: The pulmonic valve was normal in structure. Pulmonic valve regurgitation is trivial.  Aorta: Aortic dilatation noted. There is mild dilatation of the aortic root, measuring 39 mm.  Venous: The inferior vena cava is normal in size with greater than 50% respiratory variability, suggesting right atrial pressure of 3 mmHg.  IAS/Shunts: No atrial level shunt detected by color flow Doppler.   LEFT VENTRICLE PLAX 2D LVIDd:         4.50 cm      Diastology LVIDs:         2.60 cm      LV e' medial:    4.90 cm/s LV PW:         1.20 cm      LV E/e' medial:   11.8 LV IVS:        1.20 cm      LV e' lateral:   7.51 cm/s LVOT diam:     2.55 cm      LV E/e' lateral: 7.7 LV SV:         100 LV SV Index:   46 LVOT Area:     5.11 cm  LV Volumes (MOD) LV vol d, MOD A4C: 144.0 ml LV vol s, MOD A4C: 66.4 ml LV SV MOD A4C:     144.0 ml  RIGHT VENTRICLE RV Basal diam:  2.60 cm RV S prime:     9.68 cm/s TAPSE (M-mode): 2.3 cm  LEFT ATRIUM             Index       RIGHT ATRIUM           Index LA diam:        3.80 cm 1.75 cm/m  RA Area:     14.90 cm LA Vol (A2C):   42.0 ml 19.31 ml/m RA Volume:   33.20 ml  15.27 ml/m LA Vol (A4C):   50.1 ml 23.04 ml/m LA Biplane Vol: 45.8 ml 21.06 ml/m AORTIC VALVE LVOT Vmax:   97.50 cm/s LVOT Vmean:  67.300 cm/s LVOT VTI:    0.195 m AI PHT:      518 msec  AORTA Ao Root diam: 3.50 cm  MITRAL VALVE MV Area (PHT): 2.83 cm    SHUNTS MV Decel Time: 268 msec    Systemic VTI:  0.20 m MV E velocity: 57.80 cm/s  Systemic Diam: 2.55 cm MV A velocity: 51.80 cm/s MV E/A ratio:  1.12  Powell Sorrow MD Electronically signed by Powell Sorrow MD Signature Date/Time: 03/28/2021/10:59:14 AM    Final          ______________________________________________________________________________________________      EKG:   EKG Interpretation Date/Time:  Thursday November 27 2024 16:21:22 EST Ventricular Rate:  72 PR Interval:  208 QRS Duration:  78 QT Interval:  388 QTC Calculation: 424 R Axis:   0  Text Interpretation: Sinus rhythm with occasional Premature ventricular complexes Septal infarct , age undetermined When compared with ECG of 11-Dec-2023 10:43, Premature ventricular complexes are now Present Septal infarct is now Present Confirmed by Wonda Sharper [phone]) on 11/27/2024 4:52:47 PM    Recent Labs: 11/19/2024: ALT 66; BUN 21; Creatinine, Ser 0.94; Hemoglobin 15.5; Platelets 240; Potassium 5.0; Sodium 139; TSH 0.778  Recent Lipid Panel    Component Value Date/Time   CHOL 113 11/19/2024  0801   TRIG 174 (H) 11/19/2024 0801   HDL 41 11/19/2024 0801   CHOLHDL 2.8 11/19/2024 0801   CHOLHDL 4.1 12/14/2020 0341  VLDL 69 (H) 12/14/2020 0341   LDLCALC 43 11/19/2024 0801   LDLCALC 66 10/04/2020 0826        Physical Exam:    VS:  BP (!) 95/59 (BP Location: Left Arm, Patient Position: Sitting, Cuff Size: Large)   Pulse 72   Ht 6' (1.829 m)   SpO2 96%   BMI 31.19 kg/m     Wt Readings from Last 3 Encounters:  11/26/24 230 lb (104.3 kg)  11/26/24 230 lb (104.3 kg)  06/23/24 226 lb (102.5 kg)     GEN:  Well nourished, well developed in no acute distress HEENT: Normal NECK: No JVD; No carotid bruits LYMPHATICS: No lymphadenopathy CARDIAC: RRR, no murmurs, rubs, gallops RESPIRATORY:  Clear to auscultation without rales, wheezing or rhonchi  ABDOMEN: Soft, non-tender, non-distended MUSCULOSKELETAL:  No edema; No deformity  SKIN: Warm and dry NEUROLOGIC:  Alert and oriented x 3 PSYCHIATRIC:  Normal affect   Assessment & Plan Coronary artery disease involving native coronary artery of native heart without angina pectoris Pt with atypical shoulder pain. He has some concerns about anginal-equivalent symptoms but denies any exercise-related pain. I offered him stress testing to have an objective evaluation for ischemia but he declines. States that he will reach out if symptoms worsen or become associated with exertion.  Diabetes mellitus without complication (HCC) Treated by his PCP - recent A1C 7.7. Mixed hyperlipidemia Treated with atorvastatin  80 mg daily. Labs reviewed: chol 113, HDL 41, LDL 43.             Medication Adjustments/Labs and Tests Ordered: Current medicines are reviewed at length with the patient today.  Concerns regarding medicines are outlined above.  Orders Placed This Encounter  Procedures   EKG 12-Lead   No orders of the defined types were placed in this encounter.   There are no Patient Instructions on file for this visit.    Signed, Ozell Fell, MD  12/02/2024 7:56 PM    [facility]     [1]  Current Meds  Medication Sig   aspirin  81 MG chewable tablet CHEW AND SWALLOW 1 TABLET DAILY   ENTRESTO  97-103 MG Take 1 tablet by mouth 2 (two) times daily.   icosapent  Ethyl (VASCEPA ) 1 g capsule TAKE 2 CAPSULES BY MOUTH TWICE A DAY   metFORMIN  (GLUCOPHAGE ) 500 MG tablet TAKE 2 TABLETS BY MOUTH TWICE  DAILY WITH MEALS   metoprolol  succinate (TOPROL -XL) 25 MG 24 hr tablet TAKE 1 TABLET BY MOUTH ONCE  DAILY   [DISCONTINUED] atorvastatin  (LIPITOR ) 80 MG tablet TAKE 1 TABLET BY MOUTH ONCE  DAILY   [DISCONTINUED] empagliflozin  (JARDIANCE ) 10 MG TABS tablet Take 1 tablet (10 mg total) by mouth daily. TAKE 1 TABLET BY MOUTH EVERY DAY BEFORE BREAKFAST   "

## 2024-11-29 ENCOUNTER — Other Ambulatory Visit: Payer: Self-pay | Admitting: Cardiovascular Disease

## 2024-12-02 ENCOUNTER — Encounter: Payer: Self-pay | Admitting: Gastroenterology

## 2024-12-02 NOTE — Assessment & Plan Note (Signed)
 Treated with atorvastatin  80 mg daily. Labs reviewed: chol 113, HDL 41, LDL 43.

## 2024-12-05 ENCOUNTER — Other Ambulatory Visit (HOSPITAL_COMMUNITY): Payer: Self-pay

## 2024-12-05 ENCOUNTER — Telehealth: Payer: Self-pay | Admitting: Pharmacy Technician

## 2024-12-05 NOTE — Telephone Encounter (Signed)
 Pharmacy Patient Advocate Encounter   Received notification from Physician's Office that prior authorization for entresto  GENERIC only is required/requested.   Insurance verification completed.   The patient is insured through [facility].   Per test claim: Refill too soon. PA is not needed at this time. Medication was filled 10/17/24. Next eligible fill date is 12/26/24.    Insurance will only cover generic

## 2024-12-10 ENCOUNTER — Encounter: Payer: Self-pay | Admitting: Gastroenterology

## 2024-12-10 ENCOUNTER — Ambulatory Visit: Admitting: Gastroenterology

## 2024-12-10 VITALS — BP 102/62 | HR 69 | Temp 98.1°F | Resp 15 | Ht 72.0 in | Wt 230.0 lb

## 2024-12-10 DIAGNOSIS — D124 Benign neoplasm of descending colon: Secondary | ICD-10-CM | POA: Diagnosis not present

## 2024-12-10 DIAGNOSIS — Z1211 Encounter for screening for malignant neoplasm of colon: Secondary | ICD-10-CM | POA: Diagnosis present

## 2024-12-10 DIAGNOSIS — K640 First degree hemorrhoids: Secondary | ICD-10-CM

## 2024-12-10 DIAGNOSIS — K635 Polyp of colon: Secondary | ICD-10-CM | POA: Diagnosis not present

## 2024-12-10 DIAGNOSIS — K648 Other hemorrhoids: Secondary | ICD-10-CM

## 2024-12-10 MED ORDER — SODIUM CHLORIDE 0.9 % IV SOLN: 500.0000 mL | Freq: Once | INTRAVENOUS | Status: DC

## 2024-12-10 NOTE — Progress Notes (Signed)
 Called to room to assist during endoscopic procedure.  Patient ID and intended procedure confirmed with present staff. Received instructions for my participation in the procedure from the performing physician.

## 2024-12-10 NOTE — Patient Instructions (Signed)
 Resume previous diet and medications. Awaiting pathology results. Repeat Colonoscopy date to be determined based on pathology results. Handouts provided on colon polyps and hemorrhoids  YOU HAD AN ENDOSCOPIC PROCEDURE TODAY AT THE [facility]:   Refer to the procedure report that was given to you for any specific questions about what was found during the examination.  If the procedure report does not answer your questions, please call your gastroenterologist to clarify.  If you requested that your care partner not be given the details of your procedure findings, then the procedure report has been included in a sealed envelope for you to review at your convenience later.  YOU SHOULD EXPECT: Some feelings of bloating in the abdomen. Passage of more gas than usual.  Walking can help get rid of the air that was put into your GI tract during the procedure and reduce the bloating. If you had a lower endoscopy (such as a colonoscopy or flexible sigmoidoscopy) you may notice spotting of blood in your stool or on the toilet paper. If you underwent a bowel prep for your procedure, you may not have a normal bowel movement for a few days.  Please Note:  You might notice some irritation and congestion in your nose or some drainage.  This is from the oxygen used during your procedure.  There is no need for concern and it should clear up in a day or so.  SYMPTOMS TO REPORT IMMEDIATELY:  Following lower endoscopy (colonoscopy or flexible sigmoidoscopy):  Excessive amounts of blood in the stool  Significant tenderness or worsening of abdominal pains  Swelling of the abdomen that is new, acute  Fever of 100F or higher  For urgent or emergent issues, a gastroenterologist can be reached at any hour by calling [phone]. Do not use MyChart messaging for urgent concerns.    DIET:  We do recommend a small meal at first, but then you may proceed to your regular diet.  Drink plenty of fluids but you  should avoid alcoholic beverages for 24 hours.  ACTIVITY:  You should plan to take it easy for the rest of today and you should NOT DRIVE or use heavy machinery until tomorrow (because of the sedation medicines used during the test).    FOLLOW UP: Our staff will call the number listed on your records the next business day following your procedure.  We will call around 7:15- 8:00 am to check on you and address any questions or concerns that you may have regarding the information given to you following your procedure. If we do not reach you, we will leave a message.     If any biopsies were taken you will be contacted by phone or by letter within the next 1-3 weeks.  Please call us  at [phone] if you have not heard about the biopsies in 3 weeks.    SIGNATURES/CONFIDENTIALITY: You and/or your care partner have signed paperwork which will be entered into your electronic medical record.  These signatures attest to the fact that that the information above on your After Visit Summary has been reviewed and is understood.  Full responsibility of the confidentiality of this discharge information lies with you and/or your care-partner.

## 2024-12-10 NOTE — Progress Notes (Signed)
 Pt's states no medical or surgical changes since previsit or office visit.

## 2024-12-10 NOTE — Progress Notes (Signed)
 "   GASTROENTEROLOGY PROCEDURE H&P NOTE   Primary Care Physician: [name], [name], MD    Reason for Procedure:  Colon Cancer screening  Plan:    Colonoscopy  Patient is appropriate for endoscopic procedure(s) in the ambulatory ([facility]) setting.  The nature of the procedure, as well as the risks, benefits, and alternatives were carefully and thoroughly reviewed with the patient. Ample time for discussion and questions allowed. The patient understood, was satisfied, and agreed to proceed. I personally addressed all patient questions and concerns.     HPI: James Francis is a 54 y.o. male who presents for colonoscopy for routine Colon Cancer screening.  No active GI symptoms.  No known family history of colon cancer or related malignancy.  Patient is otherwise without complaints or active issues today.  He had a colonoscopy in 2010 in [place]  and that it was normal (no prior endoscopy report available for review).   Past Medical History:  Diagnosis Date   CHF (congestive heart failure) (HCC)    Coronary artery disease    Diabetes mellitus without complication (HCC)    Hearing loss    High frequency bilateral   History of broken nose    Multiple- has deviated septum   History of colon polyps    Hyperlipidemia    Hypertension    Myocardial infarction (HCC)    Ruptured lumbar disc    L4-L5 as a child    Past Surgical History:  Procedure Laterality Date   CARDIAC CATHETERIZATION     COLONOSCOPY     CORONARY STENT INTERVENTION N/A 12/14/2020   Procedure: CORONARY STENT INTERVENTION;  Surgeon: Dann Candyce RAMAN, MD;  Location: [facility] INVASIVE CV LAB;  Service: Cardiovascular;  Laterality: N/A;   CORONARY ULTRASOUND/IVUS N/A 12/14/2020   Procedure: Intravascular Ultrasound/IVUS;  Surgeon: Dann Candyce RAMAN, MD;  Location: [facility] INVASIVE CV LAB;  Service: Cardiovascular;  Laterality: N/A;   LEFT HEART CATH AND CORONARY ANGIOGRAPHY N/A 12/14/2020   Procedure: LEFT HEART CATH AND CORONARY  ANGIOGRAPHY;  Surgeon: Dann Candyce RAMAN, MD;  Location: [facility] INVASIVE CV LAB;  Service: Cardiovascular;  Laterality: N/A;   NO PAST SURGERIES      Prior to Admission medications  Medication Sig Start Date End Date Taking? Authorizing Provider  aspirin  81 MG chewable tablet CHEW AND SWALLOW 1 TABLET DAILY 12/21/23   Wonda Sharper, MD  atorvastatin  (LIPITOR ) 80 MG tablet TAKE 1 TABLET BY MOUTH ONCE  DAILY 12/02/24   Wonda Sharper, MD  diclofenac  (VOLTAREN ) 75 MG EC tablet Take 1 tablet (75 mg total) by mouth 2 (two) times daily. Patient not taking: Reported on 11/26/2024 12/20/23   [name], [name], MD  empagliflozin  (JARDIANCE ) 10 MG TABS tablet Take 1 tablet (10 mg total) by mouth daily. TAKE 1 TABLET BY MOUTH EVERY DAY BEFORE BREAKFAST 11/27/24   Wonda Sharper, MD  ENTRESTO  97-103 MG Take 1 tablet by mouth 2 (two) times daily. 10/16/24   Wonda Sharper, MD  icosapent  Ethyl (VASCEPA ) 1 g capsule TAKE 2 CAPSULES BY MOUTH TWICE A DAY 01/17/24   Wonda Sharper, MD  metFORMIN  (GLUCOPHAGE ) 500 MG tablet TAKE 2 TABLETS BY MOUTH TWICE  DAILY WITH MEALS 03/14/24   [name], [name], MD  metoprolol  succinate (TOPROL -XL) 25 MG 24 hr tablet TAKE 1 TABLET BY MOUTH ONCE  DAILY 12/21/23   Wonda Sharper, MD  Na Sulfate-K Sulfate-Mg Sulfate concentrate (SUPREP) 17.5-3.13-1.6 GM/177ML SOLN Take 1 kit by mouth once. Patient not taking: Reported on 11/27/2024  [provider]  nitroGLYCERIN  (NITROSTAT ) 0.4 MG SL tablet DISSOLVE 1 TABLET UNDER THE  TONGUE EVERY 5 MINUTES AS NEEDED FOR CHEST PAIN. MAX OF 3 TABLETS IN 15 MINUTES. CALL 911 IF PAIN  PERSISTS. Patient not taking: Reported on 11/27/2024 04/25/24   Wonda Sharper, MD  sildenafil  (VIAGRA ) 100 MG tablet Take 1 tablet (100 mg total) by mouth daily as needed for erectile dysfunction. Patient not taking: Reported on 11/27/2024 11/26/24   [name], [name], MD  tirzepatide  (MOUNJARO ) 2.5 MG/0.5ML Pen Inject 2.5 mg into the skin once a week. Patient  not taking: Reported on 11/27/2024 11/26/24   [name], [name], MD    Current Outpatient Medications  Medication Sig Dispense Refill   aspirin  81 MG chewable tablet CHEW AND SWALLOW 1 TABLET DAILY 90 tablet 3   atorvastatin  (LIPITOR ) 80 MG tablet TAKE 1 TABLET BY MOUTH ONCE  DAILY 90 tablet 3   diclofenac  (VOLTAREN ) 75 MG EC tablet Take 1 tablet (75 mg total) by mouth 2 (two) times daily. (Patient not taking: Reported on 11/26/2024) 50 tablet 2   empagliflozin  (JARDIANCE ) 10 MG TABS tablet Take 1 tablet (10 mg total) by mouth daily. TAKE 1 TABLET BY MOUTH EVERY DAY BEFORE BREAKFAST 90 tablet 3   ENTRESTO  97-103 MG Take 1 tablet by mouth 2 (two) times daily. 180 tablet 0   icosapent  Ethyl (VASCEPA ) 1 g capsule TAKE 2 CAPSULES BY MOUTH TWICE A DAY 360 capsule 3   metFORMIN  (GLUCOPHAGE ) 500 MG tablet TAKE 2 TABLETS BY MOUTH TWICE  DAILY WITH MEALS 480 tablet 2   metoprolol  succinate (TOPROL -XL) 25 MG 24 hr tablet TAKE 1 TABLET BY MOUTH ONCE  DAILY 90 tablet 3   Na Sulfate-K Sulfate-Mg Sulfate concentrate (SUPREP) 17.5-3.13-1.6 GM/177ML SOLN Take 1 kit by mouth once. (Patient not taking: Reported on 11/27/2024)     nitroGLYCERIN  (NITROSTAT ) 0.4 MG SL tablet DISSOLVE 1 TABLET UNDER THE  TONGUE EVERY 5 MINUTES AS NEEDED FOR CHEST PAIN. MAX OF 3 TABLETS IN 15 MINUTES. CALL 911 IF PAIN  PERSISTS. (Patient not taking: Reported on 11/27/2024) 75 tablet 1   sildenafil  (VIAGRA ) 100 MG tablet Take 1 tablet (100 mg total) by mouth daily as needed for erectile dysfunction. (Patient not taking: Reported on 11/27/2024) 10 tablet 1   tirzepatide  (MOUNJARO ) 2.5 MG/0.5ML Pen Inject 2.5 mg into the skin once a week. (Patient not taking: Reported on 11/27/2024) 2 mL 2   Current Facility-Administered Medications  Medication Dose Route Frequency Provider Last Rate Last Admin   0.9 %  sodium chloride  infusion  500 mL Intravenous Once Laquita Harlan V, DO        Allergies as of 12/10/2024   (No Known Allergies)     Family History  Problem Relation Age of Onset   Cancer Mother        unknown type   Kidney Stones Mother    Heart disease Father        MI age 64, triple Bypass/ death age 5   Crohn's disease Paternal Aunt    Cancer Maternal Grandmother        Ovarian   Heart disease Paternal Grandfather        MI   Diabetes Paternal Grandfather    Colon cancer Neg Hx    Stomach cancer Neg Hx    Rectal cancer Neg Hx    Esophageal cancer Neg Hx    Liver cancer Neg Hx    Colon polyps Neg Hx  Social History   Socioeconomic History   Marital status: Married    Spouse name: Not on file   Number of children: 2   Years of education: 16   Highest education level: Bachelor's degree (e.g., BA, AB, BS)  Occupational History   Occupation: Art Gallery Manager  Tobacco Use   Smoking status: Never    Passive exposure: Never   Smokeless tobacco: Never  Vaping Use   Vaping status: Never Used  Substance and Sexual Activity   Alcohol use: Yes    Alcohol/week: 0.0 standard drinks of alcohol    Comment: occasionally    Drug use: No   Sexual activity: Yes  Other Topics Concern   Not on file  Social History Narrative   Not on file   Social Drivers of Health   Tobacco Use: Low Risk (11/27/2024)   Patient History    Smoking Tobacco Use: Never    Smokeless Tobacco Use: Never    Passive Exposure: Never  Financial Resource Strain: Not on file  Food Insecurity: Not on file  Transportation Needs: Not on file  Physical Activity: Not on file  Stress: Not on file  Social Connections: Not on file  Intimate Partner Violence: Not on file  Depression (PHQ2-9): Low Risk (12/20/2023)   Depression (PHQ2-9)    PHQ-2 Score: 0  Alcohol Screen: Not on file  Housing: Not on file  Utilities: Not on file  Health Literacy: Not on file    Physical Exam: Vital signs in last 24 hours: @BP  (!) 98/54   Pulse 63   Temp 98.1 F (36.7 C)   Ht 6' (1.829 m)   Wt 230 lb (104.3 kg)   SpO2 97%   BMI 31.19 kg/m  GEN:  NAD EYE: Sclerae anicteric ENT: MMM CV: Non-tachycardic Pulm: CTA b/l GI: Soft, NT/ND NEURO:  Alert & Oriented x 3   Sandor Flatter, DO [facility]   12/10/2024 9:14 AM  "

## 2024-12-10 NOTE — Op Note (Signed)
 [facility] Patient Name: James Francis Procedure Date: 12/10/2024 9:19 AM MRN: [id number] Endoscopist: Sandor Flatter , MD, [id number] Age: 54 Referring MD:  Date of Birth: 12/04/1970 Gender: Male Account #: [id number] Procedure:                Colonoscopy Indications:              Screening for colorectal malignant neoplasm (last                            colonoscopy was more than 10 years ago) Medicines:                Monitored Anesthesia Care Procedure:                Pre-Anesthesia Assessment:                           - Prior to the procedure, a History and Physical                            was performed, and patient medications and                            allergies were reviewed. The patient's tolerance of                            previous anesthesia was also reviewed. The risks                            and benefits of the procedure and the sedation                            options and risks were discussed with the patient.                            All questions were answered, and informed consent                            was obtained. Prior Anticoagulants: The patient has                            taken no anticoagulant or antiplatelet agents. ASA                            Grade Assessment: II - A patient with mild systemic                            disease. After reviewing the risks and benefits,                            the patient was deemed in satisfactory condition to                            undergo the procedure.  After obtaining informed consent, the colonoscope                            was passed under direct vision. Throughout the                            procedure, the patient's blood pressure, pulse, and                            oxygen saturations were monitored continuously. The                            CF [id number] #[id number] was introduced through the anus                            and advanced to the  the terminal ileum. The                            colonoscopy was performed without difficulty. The                            patient tolerated the procedure well. The quality                            of the bowel preparation was good. The terminal                            ileum, ileocecal valve, appendiceal orifice, and                            rectum were photographed. Scope In: 9:33:50 AM Scope Out: 9:50:06 AM Scope Withdrawal Time: 0 hours 10 minutes 55 seconds  Total Procedure Duration: 0 hours 16 minutes 16 seconds  Findings:                 The perianal and digital rectal examinations were                            normal.                           Two sessile polyps were found in the descending                            colon. The polyps were 3 to 4 mm in size. These                            polyps were removed with a cold snare. Resection                            and retrieval were complete. Estimated blood loss                            was minimal.  The exam was otherwise normal throughout the                            remainder of the colon.                           Non-bleeding internal hemorrhoids were found during                            retroflexion. The hemorrhoids were small.                           The terminal ileum appeared normal. Complications:            No immediate complications. Estimated Blood Loss:     Estimated blood loss was minimal. Impression:               - Two 3 to 4 mm polyps in the descending colon,                            removed with a cold snare. Resected and retrieved.                           - Non-bleeding internal hemorrhoids.                           - The examined portion of the ileum was normal. Recommendation:           - Patient has a contact number available for                            emergencies. The signs and symptoms of potential                            delayed complications  were discussed with the                            patient. Return to normal activities tomorrow.                            Written discharge instructions were provided to the                            patient.                           - Resume previous diet.                           - Continue present medications.                           - Await pathology results.                           - Repeat colonoscopy for surveillance based on  pathology results.                           - Return to GI office PRN. Sandor Flatter, MD 12/10/2024 9:55:26 AM

## 2024-12-10 NOTE — Progress Notes (Signed)
 Vss nad trans to pacu

## 2024-12-11 ENCOUNTER — Telehealth: Payer: Self-pay | Admitting: *Deleted

## 2024-12-11 NOTE — Telephone Encounter (Signed)
 No answer for follow up. Left a message.

## 2024-12-12 LAB — SURGICAL PATHOLOGY

## 2024-12-19 ENCOUNTER — Ambulatory Visit: Payer: Self-pay | Admitting: Gastroenterology

## 2025-02-27 ENCOUNTER — Ambulatory Visit (HOSPITAL_BASED_OUTPATIENT_CLINIC_OR_DEPARTMENT_OTHER): Admitting: Family Medicine
